# Patient Record
Sex: Female | Born: 1970 | Race: Black or African American | Hispanic: No | Marital: Single | Smoking: Never smoker
Health system: Southern US, Community
[De-identification: ages and names within clinical notes are randomized; demographics above are authoritative.]

## PROBLEM LIST (undated history)

## (undated) DIAGNOSIS — K589 Irritable bowel syndrome without diarrhea: Secondary | ICD-10-CM

## (undated) DIAGNOSIS — M069 Rheumatoid arthritis, unspecified: Secondary | ICD-10-CM

## (undated) DIAGNOSIS — I10 Essential (primary) hypertension: Secondary | ICD-10-CM

## (undated) DIAGNOSIS — E039 Hypothyroidism, unspecified: Secondary | ICD-10-CM

## (undated) DIAGNOSIS — R252 Cramp and spasm: Secondary | ICD-10-CM

## (undated) DIAGNOSIS — E669 Obesity, unspecified: Secondary | ICD-10-CM

## (undated) DIAGNOSIS — M722 Plantar fascial fibromatosis: Secondary | ICD-10-CM

## (undated) DIAGNOSIS — J45909 Unspecified asthma, uncomplicated: Secondary | ICD-10-CM

## (undated) DIAGNOSIS — T7840XA Allergy, unspecified, initial encounter: Secondary | ICD-10-CM

## (undated) DIAGNOSIS — E559 Vitamin D deficiency, unspecified: Secondary | ICD-10-CM

## (undated) DIAGNOSIS — K219 Gastro-esophageal reflux disease without esophagitis: Secondary | ICD-10-CM

## (undated) DIAGNOSIS — G43909 Migraine, unspecified, not intractable, without status migrainosus: Secondary | ICD-10-CM

## (undated) DIAGNOSIS — D649 Anemia, unspecified: Secondary | ICD-10-CM

## (undated) HISTORY — DX: Unspecified asthma, uncomplicated: J45.909

## (undated) HISTORY — DX: Plantar fascial fibromatosis: M72.2

## (undated) HISTORY — DX: Obesity, unspecified: E66.9

## (undated) HISTORY — DX: Essential (primary) hypertension: I10

## (undated) HISTORY — DX: Irritable bowel syndrome, unspecified: K58.9

## (undated) HISTORY — DX: Allergy, unspecified, initial encounter: T78.40XA

## (undated) HISTORY — DX: Rheumatoid arthritis, unspecified: M06.9

## (undated) HISTORY — DX: Cramp and spasm: R25.2

## (undated) HISTORY — DX: Migraine, unspecified, not intractable, without status migrainosus: G43.909

## (undated) HISTORY — DX: Anemia, unspecified: D64.9

## (undated) HISTORY — DX: Vitamin D deficiency, unspecified: E55.9

---

## 1998-06-14 ENCOUNTER — Other Ambulatory Visit: Admission: RE | Admit: 1998-06-14 | Discharge: 1998-06-14 | Payer: Self-pay | Admitting: Obstetrics & Gynecology

## 1999-06-12 ENCOUNTER — Other Ambulatory Visit: Admission: RE | Admit: 1999-06-12 | Discharge: 1999-06-12 | Payer: Self-pay | Admitting: Obstetrics and Gynecology

## 2000-06-11 ENCOUNTER — Other Ambulatory Visit: Admission: RE | Admit: 2000-06-11 | Discharge: 2000-06-11 | Payer: Self-pay | Admitting: *Deleted

## 2001-06-28 ENCOUNTER — Other Ambulatory Visit: Admission: RE | Admit: 2001-06-28 | Discharge: 2001-06-28 | Payer: Self-pay | Admitting: Obstetrics and Gynecology

## 2002-06-30 ENCOUNTER — Other Ambulatory Visit: Admission: RE | Admit: 2002-06-30 | Discharge: 2002-06-30 | Payer: Self-pay | Admitting: Obstetrics and Gynecology

## 2003-01-10 ENCOUNTER — Encounter: Payer: Self-pay | Admitting: Family Medicine

## 2003-01-10 ENCOUNTER — Encounter: Admission: RE | Admit: 2003-01-10 | Discharge: 2003-01-10 | Payer: Self-pay | Admitting: Family Medicine

## 2003-07-02 ENCOUNTER — Other Ambulatory Visit: Admission: RE | Admit: 2003-07-02 | Discharge: 2003-07-02 | Payer: Self-pay | Admitting: Obstetrics and Gynecology

## 2004-07-07 ENCOUNTER — Other Ambulatory Visit: Admission: RE | Admit: 2004-07-07 | Discharge: 2004-07-07 | Payer: Self-pay | Admitting: Obstetrics and Gynecology

## 2005-07-14 ENCOUNTER — Other Ambulatory Visit: Admission: RE | Admit: 2005-07-14 | Discharge: 2005-07-14 | Payer: Self-pay | Admitting: Family Medicine

## 2006-07-26 ENCOUNTER — Other Ambulatory Visit: Admission: RE | Admit: 2006-07-26 | Discharge: 2006-07-26 | Payer: Self-pay | Admitting: Family Medicine

## 2007-07-29 ENCOUNTER — Other Ambulatory Visit: Admission: RE | Admit: 2007-07-29 | Discharge: 2007-07-29 | Payer: Self-pay | Admitting: Family Medicine

## 2008-08-10 ENCOUNTER — Other Ambulatory Visit: Admission: RE | Admit: 2008-08-10 | Discharge: 2008-08-10 | Payer: Self-pay | Admitting: Family Medicine

## 2009-08-12 ENCOUNTER — Other Ambulatory Visit: Admission: RE | Admit: 2009-08-12 | Discharge: 2009-08-12 | Payer: Self-pay | Admitting: Family Medicine

## 2011-10-26 ENCOUNTER — Other Ambulatory Visit: Payer: Self-pay | Admitting: Family Medicine

## 2011-10-26 DIAGNOSIS — Z1231 Encounter for screening mammogram for malignant neoplasm of breast: Secondary | ICD-10-CM

## 2011-10-29 ENCOUNTER — Ambulatory Visit: Payer: Self-pay

## 2012-03-21 ENCOUNTER — Ambulatory Visit
Admission: RE | Admit: 2012-03-21 | Discharge: 2012-03-21 | Disposition: A | Discharge status: Home / Self Care | Payer: PRIVATE HEALTH INSURANCE | Source: Ambulatory Visit | Attending: Family Medicine | Admitting: Family Medicine

## 2012-03-21 DIAGNOSIS — Z1231 Encounter for screening mammogram for malignant neoplasm of breast: Secondary | ICD-10-CM

## 2012-08-22 ENCOUNTER — Other Ambulatory Visit (HOSPITAL_COMMUNITY)
Admission: RE | Admit: 2012-08-22 | Discharge: 2012-08-22 | Disposition: A | Discharge status: Home / Self Care | Payer: PRIVATE HEALTH INSURANCE | Source: Ambulatory Visit | Attending: Family Medicine | Admitting: Family Medicine

## 2012-08-22 ENCOUNTER — Other Ambulatory Visit: Payer: Self-pay | Admitting: Family Medicine

## 2012-08-22 DIAGNOSIS — Z0000 Encounter for general adult medical examination without abnormal findings: Secondary | ICD-10-CM | POA: Insufficient documentation

## 2013-10-09 ENCOUNTER — Other Ambulatory Visit: Payer: Self-pay

## 2013-10-09 DIAGNOSIS — Z1231 Encounter for screening mammogram for malignant neoplasm of breast: Secondary | ICD-10-CM

## 2014-02-19 ENCOUNTER — Other Ambulatory Visit: Payer: Self-pay | Admitting: Orthopedic Surgery

## 2014-02-19 DIAGNOSIS — M25511 Pain in right shoulder: Secondary | ICD-10-CM

## 2014-02-25 ENCOUNTER — Other Ambulatory Visit: Payer: PRIVATE HEALTH INSURANCE

## 2014-04-02 ENCOUNTER — Ambulatory Visit: Payer: PRIVATE HEALTH INSURANCE

## 2014-04-02 ENCOUNTER — Ambulatory Visit
Admission: RE | Admit: 2014-04-02 | Discharge: 2014-04-02 | Disposition: A | Discharge status: Home / Self Care | Payer: Self-pay | Source: Ambulatory Visit

## 2014-04-02 DIAGNOSIS — Z1231 Encounter for screening mammogram for malignant neoplasm of breast: Secondary | ICD-10-CM

## 2014-08-29 ENCOUNTER — Other Ambulatory Visit: Payer: Self-pay | Admitting: Family Medicine

## 2014-08-31 LAB — CYTOLOGY - PAP

## 2015-02-22 ENCOUNTER — Ambulatory Visit: Payer: Self-pay | Admitting: Physician Assistant

## 2015-03-12 ENCOUNTER — Ambulatory Visit: Payer: Self-pay

## 2015-03-19 ENCOUNTER — Ambulatory Visit: Payer: Self-pay

## 2015-03-22 ENCOUNTER — Ambulatory Visit (INDEPENDENT_AMBULATORY_CARE_PROVIDER_SITE_OTHER): Payer: PRIVATE HEALTH INSURANCE

## 2015-03-22 ENCOUNTER — Ambulatory Visit (INDEPENDENT_AMBULATORY_CARE_PROVIDER_SITE_OTHER): Payer: PRIVATE HEALTH INSURANCE | Admitting: Podiatry

## 2015-03-22 ENCOUNTER — Encounter: Payer: Self-pay | Admitting: Podiatry

## 2015-03-22 VITALS — BP 140/82 | HR 115 | Resp 16 | Ht 70.0 in | Wt 384.0 lb

## 2015-03-22 DIAGNOSIS — M722 Plantar fascial fibromatosis: Secondary | ICD-10-CM | POA: Diagnosis not present

## 2015-03-22 DIAGNOSIS — S9032XA Contusion of left foot, initial encounter: Secondary | ICD-10-CM

## 2015-03-22 MED ORDER — TRIAMCINOLONE ACETONIDE 10 MG/ML IJ SUSP
10.0000 mg | Freq: Once | INTRAMUSCULAR | Status: AC
Start: 2015-03-22 — End: 2015-03-22
  Administered 2015-03-22: 10 mg
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
## 2015-03-26 ENCOUNTER — Ambulatory Visit: Payer: Self-pay

## 2015-04-12 ENCOUNTER — Encounter: Payer: Self-pay | Admitting: Podiatry

## 2015-04-12 ENCOUNTER — Ambulatory Visit (INDEPENDENT_AMBULATORY_CARE_PROVIDER_SITE_OTHER): Payer: PRIVATE HEALTH INSURANCE | Admitting: Podiatry

## 2015-04-12 ENCOUNTER — Ambulatory Visit: Payer: Self-pay | Admitting: Podiatry

## 2015-04-12 VITALS — BP 136/74 | HR 86 | Resp 12

## 2015-04-12 DIAGNOSIS — S9032XA Contusion of left foot, initial encounter: Secondary | ICD-10-CM | POA: Diagnosis not present

## 2015-04-12 DIAGNOSIS — R609 Edema, unspecified: Secondary | ICD-10-CM

## 2015-04-12 DIAGNOSIS — M722 Plantar fascial fibromatosis: Secondary | ICD-10-CM
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
## 2015-05-17 ENCOUNTER — Ambulatory Visit (INDEPENDENT_AMBULATORY_CARE_PROVIDER_SITE_OTHER): Payer: PRIVATE HEALTH INSURANCE

## 2015-05-17 ENCOUNTER — Ambulatory Visit (INDEPENDENT_AMBULATORY_CARE_PROVIDER_SITE_OTHER): Payer: PRIVATE HEALTH INSURANCE | Admitting: Podiatry

## 2015-05-17 ENCOUNTER — Encounter: Payer: Self-pay | Admitting: Podiatry

## 2015-05-17 VITALS — BP 154/96 | HR 117 | Resp 15

## 2015-05-17 DIAGNOSIS — M722 Plantar fascial fibromatosis: Secondary | ICD-10-CM

## 2015-05-17 DIAGNOSIS — M779 Enthesopathy, unspecified: Secondary | ICD-10-CM | POA: Diagnosis not present

## 2015-05-17 DIAGNOSIS — S9032XA Contusion of left foot, initial encounter: Secondary | ICD-10-CM | POA: Diagnosis not present

## 2015-05-17 MED ORDER — TRIAMCINOLONE ACETONIDE 10 MG/ML IJ SUSP
10.0000 mg | Freq: Once | INTRAMUSCULAR | Status: AC
Start: 2015-05-17 — End: 2015-05-17
  Administered 2015-05-17: 10 mg

## 2015-05-17 NOTE — Patient Instructions (Signed)
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
## 2015-06-05 ENCOUNTER — Telehealth: Payer: Self-pay | Admitting: *Deleted

## 2015-06-05 NOTE — Telephone Encounter (Signed)
Pt states Dr. Paulla Dolly instructed her to gradually transfer into regular shoes with the orthotics, but she is still having a great deal of pain.  I instructed pt to wear the boot while outside of the house, and wear the orthotics in her shoes while at home, so if she needed to rest from the orthotics she would be able to sit.  I encouraged pt to ice the area 3 - 4 times a day for 10 - 15 minutes/daily and to call for a follow up appt if needed and to bring her orthotics.

## 2015-06-19 ENCOUNTER — Encounter: Payer: Self-pay | Admitting: Podiatry

## 2015-06-19 ENCOUNTER — Ambulatory Visit (INDEPENDENT_AMBULATORY_CARE_PROVIDER_SITE_OTHER): Payer: PRIVATE HEALTH INSURANCE | Admitting: Podiatry

## 2015-06-19 VITALS — BP 137/80 | HR 114 | Resp 15

## 2015-06-19 DIAGNOSIS — M722 Plantar fascial fibromatosis: Secondary | ICD-10-CM

## 2015-06-19 MED ORDER — TRIAMCINOLONE ACETONIDE 10 MG/ML IJ SUSP
10.0000 mg | Freq: Once | INTRAMUSCULAR | Status: AC
  Administered 2015-06-19: 10 mg

## 2015-06-19 MED ORDER — TRAMADOL HCL 50 MG PO TABS: 50.0000 mg | ORAL_TABLET | Freq: Three times a day (TID) | ORAL | Status: DC

## 2015-06-20 ENCOUNTER — Ambulatory Visit: Payer: Self-pay | Admitting: Podiatry

## 2015-06-21 ENCOUNTER — Ambulatory Visit: Payer: PRIVATE HEALTH INSURANCE | Admitting: Podiatry
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
## 2015-06-26 ENCOUNTER — Telehealth: Payer: Self-pay | Admitting: *Deleted

## 2015-06-26 NOTE — Telephone Encounter (Signed)
Pt request rx for shoes to accommodate her rx orthotic.  Dr. Paulla Dolly states pt is to wear orthotics in accommodating shoes for the functional support of her feet. Please dispense one pair of accommodating shoes.  Mailed to pt's home address.

## 2015-08-05 ENCOUNTER — Encounter: Payer: Self-pay | Admitting: Podiatry

## 2015-08-05 ENCOUNTER — Ambulatory Visit (INDEPENDENT_AMBULATORY_CARE_PROVIDER_SITE_OTHER): Payer: PRIVATE HEALTH INSURANCE | Admitting: Podiatry

## 2015-08-05 VITALS — BP 130/65 | HR 107 | Resp 16

## 2015-08-05 DIAGNOSIS — M722 Plantar fascial fibromatosis: Secondary | ICD-10-CM

## 2015-08-05 MED ORDER — TRIAMCINOLONE ACETONIDE 10 MG/ML IJ SUSP
10.0000 mg | Freq: Once | INTRAMUSCULAR | Status: AC
  Administered 2015-08-05: 10 mg

## 2015-08-05 NOTE — Patient Instructions (Signed)
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
## 2015-09-25 ENCOUNTER — Telehealth: Payer: Self-pay | Admitting: *Deleted

## 2015-09-25 DIAGNOSIS — M722 Plantar fascial fibromatosis: Secondary | ICD-10-CM

## 2015-09-25 NOTE — Telephone Encounter (Addendum)
Pt states she would like to try PT prior to considering surgery, her foot is killing her.  Dr. Mellody Drown orders called to pt and she prefers [facility] [phone], and fax [phone].  Pt states she can only go once a week, Dr. Paulla Dolly ordered 10 sessions.  Faxed to [facility].

## 2015-09-26 ENCOUNTER — Telehealth: Payer: Self-pay | Admitting: *Deleted

## 2015-09-26 NOTE — Telephone Encounter (Signed)
No problem. They can try ultrasound and ionopheresis where ever she wants to go

## 2015-09-26 NOTE — Telephone Encounter (Signed)
Faxed to [facility].

## 2015-10-04 ENCOUNTER — Other Ambulatory Visit: Payer: Self-pay | Admitting: Rheumatology

## 2015-10-04 DIAGNOSIS — M79672 Pain in left foot: Secondary | ICD-10-CM

## 2016-01-03 ENCOUNTER — Telehealth: Payer: Self-pay | Admitting: *Deleted

## 2016-01-03 NOTE — Telephone Encounter (Addendum)
Pt states Dr. Paulla Dolly said at the last visit that would be her last shot.  Pt states she does not want to come in if she can't get a shot.  I told pt I would ask Dr. Paulla Dolly on Monday and call again. 01/06/2016 - CALLED PT informed pt of Dr. Mellody Drown statement that he could possibly use a different injection site.  Pt states understanding and I transferred to schedulers.

## 2016-01-06 NOTE — Telephone Encounter (Signed)
I may be able to utilitze a different injection site. If still hurting I should see her

## 2016-01-13 ENCOUNTER — Ambulatory Visit (INDEPENDENT_AMBULATORY_CARE_PROVIDER_SITE_OTHER): Payer: Managed Care, Other (non HMO) | Admitting: Podiatry

## 2016-01-13 ENCOUNTER — Encounter: Payer: Self-pay | Admitting: Podiatry

## 2016-01-13 VITALS — BP 144/77 | HR 117 | Resp 16

## 2016-01-13 DIAGNOSIS — M722 Plantar fascial fibromatosis: Secondary | ICD-10-CM | POA: Diagnosis not present

## 2016-01-13 MED ORDER — TRIAMCINOLONE ACETONIDE 10 MG/ML IJ SUSP
10.0000 mg | Freq: Once | INTRAMUSCULAR | Status: AC
  Administered 2016-01-13: 10 mg

## 2016-01-14 NOTE — Progress Notes (Signed)
Subjective:     Patient ID: Brittany Riggs, female   DOB: [birth year], 45 y.o.   MRN: [id number]  HPI patient continues to experience pain in the plantar aspect left heel and states that it just doesn't seem to get better   Review of Systems     Objective:   Physical Exam Neurovascular status intact with extreme obesity and history of injury that has exacerbated a plantar fascial inflammation left heel with depression of the arch noted    Assessment:     Continued acute plantar fasciitis left    Plan:     Reviewed that we'll get a try everything but ultimately this may require shockwave or surgery and today I did careful last time medial injection of the plantar fascia and advised on night splint usage boot usage ice therapy oral anti-inflammatories and reduced activity.

## 2016-02-10 ENCOUNTER — Ambulatory Visit: Payer: Managed Care, Other (non HMO) | Admitting: Podiatry

## 2016-03-12 ENCOUNTER — Encounter: Payer: Self-pay | Admitting: Internal Medicine

## 2016-03-12 ENCOUNTER — Ambulatory Visit (INDEPENDENT_AMBULATORY_CARE_PROVIDER_SITE_OTHER): Payer: Managed Care, Other (non HMO) | Admitting: Internal Medicine

## 2016-03-12 ENCOUNTER — Ambulatory Visit (INDEPENDENT_AMBULATORY_CARE_PROVIDER_SITE_OTHER)
Admission: RE | Admit: 2016-03-12 | Discharge: 2016-03-12 | Disposition: A | Discharge status: Home / Self Care | Payer: Managed Care, Other (non HMO) | Source: Ambulatory Visit | Attending: Internal Medicine | Admitting: Internal Medicine

## 2016-03-12 ENCOUNTER — Encounter (INDEPENDENT_AMBULATORY_CARE_PROVIDER_SITE_OTHER): Payer: Self-pay

## 2016-03-12 VITALS — BP 124/84 | HR 122 | Ht 70.0 in | Wt >= 6400 oz

## 2016-03-12 DIAGNOSIS — R05 Cough: Secondary | ICD-10-CM

## 2016-03-12 DIAGNOSIS — R058 Other specified cough: Secondary | ICD-10-CM

## 2016-03-12 DIAGNOSIS — J45991 Cough variant asthma: Secondary | ICD-10-CM

## 2016-03-12 MED ORDER — TRAMADOL HCL 50 MG PO TABS: ORAL_TABLET | ORAL | Status: DC

## 2016-03-12 MED ORDER — MONTELUKAST SODIUM 10 MG PO TABS: 10.0000 mg | ORAL_TABLET | Freq: Every day | ORAL | Status: DC

## 2016-03-12 NOTE — Assessment & Plan Note (Addendum)
The most common causes of chronic cough in immunocompetent adults include the following: upper airway cough syndrome (UACS), previously referred to as postnasal drip syndrome (PNDS), which is caused by variety of rhinosinus conditions; (2) asthma; (3) GERD; (4) chronic bronchitis from cigarette smoking or other inhaled environmental irritants; (5) nonasthmatic eosinophilic bronchitis; and (6) bronchiectasis.   These conditions, singly or in combination, have accounted for up to 94% of the causes of chronic cough in prospective studies.   Other conditions have constituted no >6% of the causes in prospective studies These have included bronchogenic carcinoma, chronic interstitial pneumonia, sarcoidosis, left ventricular failure, ACEI-induced cough, and aspiration from a condition associated with pharyngeal dysfunction.    Chronic cough is often simultaneously caused by more than one condition. A single cause has been found from 38 to 82% of the time, multiple causes from 18 to 62%. Multiply caused cough has been the result of three diseases up to 42% of the time.       Based on hx and exam, this is most likely:  Classic Upper airway cough syndrome, so named because it's frequently impossible to sort out how much is  CR/sinusitis with freq throat clearing (which can be related to primary GERD)   vs  causing  secondary (" extra esophageal")  GERD from wide swings in gastric pressure that occur with throat clearing, often  promoting self use of mint and menthol lozenges that reduce the lower esophageal sphincter tone and exacerbate the problem further in a cyclical fashion.   These are the same pts (now being labeled as having "irritable larynx syndrome" by some cough centers) who not infrequently have a history of having failed to tolerate ace inhibitors,  dry powder inhalers or biphosphonates or report having atypical reflux symptoms that don't respond to standard doses of PPI , and are easily confused as  having aecopd or asthma flares by even experienced allergists/ pulmonologists.   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 Therefore if adherence is an issue or can't be accurately verified,  it's very unlikely the standard evaluation and treatment will be successful here.    Furthermore, response to therapy (other than acute cough suppression, which should only be used short term with avoidance of narcotic containing cough syrups if possible), can be a gradual process for which the patient may perceive immediate benefit.  Unlike going to an eye doctor where the best perscription is almost always the first one and is immediately effective, this is almost never the case in the management of chronic cough syndromes. Therefore the patient needs to commit up front to consistently adhere to recommendations  for up to 6 weeks of therapy directed at the likely underlying problem(s) before the response can be reasonably evaluated.  2) Each maintenance medication was reviewed in detail including most importantly the difference between maintenance and prns and under what circumstances the prns are to be triggered using an action plan format that is not reflected in the computer generated  alphabetically organized AVS.    Please see instructions for details which were reviewed in writing and the patient given a copy highlighting the part that I personally wrote and discussed at today's ov.   See instructions for specific recommendations which were reviewed directly with the patient who was given a copy with highlighter outlining the key components.
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
## 2016-03-12 NOTE — Patient Instructions (Addendum)
Stop cough syrup and all inhalers except for proair and only if can't catch your breath  Continue singulair 10 mg daily    The key to effective treatment for your cough is eliminating the non-stop cycle of cough you're stuck in long enough to let your airway heal completely and then see if there is anything still making you cough once you stop the cough suppression, but this should take no more than 5 days to figure out  First take delsym two tsp every 12 hours and supplement if needed with  tramadol 50 mg up to 2 every 4 hours to suppress the urge to cough at all or even clear your throat. Swallowing water or using ice chips/non mint and menthol containing candies (such as lifesavers or sugarless jolly ranchers) are also effective.  You should rest your voice and avoid activities that you know make you cough.  Once you have eliminated the cough for 3 straight days try reducing the tramadol first,  then the delsym as tolerated.    Prednisone  Per rheumatology   Omeprazole 40 mg  Take 30- 60 min before your first and last meals of the day  and Pepcid 20 mg one bedtime plus chlorpheniramine 4 mg x 2 at bedtime (both available over the counter)  until cough is completely gone for at least a week without the need for cough suppression  GERD (REFLUX)  is an extremely common cause of respiratory symptoms, many times with no significant heartburn at all.    It can be treated with medication, but also with lifestyle changes including avoidance of late meals, excessive alcohol, smoking cessation, and avoid fatty foods, chocolate, peppermint, colas, red wine, and acidic juices such as orange juice.  NO MINT OR MENTHOL PRODUCTS SO NO COUGH DROPS  USE HARD CANDY INSTEAD (jolley ranchers or Stover's or Lifesavers (all available in sugarless versions) NO OIL BASED VITAMINS - use powdered substitutes.  Please remember to go to the  x-ray department downstairs for your tests - we will call you with the results  when they are available.  Please schedule a follow up office visit in 2  weeks, sooner if needed

## 2016-03-13 ENCOUNTER — Telehealth: Payer: Self-pay | Admitting: Internal Medicine

## 2016-03-13 DIAGNOSIS — J45991 Cough variant asthma: Secondary | ICD-10-CM | POA: Insufficient documentation

## 2016-03-13 NOTE — Assessment & Plan Note (Signed)
Also in ddx though note neg resp to saba/laba/ics/steroids and cough on insp , not exp  Only meds for asthma that don't potentially aggravate uacs are singulair and prednisone so reasonable to resume singulair for now and take the pred taper already rx per rheum then regroup in 2 weeks  She can still use saba as rescue for breathing/ not for coughing.

## 2016-03-13 NOTE — Assessment & Plan Note (Signed)
Body mass index is 57.62    No results found for: TSH   Contributing to gerd tendency especially with bcp's in use/ doe/reviewed the need and the process to achieve and maintain neg calorie balance > defer f/u primary care including intermittently monitoring thyroid status

## 2016-03-13 NOTE — Telephone Encounter (Signed)
Per CXR result note: Result Note     Call pt: Reviewed cxr and no acute change so no change in recommendations made at ov  --  I spoke with patient about results and she verbalized understanding and had no questions

## 2016-03-19 ENCOUNTER — Ambulatory Visit (INDEPENDENT_AMBULATORY_CARE_PROVIDER_SITE_OTHER): Payer: Managed Care, Other (non HMO)

## 2016-03-19 ENCOUNTER — Ambulatory Visit (INDEPENDENT_AMBULATORY_CARE_PROVIDER_SITE_OTHER): Payer: Managed Care, Other (non HMO) | Admitting: Podiatry

## 2016-03-19 ENCOUNTER — Encounter: Payer: Self-pay | Admitting: Podiatry

## 2016-03-19 DIAGNOSIS — R600 Localized edema: Secondary | ICD-10-CM

## 2016-03-19 DIAGNOSIS — M79672 Pain in left foot: Secondary | ICD-10-CM

## 2016-03-20 NOTE — Progress Notes (Signed)
Subjective:     Patient ID: Brittany Riggs, female   DOB: 1971-01-12, 45 y.o.   MRN: [id number]  HPI extremely obese female presents with forefoot pain stating her heel seems to feel somewhat better but this is making it very hard for her to walk   Review of Systems     Objective:   Physical Exam Neurovascular status unchanged with edema in the forefoot left and into the ankle with negative Homans sign noted with patient having pain in the forefoot and moderate pain and he'll also still noted    Assessment:     Forefoot pain heel pain with edema present    Plan:     X-ray reviewed and today we can reduce the edema and I applied Unna boot surgical shoe and Ace wrap to help reduce edema. Reappoint for [place] to recheck again in 1 week or earlier if needed and she's instructed take this off at home in the next 3-4 days  X-ray report indicates no signs of stress fracture or arthritic process

## 2016-03-23 ENCOUNTER — Ambulatory Visit: Payer: Managed Care, Other (non HMO) | Admitting: Podiatry

## 2016-03-26 ENCOUNTER — Ambulatory Visit (INDEPENDENT_AMBULATORY_CARE_PROVIDER_SITE_OTHER): Payer: Managed Care, Other (non HMO) | Admitting: Podiatry

## 2016-03-26 ENCOUNTER — Encounter: Payer: Self-pay | Admitting: Podiatry

## 2016-03-26 ENCOUNTER — Ambulatory Visit (INDEPENDENT_AMBULATORY_CARE_PROVIDER_SITE_OTHER): Payer: Managed Care, Other (non HMO) | Admitting: Internal Medicine

## 2016-03-26 ENCOUNTER — Encounter: Payer: Self-pay | Admitting: Internal Medicine

## 2016-03-26 VITALS — BP 134/80 | HR 120 | Ht 70.0 in | Wt >= 6400 oz

## 2016-03-26 VITALS — BP 131/80 | HR 100 | Resp 12

## 2016-03-26 DIAGNOSIS — R058 Other specified cough: Secondary | ICD-10-CM

## 2016-03-26 DIAGNOSIS — J45991 Cough variant asthma: Secondary | ICD-10-CM | POA: Diagnosis not present

## 2016-03-26 DIAGNOSIS — R05 Cough: Secondary | ICD-10-CM

## 2016-03-26 DIAGNOSIS — M779 Enthesopathy, unspecified: Secondary | ICD-10-CM | POA: Diagnosis not present

## 2016-03-26 DIAGNOSIS — R600 Localized edema: Secondary | ICD-10-CM | POA: Diagnosis not present

## 2016-03-26 MED ORDER — METHYLPREDNISOLONE ACETATE 80 MG/ML IJ SUSP
120.0000 mg | Freq: Once | INTRAMUSCULAR | Status: AC
  Administered 2016-03-26: 120 mg via INTRAMUSCULAR

## 2016-03-26 MED ORDER — TRAMADOL HCL 50 MG PO TABS: ORAL_TABLET | ORAL | Status: DC

## 2016-03-26 MED ORDER — TRIAMCINOLONE ACETONIDE 10 MG/ML IJ SUSP
10.0000 mg | Freq: Once | INTRAMUSCULAR | Status: AC
  Administered 2016-03-26: 10 mg

## 2016-03-26 MED ORDER — MOMETASONE FURO-FORMOTEROL FUM 100-5 MCG/ACT IN AERO: INHALATION_SPRAY | RESPIRATORY_TRACT | Status: DC

## 2016-03-26 MED ORDER — FLUTTER DEVI: Status: AC

## 2016-03-26 NOTE — Progress Notes (Signed)
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 Helped but then around first Feb 2017 started wheezing and then  acutely ill with severe cough first week of cough and worse sob >  referred to pulmonary clinic 03/12/2016 by Dr Dema Severin   History of Present Illness  03/12/2016 1st [facility] office visit/ Wert   Chief Complaint  Patient presents with  . Pulmonary Consult    Referred by Dr. [name]. Pt states started wheezing 2 months ago. She began coughing 3 wks ago and was dxed with sinus infection.  Her cough is prod with minimal yellow sputum.  She is also having DOE with walking very short distances such as from lobby to exam room today.     Last fine 3 months prior to OV  While on No saba/ occ symbicort/ daily clariton and omeprazole 20 mg daily plus bcps but since 2 months prior to OV  subj wheeze/ sob/ cough only a little better with pred and not better with symbicort or dulera Coughs so hard gags/vomits/ urinates worse day than noct changed to dulera 200 one week prior to OV no better / also no better on codeine cough med and presently completing course of levaquin rec Stop cough syrup and all inhalers except for proair and only if can't catch your breath Continue singulair 10 mg daily  First take delsym two tsp every 12 hours and supplement if needed with  tramadol 50 mg up to 2 every 4 hours to suppress the urge to cough  Once you have eliminated the cough for 3 straight days try reducing the tramadol  first,  then the delsym as tolerated.   Prednisone  Per rheumatology  Omeprazole 40 mg  Take 30- 60 min before your first and last meals of the day  and Pepcid 20 mg one bedtime plus chlorpheniramine 4 mg x 2 at bedtime (both available over the counter)  until cough is completely gone for at least a week without the need for cough suppression GERD diet      03/26/2016  f/u ov/Wert re: refractory cough since early Feb 2017  Chief Complaint  Patient presents with  . Follow-up    Cough has improved minimally- prod with min yellow sputum.  Her breathing has not improved. She is still wheezing.   off pred x 10 days/ avg tramadol maybe 2 total per 24 h/ back on dulera 200 but not helping/ still cough to gag/vomit  No obvious day to day or daytime variability or assoc  cp or chest tightness, subjective wheeze or overt sinus   symptoms. No unusual exp hx or h/o childhood pna/ asthma or knowledge of premature birth.  Sleeping ok p tramadol without nocturnal  or early am exacerbation  of respiratory  c/o's or need for noct saba. Also denies any obvious fluctuation of symptoms with weather or environmental changes or other aggravating or alleviating factors except as outlined above   Current Medications, Allergies,  Complete Past Medical History, Past Surgical History, Family History, and Social History were reviewed in [employer] record.  ROS  The following are not active complaints unless bolded sore throat, dysphagia, dental problems, itching, sneezing,  nasal congestion or excess/ purulent secretions, ear ache,   fever, chills, sweats, unintended wt loss, classically pleuritic or exertional cp, hemoptysis,  orthopnea pnd or leg swelling, presyncope, palpitations, abdominal pain, anorexia, nausea, vomiting, diarrhea  or change in bowel or bladder habits, change in stools or urine, dysuria,hematuria,  rash, arthralgias, visual complaints, headache, numbness, weakness or ataxia or  problems with walking or coordination,  change in mood/affect or memory.                        Objective:   Physical Exam  Massively obese amb bf extremely harsh  upper airway coughing fits  03/26/2016        405   03/12/16 401 lb 9.6 oz (182.165 kg)  03/22/15 384 lb (174.181 kg)    Vital signs reviewed  HEENT: nl dentition, turbinates, and oropharynx. Nl external ear canals without cough reflex   NECK :  without JVD/Nodes/TM/ nl carotid upstrokes bilaterally   LUNGS: no acc muscle use,  Nl contour chest which is clear to A and P bilaterally with  cough only on  insp  maneuvers   CV:  RRR  no s3 or murmur or increase in P2, no edema   ABD:  soft and nontender with nl inspiratory excursion in the supine position. No bruits or organomegaly, bowel sounds nl  MS:  Nl gait/ ext warm without deformities, calf tenderness, cyanosis or clubbing No obvious joint restrictions   SKIN: warm and dry without lesions    NEURO:  alert, approp, nl sensorium with  no motor deficits    CXR PA and Lateral:   03/12/2016 :    I personally reviewed images and agree with radiology impression as follows:    slt increased markings/ non specific - no as dz      Assessment & Plan:   Outpatient Encounter Prescriptions as of 03/26/2016  Medication Sig  . Adalimumab 40 MG/0.8ML PNKT Inject into the skin. Reported on 03/12/2016  . albuterol (PROAIR HFA) 108 (90 Base) MCG/ACT inhaler Inhale 2 puffs into the lungs every 6 (six) hours as needed for wheezing or shortness of breath.  . celecoxib (CELEBREX) 200 MG capsule Take 200 mg by mouth 2 (two) times daily.  . chlorpheniramine (CHLOR-TRIMETON) 4 MG tablet Take 4 mg by mouth at bedtime.  Marland Kitchen dextromethorphan (DELSYM) 30 MG/5ML liquid as needed for cough.  . famotidine (PEPCID) 20 MG tablet Take 20 mg by mouth at bedtime.  Marland Kitchen loratadine (CLARITIN) 10 MG tablet Take 10 mg by mouth daily.  . montelukast (SINGULAIR) 10 MG tablet Take 1 tablet (10 mg  total) by mouth at bedtime.  Marland Kitchen omeprazole (PRILOSEC) 40 MG capsule Take 40 mg by mouth daily.  . [DISCONTINUED] Mometasone Furo-Formoterol Fum (DULERA IN) Inhale 2 puffs into the lungs daily as needed.  . [DISCONTINUED] traMADol (ULTRAM) 50 MG tablet One every 4 hours as needed for cough  . mometasone-formoterol (DULERA) 100-5 MCG/ACT AERO Take 2 puffs first thing in am and then another 2 puffs about 12 hours later.  Marland Kitchen Respiratory Therapy Supplies (FLUTTER) DEVI Use as directed  . traMADol (ULTRAM) 50 MG tablet 1-2 every 4 hours as needed for cough or pain  . [DISCONTINUED] LevoFLOXacin (LEVAQUIN PO) Take  1 tablet by mouth daily.  . [DISCONTINUED] predniSONE (DELTASONE) 20 MG tablet Take 20 mg by mouth as directed.  . [EXPIRED] methylPREDNISolone acetate (DEPO-MEDROL) injection 120 mg    No facility-administered encounter medications on file as of 03/26/2016.

## 2016-03-26 NOTE — Patient Instructions (Addendum)
dulera 100 Take 2 puffs first thing in am and then another 2 puffs about 12 hours later.   Continue singulair 10 mg daily   To control violent cough > cough into the flutter valve    The key to effective treatment for your cough is eliminating the non-stop cycle of cough you're stuck in long enough to let your airway heal completely and then see if there is anything still making you cough once you stop the cough suppression, but this should take no more than 5 days to figure out  First take delsym two tsp every 12 hours and supplement if needed with  tramadol 50 mg up to 2 every 4 hours to suppress the urge to cough at all or even clear your throat. Swallowing water or using ice chips/non mint and menthol containing candies (such as lifesavers or sugarless jolly ranchers) are also effective.  You should rest your voice and avoid activities that you know make you cough.  Once you have eliminated the cough for 3 straight days try reducing the tramadol first,  then the delsym as tolerated.    Depomedrol 120 mg IM   Omeprazole 40 mg  Take 30- 60 min before your first and last meals of the day  and Pepcid 20 mg one bedtime plus chlorpheniramine 4 mg x 2 at bedtime (both available over the counter)  until cough is completely gone for at least a week without the need for cough suppression  GERD (REFLUX)  is an extremely common cause of respiratory symptoms, many times with no significant heartburn at all.    It can be treated with medication, but also with lifestyle changes including avoidance of late meals, excessive alcohol, smoking cessation, and avoid fatty foods, chocolate, peppermint, colas, red wine, and acidic juices such as orange juice.  NO MINT OR MENTHOL PRODUCTS SO NO COUGH DROPS  USE HARD CANDY INSTEAD (jolley ranchers or Stover's or Lifesavers (all available in sugarless versions) NO OIL BASED VITAMINS - use powdered substitutes.    Please schedule a follow up office visit in 2  weeks, sooner if needed

## 2016-03-27 NOTE — Assessment & Plan Note (Addendum)
Cyclical cough rx [id number] > 03/26/2016 still coughing, using dulera 200/ cough drops and a total of 2 tramadol per day  - 03/26/2016 added flutter valve   I had an extended discussion with the patient reviewing all relevant studies completed to date and  lasting 15 to 20 minutes of a 25 minute visit on the following ongoing concerns:   1) this cough won't stop until it stops due to its cyclical nature:  Cough   initially  triggered by epithelial injury(doesn't really matter what that was at this point)  and a heightened sensitivty to the effects of any upper airway irritants,  most importantly acid - related - then perpetuated by epithelial injury related to the cough itself as the upper airway collapses on itself.  That is, the more sensitive the epithelium becomes once it is damaged by the virus, the more the ensuing irritability> the more the cough, the more the secondary reflux (especially in those prone to reflux) the more the irritation of the sensitive mucosa and so on in a  Classic cyclical pattern.  Key therefore is to stop coughing and only takes 3-5 days if will commit to it and in meantime always cough into the flutter valve   2) unlikely this is cough variant asthma but if it is needs to avoid high dose ics (see separate a/p)   3)  Each maintenance medication was reviewed in detail including most importantly the difference between maintenance and as needed and under what circumstances the prns are to be used.  Please see instructions for details which were reviewed in writing and the patient given a copy.

## 2016-03-27 NOTE — Assessment & Plan Note (Signed)
-   The proper method of use, as well as anticipated side effects, of a metered-dose inhaler are discussed and demonstrated to the patient. Improved effectiveness after extensive coaching during this visit to a level of approximately 75 % from a baseline of 50  %    If there is a component of cough variant asthma best approach is avoid meds that aggravate UACS ie high dose ICS > rec instead continuing singulair and low dose ICS eg dulera 100 2bid

## 2016-03-29 NOTE — Progress Notes (Signed)
Subjective:     Patient ID: Brittany Riggs, female   DOB: 09/16/1971, 45 y.o.   MRN: [id number]  HPI patient continues to experience discomfort in the lateral side of the left foot with moderate improvement from previous treatment with obesity as complicating factor   Review of Systems     Objective:   Physical Exam Neurovascular status unchanged with mild forefoot edema and continued discomfort in the lateral foot with profound obesity is complicating factor    Assessment:     H&P conditions reviewed and explained the tendinitis and condition she is experiencing    Plan:     Careful lateral sheath injection administered 3 mg Kenalog 5 mg Xylocaine and dispensed a ankle compression stocking to try to help control edema. Reappoint as needed

## 2016-04-01 ENCOUNTER — Ambulatory Visit: Payer: Managed Care, Other (non HMO) | Admitting: Podiatry

## 2016-04-09 ENCOUNTER — Telehealth: Payer: Self-pay | Admitting: *Deleted
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
## 2016-04-10 ENCOUNTER — Ambulatory Visit: Payer: Managed Care, Other (non HMO) | Admitting: Podiatry

## 2016-04-13 ENCOUNTER — Ambulatory Visit: Payer: Managed Care, Other (non HMO) | Admitting: Podiatry

## 2016-04-14 ENCOUNTER — Ambulatory Visit: Payer: Self-pay | Admitting: Internal Medicine

## 2016-04-15 ENCOUNTER — Ambulatory Visit (INDEPENDENT_AMBULATORY_CARE_PROVIDER_SITE_OTHER): Payer: Managed Care, Other (non HMO) | Admitting: Adult Health

## 2016-04-15 ENCOUNTER — Encounter: Payer: Self-pay | Admitting: Adult Health

## 2016-04-15 VITALS — BP 130/78 | HR 128 | Temp 98.1°F | Ht 70.0 in | Wt >= 6400 oz

## 2016-04-15 DIAGNOSIS — R058 Other specified cough: Secondary | ICD-10-CM

## 2016-04-15 DIAGNOSIS — R05 Cough: Secondary | ICD-10-CM | POA: Diagnosis not present

## 2016-04-15 DIAGNOSIS — J45991 Cough variant asthma: Secondary | ICD-10-CM

## 2016-04-15 NOTE — Assessment & Plan Note (Signed)
Refractory cough ? Etiology . She is on MTX and has RA . Will check CT chest and sinus to r/o other etiology  Check ESR and IgE /RAST  Continue agrressive cough and trigger control   Plan   Try to add Chlortrimeton 17m in am .  Continue on Chlortrimeton 417m2 At bedtime   Continue on Delsym .As needed  Cough .  Sips of water to avoid cough /throat clearing.  Use tramadol As needed  Cough control .  Change claritin to Zyrtec 1018mn am.  Restart QNASAL daily  GERD diet.  Continue on Prilosec 72m54mily  Continue on Pepcid 72mg70mbedtime   We are setting up CT chest and sinus .  Fllow up Dr. Wert Melvyn Novas4 weeks and As needed   Please contact office for sooner follow up if symptoms do not improve or worsen or seek emergency care

## 2016-04-15 NOTE — Assessment & Plan Note (Signed)
Cont on Dulera ,  Check spirometry once cough is better.  Consider FENO testing on return if cough is better.

## 2016-04-15 NOTE — Progress Notes (Signed)
Chart and office note reviewed in detail  > agree with a/p as outlined    

## 2016-04-15 NOTE — Patient Instructions (Signed)
Try to add Chlortrimeton 4mg  in am .  Continue on Chlortrimeton 4mg  2 At bedtime   Continue on Delsym .As needed  Cough .  Sips of water to avoid cough /throat clearing.  Use tramadol As needed  Cough control .  Change claritin to Zyrtec 10mg  in am.  Restart QNASAL daily  GERD diet.  Continue on Prilosec 20mg  daily  Continue on Pepcid 20mg  At bedtime   We are setting up CT chest and sinus .  Fllow up Dr. Melvyn Novas  In 4 weeks and As needed   Please contact office for sooner follow up if symptoms do not improve or worsen or seek emergency care

## 2016-04-15 NOTE — Progress Notes (Signed)
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 Helped but then around first Feb 2017 started wheezing and then  acutely ill with severe cough first week of cough and worse sob >  referred to pulmonary clinic 03/12/2016 by Dr Dema Severin   History of Present Illness  03/12/2016 1st [facility] office visit/ W[facility]   Chief Complaint  Patient presents with  . Pulmonary Consult    Referred by Dr. [name]. Pt states started wheezing 2 months ago. She began coughing 3 wks ago and was dxed with sinus infection.  Her cough is prod with minimal yellow sputum.  She is also having DOE with walking very short distances such as from lobby to exam room today.     Last fine 3 months prior to OV  While on No saba/ occ symbicort/ daily clariton and omeprazole 20 mg daily plus bcps but since 2 months prior to OV  subj wheeze/ sob/ cough only a little better with pred and not better with symbicort or dulera Coughs so hard gags/vomits/ urinates worse day than noct changed to dulera 200 one week prior to OV no better / also no better on codeine cough med and presently completing course of levaquin rec Stop cough syrup and all inhalers except for proair and only if can't catch your breath Continue singulair 10 mg daily  First take delsym two tsp every 12 hours and supplement if needed with  tramadol 50 mg up to 2 every 4 hours to suppress the urge to cough  Once you have eliminated the cough for 3 straight days try reducing the tramadol  first,  then the delsym as tolerated.   Prednisone  Per rheumatology  Omeprazole 40 mg  Take 30- 60 min before your first and last meals of the day  and Pepcid 20 mg one bedtime plus chlorpheniramine 4 mg x 2 at bedtime (both available over the counter)  until cough is completely gone for at least a week without the need for cough suppression GERD diet      03/26/2016  f/u ov/Wert re: refractory cough since early Feb 2017  Chief Complaint  Patient presents with  . Follow-up    Cough has improved minimally- prod with min yellow sputum.  Her breathing has not improved. She is still wheezing.   off pred x 10 days/ avg tramadol maybe 2 total per 24 h/ back on dulera 200 but not helping/ still cough to gag/vomit >>Depo medrol x 1 , chronic cough control with delsym/tramadol /flutter    04/15/2016 Follow up : Refractory Cough  Returns for a 2 week follow up for refractory cough .  Seen in March for pulmonary consult for refractory cough since Feb that started after URI/sinus infection initially .  Has had 4 abx , 2 steroid tapers and steroid injection . She was started  [facility] pt here for a 3 week follow up. Pt c/o prod cough with occasional yellow mucus and SOB  due to cough. Sinus drainage, ear pain, chest tightness, and wheezing at times. Denies any sinus pressure, fever, nausea or vomiting.  On Humira for RA.  On Methotrexate weekly.  Is on Benicar, does not believe she has been on ACE inhibitor in past.  Currently on delsym, tramadol -mainly taking at night , claritin daily , and Chrlor trimeton At bedtime . Due sleepiness.  Works as English as a second language teacher for years. Some chemical exposure .  Denies hemoptysis , chest pain, orthopnea, edema or fever.      Current Medications, Allergies, Complete Past Medical History, Past Surgical History, Family History, and Social History were reviewed in [facility] record.  ROS  The following are not active complaints unless bolded sore throat,  dysphagia, dental problems, itching, sneezing,  nasal congestion or excess/ purulent secretions, ear ache,   fever, chills, sweats, unintended wt loss, classically pleuritic or exertional cp, hemoptysis,  orthopnea pnd or leg swelling, presyncope, palpitations, abdominal pain, anorexia, nausea, vomiting, diarrhea  or change in bowel or bladder habits, change in stools or urine, dysuria,hematuria,  rash, arthralgias, visual complaints, headache, numbness, weakness or ataxia or problems with walking or coordination,  change in mood/affect or memory.              Objective:   Physical Exam  Massively obese amb bf extremely harsh  upper airway coughing fits Filed Vitals:   04/15/16 0946  BP: 130/78  Pulse: 128  Temp: 98.1 F (36.7 C)  TempSrc: Oral  Height: 5\' 10"  (1.778 m)  Weight: 404 lb (183.253 kg)  SpO2: 98%   Body mass index is 57.97 kg/(m^2).   Vital signs reviewed  HEENT: nl dentition, turbinates, and oropharynx. Nl external ear canals without cough reflex   NECK :  without JVD/Nodes/TM/ nl carotid upstrokes bilaterally   LUNGS: no acc muscle use,  Nl contour chest which is clear to A and P bilaterally with ++barking cough    CV:  RRR  no s3 or murmur or increase in P2, no edema   ABD:  soft and nontender with nl inspiratory excursion in the supine position. No bruits or organomegaly, bowel sounds nl  MS:  Nl gait/ ext warm without deformities, calf tenderness, cyanosis or clubbing No obvious joint restrictions   SKIN: warm and dry without lesions    NEURO:  alert, approp, nl sensorium with  no motor deficits    CXR PA and Lateral:   03/12/2016 :      slt increased markings/ non specific - no as dz     .Tammy Parrett NP-C  [facility]  04/15/2016

## 2016-04-16 ENCOUNTER — Ambulatory Visit: Payer: Managed Care, Other (non HMO) | Admitting: Podiatry

## 2016-04-17 ENCOUNTER — Ambulatory Visit (INDEPENDENT_AMBULATORY_CARE_PROVIDER_SITE_OTHER)
Admission: RE | Admit: 2016-04-17 | Discharge: 2016-04-17 | Disposition: A | Discharge status: Home / Self Care | Payer: Managed Care, Other (non HMO) | Source: Ambulatory Visit | Attending: Adult Health | Admitting: Adult Health

## 2016-04-17 ENCOUNTER — Ambulatory Visit (INDEPENDENT_AMBULATORY_CARE_PROVIDER_SITE_OTHER): Payer: Managed Care, Other (non HMO) | Admitting: Podiatry

## 2016-04-17 ENCOUNTER — Ambulatory Visit: Payer: Self-pay

## 2016-04-17 ENCOUNTER — Encounter: Payer: Self-pay | Admitting: Podiatry

## 2016-04-17 ENCOUNTER — Telehealth: Payer: Self-pay | Admitting: Adult Health

## 2016-04-17 VITALS — BP 131/90 | HR 120 | Resp 16

## 2016-04-17 DIAGNOSIS — M79672 Pain in left foot: Secondary | ICD-10-CM

## 2016-04-17 DIAGNOSIS — M25572 Pain in left ankle and joints of left foot: Secondary | ICD-10-CM

## 2016-04-17 DIAGNOSIS — R05 Cough: Secondary | ICD-10-CM

## 2016-04-17 DIAGNOSIS — R058 Other specified cough: Secondary | ICD-10-CM

## 2016-04-17 DIAGNOSIS — M722 Plantar fascial fibromatosis: Secondary | ICD-10-CM | POA: Diagnosis not present

## 2016-04-17 MED ORDER — TRIAMCINOLONE ACETONIDE 10 MG/ML IJ SUSP
10.0000 mg | Freq: Once | INTRAMUSCULAR | Status: AC
  Administered 2016-04-17: 10 mg

## 2016-04-17 NOTE — Telephone Encounter (Signed)
Notes Recorded by Melvenia Needles, NP on 04/17/2016 at 11:55 AM No sign of sinus infection  Very mild nasal septal deviation .  Cont w/ ov recs  Please contact office for sooner follow up if symptoms do not improve or worsen or seek emergency care   Called spoke with pt. Reviewed results and recs. Pt voiced understanding and had no further questions. Nothing further needed.

## 2016-04-17 NOTE — Progress Notes (Signed)
Subjective:     Patient ID: Brittany Riggs, female   DOB: 1971-04-21, 45 y.o.   MRN: [id number]  HPI patient states that the outside of my ankle is doing pretty good but I'm having quite a bit of pain in my plantar heel. She is obese which is a complicating factor   Review of Systems     Objective:   Physical Exam Neurovascular status intact muscle strength adequate with exquisite discomfort plantar aspect left heel at the insertional point tendon into the calcaneus with minimal discomfort lateral foot    Assessment:     Improved lateral band tendinitis left with plantar fascial symptomatology noted    Plan:     H&P and condition reviewed with patient. Today I went ahead and injected the plantar fascial left 3 mg Kenalog 5 mg Xylocaine and applied fascial brace

## 2016-04-17 NOTE — Progress Notes (Signed)
Quick Note:  Called spoke with pt. Reviewed results and recs. Pt voiced understanding and had no further questions. ______ 

## 2016-04-17 NOTE — Progress Notes (Signed)
Quick Note:  LVM for pt to return call ______ 

## 2016-04-22 ENCOUNTER — Telehealth: Payer: Self-pay | Admitting: Adult Health

## 2016-04-22 NOTE — Progress Notes (Signed)
Quick Note:  LVM for pt to return call ______ 

## 2016-04-22 NOTE — Progress Notes (Signed)
Quick Note:  See 04/22/16 phone note ______

## 2016-04-22 NOTE — Telephone Encounter (Signed)
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      Will need to continue to follow If symptoms persist.     Did show multinodular goiter and thyroid nodule , need to see PCP to see if additional testing is indicated    Spoke with pt and notified of results per Dr. Melvyn Novas. Pt verbalized understanding and denied any questions. She will follow with PCP- Dr Dema Severin  I have faxed her a copy of the ct report

## 2016-04-27 ENCOUNTER — Ambulatory Visit: Payer: Self-pay | Admitting: Internal Medicine

## 2016-04-30 ENCOUNTER — Encounter: Payer: Self-pay | Admitting: Podiatry

## 2016-04-30 ENCOUNTER — Ambulatory Visit (INDEPENDENT_AMBULATORY_CARE_PROVIDER_SITE_OTHER): Payer: Managed Care, Other (non HMO) | Admitting: Podiatry

## 2016-04-30 VITALS — BP 128/74 | HR 80 | Resp 16

## 2016-04-30 DIAGNOSIS — M722 Plantar fascial fibromatosis: Secondary | ICD-10-CM | POA: Diagnosis not present

## 2016-04-30 NOTE — Patient Instructions (Signed)

## 2016-04-30 NOTE — Progress Notes (Signed)
Subjective:     Patient ID: Brittany Riggs, female   DOB: [birth year], 45 y.o.   MRN: [id number]  HPI patient presents stating that she cannot take the pain in her heel and she needs something long-term to be done   Review of Systems     Objective:   Physical Exam Neurovascular status intact muscle strength adequate with continued discomfort plantar aspect left heel at the insertional point of the tendon into the calcaneus with severe obesity and flatfoot deformity as a part of this process    Assessment:     Chronic plantar fasciitis of the left heel that has failed to respond to numerous conservative treatments including complete immobilization injection treatment anti-inflammatories and support therapy    Plan:     H&P and condition reviewed with patient. I do think at this point were to need to consider more aggressive treatment she can't be active and that leads to further weight gain. I explained this to her and at this time I have recommended either shockwave therapy or open cutting surgery and I reviewed the differences between this and the risk factors associated with each and especially the risk factors associated with open surgery. She wants to make a decision at home but would like not to have another co-pay so I did allow her to do a surgical consent form today reviewing alternative treatments complications associated with this procedure. Patient signed consent form after extensive review understanding recovery can take from 6 months to one year with no long-term guarantees and is encouraged to call us with questions and to decide which way she wants to go

## 2016-05-06 ENCOUNTER — Ambulatory Visit (INDEPENDENT_AMBULATORY_CARE_PROVIDER_SITE_OTHER): Payer: Managed Care, Other (non HMO)

## 2016-05-06 DIAGNOSIS — M722 Plantar fascial fibromatosis: Secondary | ICD-10-CM

## 2016-05-06 DIAGNOSIS — B351 Tinea unguium: Secondary | ICD-10-CM

## 2016-05-07 NOTE — Progress Notes (Signed)
   Subjective:    Patient ID: Brittany Riggs, female    DOB: 21-Jul-1971, 45 y.o.   MRN: [id number]  HPI Pt presents with continue plantar fascial pain in the medial arch area and mid heel of her Lt foot   Review of Systems    All other systems negative Objective:   Physical Exam  Pain upon palpation of the left foot medial and mid facial band/heel      Assessment & Plan:  ESWR treatment administered to Lt heel at 14.14 joules 0.20 energy for 3000 pulses. EPAT therapy administered to surrounding connective tissues for 3000 pulses. Pt tolerated procedure well, with intermittent pain from 4-7. Advised against use of NSAIDs and ice, re-appointed in 1 week for 2nd ESWR treatment

## 2016-05-13 ENCOUNTER — Ambulatory Visit: Payer: Managed Care, Other (non HMO)

## 2016-05-13 DIAGNOSIS — M722 Plantar fascial fibromatosis: Secondary | ICD-10-CM

## 2016-05-14 NOTE — Progress Notes (Signed)
   Subjective:    Patient ID: Brittany Riggs, female    DOB: 12/25/1970, 45 y.o.   MRN: [id number]  HPI Pt presents with continue plantar fascial pain in the medial arch area and mid heel of her Lt foot, with some improvement noted  Review of Systems    All other systems negative Objective:   Physical Exam  Pain upon palpation of the left foot medial and mid facial band/heel, swelling decreased      Assessment & Plan:  ESWT treatment administered to Lt heel at 20  joules 0.35 energy for 3000 pulses. EPAT therapy administered to surrounding connective tissues for 3000 pulses. Pt tolerated procedure well, with intermittent pain from 4-7. Advised against use of NSAIDs and ice, re-appointed in 1 week for 3rd ESWT treatment

## 2016-05-15 ENCOUNTER — Encounter: Payer: Self-pay | Admitting: Internal Medicine

## 2016-05-15 ENCOUNTER — Other Ambulatory Visit (INDEPENDENT_AMBULATORY_CARE_PROVIDER_SITE_OTHER): Payer: 59

## 2016-05-15 ENCOUNTER — Ambulatory Visit (INDEPENDENT_AMBULATORY_CARE_PROVIDER_SITE_OTHER): Payer: 59 | Admitting: Internal Medicine

## 2016-05-15 VITALS — BP 152/84 | HR 126 | Ht 70.0 in | Wt >= 6400 oz

## 2016-05-15 DIAGNOSIS — R058 Other specified cough: Secondary | ICD-10-CM

## 2016-05-15 DIAGNOSIS — R05 Cough: Secondary | ICD-10-CM

## 2016-05-15 LAB — CBC WITH DIFFERENTIAL/PLATELET
Basophils Absolute: 0.1 10*3/uL (ref 0.0–0.1)
Basophils Relative: 0.9 % (ref 0.0–3.0)
Eosinophils Absolute: 0.1 10*3/uL (ref 0.0–0.7)
Eosinophils Relative: 0.9 % (ref 0.0–5.0)
HCT: 38.3 % (ref 36.0–46.0)
Hemoglobin: 12.7 g/dL (ref 12.0–15.0)
Lymphocytes Relative: 35.5 % (ref 12.0–46.0)
Lymphs Abs: 4.1 10*3/uL — ABNORMAL HIGH (ref 0.7–4.0)
MCHC: 33.2 g/dL (ref 30.0–36.0)
MCV: 90.9 fl (ref 78.0–100.0)
Monocytes Absolute: 0.6 10*3/uL (ref 0.1–1.0)
Monocytes Relative: 5.3 % (ref 3.0–12.0)
Neutro Abs: 6.6 10*3/uL (ref 1.4–7.7)
Neutrophils Relative %: 57.4 % (ref 43.0–77.0)
Platelets: 382 10*3/uL (ref 150.0–400.0)
RBC: 4.22 Mil/uL (ref 3.87–5.11)
RDW: 14.6 % (ref 11.5–15.5)
WBC: 11.5 10*3/uL — ABNORMAL HIGH (ref 4.0–10.5)

## 2016-05-15 LAB — SEDIMENTATION RATE: Sed Rate: 73 mm/hr — ABNORMAL HIGH (ref 0–20)

## 2016-05-15 LAB — NITRIC OXIDE: Nitric Oxide: 12

## 2016-05-15 MED ORDER — TRAMADOL HCL 50 MG PO TABS: ORAL_TABLET | ORAL | Status: AC

## 2016-05-15 MED ORDER — METHYLPREDNISOLONE ACETATE 80 MG/ML IJ SUSP
120.0000 mg | Freq: Once | INTRAMUSCULAR | Status: AC
  Administered 2016-05-15: 120 mg via INTRAMUSCULAR

## 2016-05-15 MED ORDER — GABAPENTIN 100 MG PO CAPS: 100.0000 mg | ORAL_CAPSULE | Freq: Three times a day (TID) | ORAL | Status: DC

## 2016-05-15 MED ORDER — PREDNISONE 10 MG PO TABS: ORAL_TABLET | ORAL | Status: DC

## 2016-05-15 NOTE — Progress Notes (Signed)
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 When ?  Helped but then around first Feb 2017 started wheezing and then  acutely ill with severe cough first week of cough and worse sob >  referred to pulmonary clinic 03/12/2016 by Dr Dema Severin   History of Present Illness  03/12/2016 1st [facility] office visit/ Jvon Meroney  symbicort  Chief Complaint  Patient presents with  . Pulmonary Consult    Referred by Dr. [name]. Pt states started wheezing 2 months ago. She began coughing 3 wks ago and was dxed with sinus infection.  Her cough is prod with minimal yellow sputum.  She is also having DOE with walking very short distances such as from lobby to exam room today.   Last fine 3 months prior to OV  While on No saba/ occ symbicort/ daily clariton and omeprazole 20 mg daily plus bcps but since 2 months prior to OV  subj wheeze/ sob/ cough only a little better with pred and not better with symbicort or dulera Coughs so hard gags/vomits/ urinates worse day than noct changed to dulera 200 one week prior to OV no better / also no better on codeine cough med and presently completing course of levaquin rec Stop cough syrup and all inhalers except for proair and only if can't catch your breath Continue singulair 10 mg daily  First take delsym two tsp every 12 hours and supplement if needed with  tramadol 50 mg up to 2 every 4 hours to suppress the urge to cough  Once you have eliminated the cough for 3 straight days try  reducing the tramadol first,  then the delsym as tolerated.   Prednisone  Per rheumatology  Omeprazole 40 mg  Take 30- 60 min before your first and last meals of the day  and Pepcid 20 mg one bedtime plus chlorpheniramine 4 mg x 2 at bedtime (both available over the counter)  until cough is completely gone for at least a week without the need for cough suppression GERD diet      03/26/2016  f/u ov/Zyier Dykema re: refractory cough since early Feb 2017  Chief Complaint  Patient presents with  . Follow-up    Cough has improved minimally- prod with min yellow sputum.  Her breathing has not improved. She is still wheezing.   off pred x 10 days/ avg tramadol maybe 2 total per 24 h/ back on dulera 200 but not helping/ still cough to gag/vomit dulera 100 Take 2 puffs first thing in am and then another 2 puffs about 12 hours later.  Continue singulair 10 mg daily  To control violent cough > cough into the flutter valve  First take delsym two tsp every 12 hours and supplement if needed with  tramadol 50 mg up to 2 every 4 hours to suppress the urge to cough at all or even clear your throat. Swallowing water or using ice chips/non mint and menthol containing candies (such as  lifesavers or sugarless jolly ranchers) are also effective.  You should rest your voice and avoid activities that you know make you cough. Once you have eliminated the cough for 3 straight days try reducing the tramadol first,  then the delsym as tolerated.   Depomedrol 120 mg IM  Omeprazole 40 mg  Take 30- 60 min before your first and last meals of the day  and Pepcid 20 mg one bedtime plus chlorpheniramine 4 mg x 2 at bedtime (both available over the counter)  until cough is completely gone for at least a week without the need for cough suppression GERD diet    04/15/2016 NP Follow up : Refractory Cough  Returns for a 2 week follow up for refractory cough .  Seen in March for pulmonary consult for refractory cough since Feb that started  after URI/sinus infection initially .  Has had 4 abx , 2 steroid tapers and steroid injection . She was started  [facility] pt here for a 3 week follow up. Pt c/o prod cough with occasional yellow mucus and SOB due to cough. Sinus drainage, ear pain, chest tightness, and wheezing at times. Denies any sinus pressure, fever, nausea or vomiting.  On Humira for RA.  On Methotrexate weekly.  Is on Benicar, does not believe she has been on ACE inhibitor in past.  Currently on delsym, tramadol -mainly taking at night , claritin daily , and Chrlor trimeton At bedtime . Due sleepiness.  Works as English as a second language teacher for years. Some chemical exposure .  rec Try to add Chlortrimeton 4mg  in am .  Continue on Chlortrimeton 4mg  2 At bedtime   Continue on Delsym .As needed  Cough .  Sips of water to avoid cough /throat clearing.  Use tramadol As needed  Cough control .  Change claritin to Zyrtec 10mg  in am.  Restart QNASAL daily  GERD diet.  Continue on Prilosec 20mg  daily  Continue on Pepcid 20mg  At bedtime   We are setting up CT chest and sinus> neg  05/15/2016  Extended  f/u ov/Kani Jobson re: cough x around 11/2015  Chief Complaint  Patient presents with  . Follow-up    4 week follow up - reports cough is 75% improved since last ov, still having yellow mucus and PND  only time she can control the cough is with tramadol but can't afford to be sleepy/ not really using flutter valve / no better with dulera   No obvious day to day or daytime variability or assoc  cp or chest tightness, subjective wheeze or overt sinus or hb symptoms. No unusual exp hx or h/o childhood pna/ asthma or knowledge of premature birth.  Sleeping ok only p tramadol without nocturnal  or early am exacerbation  of respiratory  c/o's or need for noct saba. Also denies any obvious fluctuation of symptoms with weather or environmental changes or other aggravating or alleviating factors except as outlined above   Current Medications, Allergies, Complete Past  Medical History, Past Surgical History, Family History, and Social History were reviewed in [facility] record.  ROS  The following are not active complaints unless bolded sore throat, dysphagia, dental problems, itching, sneezing,  nasal congestion or excess/ purulent secretions, ear ache,   fever, chills, sweats, unintended wt loss, classically pleuritic or exertional cp, hemoptysis,  orthopnea pnd or leg swelling, presyncope, palpitations, abdominal pain, anorexia, nausea, vomiting, diarrhea  or change in bowel or bladder habits, change in stools or urine, dysuria,hematuria,  rash, arthralgias, visual  complaints, headache, numbness, weakness or ataxia or problems with walking or coordination,  change in mood/affect or memory.               Objective:   Physical Exam  Massively obese amb bf extremely harsh  upper airway coughing fits/ no flutter      Wt Readings from Last 3 Encounters:  05/15/16 406 lb 3.2 oz (184.251 kg)  04/15/16 404 lb (183.253 kg)  03/26/16 405 lb (183.707 kg)    Vital signs reviewed       HEENT: nl dentition, turbinates, and oropharynx. Nl external ear canals without cough reflex   NECK :  without JVD/Nodes/TM/ nl carotid upstrokes bilaterally   LUNGS: no acc muscle use,  Nl contour chest which is clear to A and P bilaterally with ++barking cough    CV:  RRR  no s3 or murmur or increase in P2, no edema   ABD:  soft and nontender with nl inspiratory excursion in the supine position. No bruits or organomegaly, bowel sounds nl  MS:  Nl gait/ ext warm without deformities, calf tenderness, cyanosis or clubbing No obvious joint restrictions   SKIN: warm and dry without lesions    NEURO:  alert, approp, nl sensorium with  no motor deficits

## 2016-05-15 NOTE — Patient Instructions (Addendum)
Please remember to go to the lab  department downstairs for your tests - we will call you with the results when they are available.  Take delsym two tsp every 12 hours and supplement if needed with  tramadol 50 mg up to 2 every 4 hours to suppress the urge to cough. Swallowing water or using ice chips/non mint and menthol containing candies (such as lifesavers or sugarless jolly ranchers) are also effective.  You should rest your voice and avoid activities that you know make you cough.  Once you have eliminated the cough for 3 straight days try reducing the tramadol first,  then the delsym as tolerated.    depomedrol 120 mg IM / try off dulera   Cough into the flutter valve   Start gabapentin 100 mg three times a day

## 2016-05-16 NOTE — Assessment & Plan Note (Signed)
Cyclical cough rx [id number] > 03/26/2016 still coughing, using dulera 200/ cough drops and a total of 2 tramadol per day  - 03/26/2016 added flutter valve  -CT sinus 04/17/2016 >neg  CT chest 04/17/2016 >No convincing findings of interstitial lung disease. Minimal patchy subpleural reticulation and ground-glass attenuation and subpleural line in the right lower lobe, cannot entirely exclude the possibility that this represents minimal findings of nonspecific interstitial pneumonia (NSIP).  Mild multinodular goiter with dominant 1.4 cm left thyroid lobe nodule.>refer to PCP for thyroid 04/21/2016  - FENO while symptomatic 05/15/2016  = 12 on dulera 100 > try off and started gabapentin   Of the three most common causes of chronic cough, only one (GERD)  can actually cause the other two (asthma and post nasal drip syndrome)  and perpetuate the cylce of cough inducing airway trauma, inflammation, heightened sensitivity to reflux which is prompted by the cough itself via a cyclical mechanism.    This may partially respond to steroids and look like asthma and post nasal drainage but never erradicated completely unless the cough and the secondary reflux are eliminated, preferably both at the same time.  While not intuitively obvious, many patients with chronic low grade reflux do not cough until there is a secondary insult that disturbs the protective epithelial barrier and exposes sensitive nerve endings.  This can be viral or direct physical injury such as with an endotracheal tube.   The point is that once this occurs, it is difficult to eliminate using anything but a maximally effective acid suppression regimen at least in the short run, accompanied by an appropriate diet to address non acid GERD, and stopping all coughing because it generates more gerd, esp in the morbidly obese.  Discussed in detail all the  indications, usual  risks and alternatives  relative to the benefits with patient who agrees to proceed  with trial of gabapentin/ max gerd rx  I had an extended discussion with the patient reviewing all relevant studies completed to date and  lasting 25 minutes of a 40  minute extended office  visit    Each maintenance medication was reviewed in detail including most importantly the difference between maintenance and prns and under what circumstances the prns are to be triggered using an action plan format that is not reflected in the computer generated alphabetically organized AVS.    Please see instructions for details which were reviewed in writing and the patient given a copy highlighting the part that I personally wrote and discussed at today's ov.

## 2016-05-18 LAB — RESPIRATORY ALLERGY PROFILE REGION II ~~LOC~~
Allergen, Cedar tree, t12: <0.1 kU/L
Allergen, Comm Silver Birch, t9: 0.28 kU/L — ABNORMAL HIGH
Allergen, Cottonwood, t14: <0.1 kU/L
Allergen, D pternoyssinus,d7: 0.35 kU/L — ABNORMAL HIGH
Allergen, Mouse Urine Protein, e78: <0.1 kU/L
Allergen, Mulberry, t76: <0.1 kU/L
Allergen, Oak,t7: 0.71 kU/L — ABNORMAL HIGH
Alternaria Alternata: <0.1 kU/L
Aspergillus fumigatus, m3: <0.1 kU/L
Bermuda Grass: 0.24 kU/L — ABNORMAL HIGH
Box Elder IgE: 0.25 kU/L — ABNORMAL HIGH
Cat Dander: 0.32 kU/L — ABNORMAL HIGH
Cladosporium Herbarum: <0.1 kU/L
Cockroach: <0.1 kU/L
Common Ragweed: 1.35 kU/L — ABNORMAL HIGH
D. farinae: 0.36 kU/L — ABNORMAL HIGH
Dog Dander: <0.1 kU/L
Elm IgE: 0.16 kU/L — ABNORMAL HIGH
IgE (Immunoglobulin E), Serum: 95 kU/L (ref <115)
Johnson Grass: 0.24 kU/L — ABNORMAL HIGH
Pecan/Hickory Tree IgE: 2.78 kU/L — ABNORMAL HIGH
Penicillium Notatum: <0.1 kU/L
Rough Pigweed  IgE: <0.1 kU/L
Sheep Sorrel IgE: <0.1 kU/L
Timothy Grass: 0.27 kU/L — ABNORMAL HIGH

## 2016-05-20 ENCOUNTER — Ambulatory Visit: Payer: Managed Care, Other (non HMO)

## 2016-05-20 DIAGNOSIS — M722 Plantar fascial fibromatosis: Secondary | ICD-10-CM

## 2016-05-20 NOTE — Progress Notes (Signed)
   Subjective:    Patient ID: Brittany Riggs, female    DOB: 09-13-1971, 45 y.o.   MRN: [id number]  HPI Pt presents with continue plantar fascial pain in the medial arch area and mid heel of her Lt foot, with some improvement noted  Review of Systems    All other systems negative Objective:   Physical Exam  Pain upon palpation of the left foot medial and mid facial band/heel, swelling decreased      Assessment & Plan:  ESWT treatment administered to Lt heel at 20  joules 0.35 energy for 3000 pulses. EPAT therapy administered to surrounding connective tissues for 3000 pulses. Pt tolerated procedure well, with intermittent pain from 4-7. Advised against use of NSAIDs and ice, re-appointed in 4 weeks for re-evaluation

## 2016-05-21 ENCOUNTER — Telehealth: Payer: Self-pay | Admitting: Internal Medicine

## 2016-05-21 DIAGNOSIS — R058 Other specified cough: Secondary | ICD-10-CM

## 2016-05-21 DIAGNOSIS — R05 Cough: Secondary | ICD-10-CM

## 2016-05-21 NOTE — Telephone Encounter (Signed)
Dr Melvyn Novas, please advise on lab results in chart

## 2016-05-21 NOTE — Telephone Encounter (Signed)
Sorry for the delay but routing was not the usual path  She appears to have more allergies than asthma> for cat, ragweed/ grass and trees   rec she return to Dr Lorin Picket [facility] office or let us refer her to another group. If she was happy with the fomer she should return

## 2016-05-21 NOTE — Telephone Encounter (Signed)
Spoke with pt and notified of results per Dr. Melvyn Novas. Pt verbalized understanding and denied any questions. She is going to do some research and then let us know who she wants [place] to refer to

## 2016-05-22 ENCOUNTER — Telehealth: Payer: Self-pay | Admitting: Internal Medicine

## 2016-05-22 NOTE — Telephone Encounter (Signed)
Spoke with pt and advised that per [facility] pt could choose to return to [facility] or to [facility]. Pt states that she has been to both offices previously, but would likely return to [facility]. Pt will call them to make appt and let us know if referral is needed. Nothing further at this time.

## 2016-06-22 ENCOUNTER — Ambulatory Visit (INDEPENDENT_AMBULATORY_CARE_PROVIDER_SITE_OTHER): Payer: Managed Care, Other (non HMO) | Admitting: Podiatry

## 2016-06-22 DIAGNOSIS — M722 Plantar fascial fibromatosis: Secondary | ICD-10-CM

## 2016-06-22 MED ORDER — TRIAMCINOLONE ACETONIDE 10 MG/ML IJ SUSP
10.0000 mg | Freq: Once | INTRAMUSCULAR | Status: AC
  Administered 2016-06-22: 10 mg

## 2016-06-23 ENCOUNTER — Ambulatory Visit (INDEPENDENT_AMBULATORY_CARE_PROVIDER_SITE_OTHER): Payer: Managed Care, Other (non HMO) | Admitting: Allergy and Immunology

## 2016-06-23 ENCOUNTER — Encounter: Payer: Self-pay | Admitting: Allergy and Immunology

## 2016-06-23 VITALS — BP 132/78 | HR 100 | Temp 98.8°F | Resp 16 | Ht 70.0 in | Wt >= 6400 oz

## 2016-06-23 DIAGNOSIS — K219 Gastro-esophageal reflux disease without esophagitis: Secondary | ICD-10-CM | POA: Diagnosis not present

## 2016-06-23 DIAGNOSIS — R053 Chronic cough: Secondary | ICD-10-CM | POA: Insufficient documentation

## 2016-06-23 DIAGNOSIS — R05 Cough: Secondary | ICD-10-CM

## 2016-06-23 DIAGNOSIS — J3089 Other allergic rhinitis: Secondary | ICD-10-CM

## 2016-06-23 DIAGNOSIS — J45991 Cough variant asthma: Secondary | ICD-10-CM | POA: Diagnosis not present

## 2016-06-23 MED ORDER — AZELASTINE-FLUTICASONE 137-50 MCG/ACT NA SUSP: 1.0000 | Freq: Two times a day (BID) | NASAL | Status: DC

## 2016-06-23 NOTE — Progress Notes (Signed)
New Patient Note  RE: Brittany Riggs MRN: [id number] DOB: 1971-07-10 Date of Office Visit: 06/23/2016  Referring provider: Tanda Rockers, MD Primary care provider: Vidal Schwalbe, MD  Chief Complaint: Cough; Asthma; and Allergic Rhinitis    History of present illness: HPI Comments: Brittany Riggs is a 45 y.o. female presenting today for consultation of coughing, asthma, and rhinitis.  She reports that she had "asthmatic bronchitis" in favor 2017 and has had a persistent cough since that time.  Cough is described as nonproductive and feels like it originates as a tickle or irritation at the base of her throat.  She has been evaluated and treated by her primary care physician, Dr. Dema Severin, as well as her pulmonologist, Dr. Melvyn Novas.  She expresses copious thick postnasal drainage, nasal congestion, rhinorrhea, sneezing, ocular pruritus, and occasional sinus pressure.  She typically has 2 or 3 sinus infections per year on average and reports that she develops bronchitis every spring and every fall.  Her nasal/ocular/sinus symptoms occur year around but her most frequent and severe in the springtime and in the fall.  She had been on aeroallergen immunotherapy approximately 10 years, but discontinued after one year of therapy because her symptoms had improved so significantly that she thought she was cured.  She has acid reflux which had been well-controlled with omeprazole in the morning and famotidine at night, however she ran out of omeprazole did not get it refilled.   Assessment and plan: Persistent cough The most common causes of chronic cough include the following: upper airway cough syndrome (UACS) which is caused by variety of rhinosinus conditions; asthma; gastroesophageal reflux disease (GERD); chronic bronchitis from cigarette smoking or other inhaled environmental irritants; non-asthmatic eosinophilic bronchitis; and bronchiectasis. In prospective studies, these conditions have  accounted for up to 94% of the causes of chronic cough in immunocompetent adults. The history and physical examination suggest that her cough is multifactorial with contribution from postnasal drainage, bronchial hyperresponsiveness, and acid reflux. We will address these issues at this time.   Treatment plan as outlined below.  We will regroup in 8 weeks to assess treatment response and adjust therapy accordingly.  Seasonal and perennial allergic rhinitis  Aeroallergen avoidance measures have been discussed and provided in written form.  A prescription has been provided for Dymista (azelastine/fluticasone) nasal spray, 1 spray per nostril twice daily as needed. Proper nasal spray technique has been discussed and demonstrated.  Continue montelukast 10 mg daily at bedtime.  Guaifenesin 1200 mg twice daily as needed with adequate hydration as discussed.   Nasal saline lavage (NeilMed) as needed has been recommended along with instructions for proper administration. The risks and benefits of aeroallergen immunotherapy have been discussed. The patient is motivated to initiate immunotherapy if insurance coverage is favorable. She will let us know how she would like to proceed.  Cough variant asthma  Continue montelukast 10 mg daily bedtime and albuterol every 4-6 hours as needed.  Subjective and objective measures of pulmonary function will be followed and the treatment plan will be adjusted accordingly.  GERD (gastroesophageal reflux disease)  Restart omeprazole 40 mg daily 30 minutes prior to breakfast.  Continue famotidine 20 mg daily at bedtime.  Appropriate reflux last on modifications have been discussed and provided in written form.   Diagnositics: Spirometry: FVC was 2.45 L and FEV1 was 2.03 L (74% predicted) without significant postbronchodilator improvement. Allergy skin testing: Positive to grass pollen, weed pollen, ragweed pollen, tree pollen, mold, cat hair, dust mite,  cockroach antigen.    Physical examination: Blood pressure 132/78, pulse 100, temperature 98.8 F (37.1 C), temperature source Oral, resp. rate 16, height 5\' 10"  (1.778 m), weight 407 lb (184.614 kg), SpO2 96 %.  General: Alert, interactive, in no acute distress. HEENT: TMs pearly gray, turbinates moderately edematous without discharge, post-pharynx erythematous. Neck: Supple without lymphadenopathy. Lungs: Clear to auscultation without wheezing, rhonchi or rales. CV: Normal S1, S2 without murmurs. Abdomen: Nondistended, nontender. Skin: Warm and dry, without lesions or rashes. Extremities:  No clubbing, cyanosis or edema. Neuro:   Grossly intact.  Review of systems:  Review of Systems  Constitutional: Negative for fever, chills and weight loss.  HENT: Positive for congestion. Negative for nosebleeds.   Eyes: Negative for blurred vision.  Respiratory: Positive for cough. Negative for hemoptysis.   Cardiovascular: Negative for chest pain.  Gastrointestinal: Negative for diarrhea and constipation.  Genitourinary: Negative for dysuria.  Musculoskeletal: Negative for myalgias and joint pain.  Neurological: Positive for headaches. Negative for dizziness.  Endo/Heme/Allergies: Positive for environmental allergies. Does not bruise/bleed easily.    Past medical history:  Past Medical History  Diagnosis Date  . Allergy   . Hypertension   . Asthma     Past surgical history:  History reviewed. No pertinent past surgical history.  Family history: Family History  Problem Relation Age of Onset  . Asthma Sister   . Allergies Sister   . Rheum arthritis Mother   . Rheum arthritis Sister     Social history: Social History   Social History  . Marital Status: Single    Spouse Name: N/A  . Number of Children: N/A  . Years of Education: N/A   Occupational History  . Not on file.   Social History Main Topics  . Smoking status: Never Smoker   . Smokeless tobacco: Never Used    . Alcohol Use: No  . Drug Use: No  . Sexual Activity: Not on file   Other Topics Concern  . Not on file   Social History Narrative   Environmental History: The patient lives in 69-year-old house with carpeting in the bedroom and central air/heat.  She is a nonsmoker without pets.    Medication List       This list is accurate as of: 06/23/16  6:43 PM.  Always use your most recent med list.               Adalimumab 40 MG/0.8ML Pnkt  Inject into the skin. Reported on 03/12/2016     Azelastine-Fluticasone 137-50 MCG/ACT Susp  Place 1 spray into the nose 2 (two) times daily.     celecoxib 200 MG capsule  Commonly known as:  CELEBREX  Take 200 mg by mouth 2 (two) times daily.     chlorpheniramine 4 MG tablet  Commonly known as:  CHLOR-TRIMETON  Take 4 mg by mouth at bedtime.     cyclobenzaprine 10 MG tablet  Commonly known as:  FLEXERIL     DELSYM 30 MG/5ML liquid  Generic drug:  dextromethorphan  as needed for cough. Reported on 06/23/2016     famotidine 20 MG tablet  Commonly known as:  PEPCID  Take 20 mg by mouth at bedtime. Reported on 06/23/2016     FLUTTER Devi  Use as directed     gabapentin 100 MG capsule  Commonly known as:  NEURONTIN  Take 1 capsule (100 mg total) by mouth 3 (three) times daily. One three times daily  loratadine 10 MG tablet  Commonly known as:  CLARITIN  Take 10 mg by mouth daily. Reported on 06/23/2016     montelukast 10 MG tablet  Commonly known as:  SINGULAIR  Take 1 tablet (10 mg total) by mouth at bedtime.     omeprazole 40 MG capsule  Commonly known as:  PRILOSEC  Take 40 mg by mouth daily.     predniSONE 10 MG tablet  Commonly known as:  DELTASONE  Take  4 each am x 2 days,   2 each am x 2 days,  1 each am x 2 days and stop     PROAIR HFA 108 (90 Base) MCG/ACT inhaler  Generic drug:  albuterol  Inhale 2 puffs into the lungs every 6 (six) hours as needed for wheezing or shortness of breath.     traMADol 50 MG tablet   Commonly known as:  ULTRAM  1-2 every 4 hours as needed for cough or pain        Known medication allergies: Allergies  Allergen Reactions  . Amoxicillin Hives  . Codeine Itching  . Penicillins Hives  . Prednisone Other (See Comments)    Severe cramps and "spasms"    I appreciate the opportunity to take part in Raisa's care. Please do not hesitate to contact me with questions.  Sincerely,   R. Edgar Frisk, MD

## 2016-06-23 NOTE — Assessment & Plan Note (Signed)
The most common causes of chronic cough include the following: upper airway cough syndrome (UACS) which is caused by variety of rhinosinus conditions; asthma; gastroesophageal reflux disease (GERD); chronic bronchitis from cigarette smoking or other inhaled environmental irritants; non-asthmatic eosinophilic bronchitis; and bronchiectasis. In prospective studies, these conditions have accounted for up to 94% of the causes of chronic cough in immunocompetent adults. The history and physical examination suggest that her cough is multifactorial with contribution from postnasal drainage, bronchial hyperresponsiveness, and acid reflux. We will address these issues at this time.   Treatment plan as outlined below.  We will regroup in 8 weeks to assess treatment response and adjust therapy accordingly.

## 2016-06-23 NOTE — Assessment & Plan Note (Signed)
   Continue montelukast 10 mg daily bedtime and albuterol every 4-6 hours as needed.  Subjective and objective measures of pulmonary function will be followed and the treatment plan will be adjusted accordingly.

## 2016-06-23 NOTE — Patient Instructions (Addendum)
Persistent cough The most common causes of chronic cough include the following: upper airway cough syndrome (UACS) which is caused by variety of rhinosinus conditions; asthma; gastroesophageal reflux disease (GERD); chronic bronchitis from cigarette smoking or other inhaled environmental irritants; non-asthmatic eosinophilic bronchitis; and bronchiectasis. In prospective studies, these conditions have accounted for up to 94% of the causes of chronic cough in immunocompetent adults. The history and physical examination suggest that her cough is multifactorial with contribution from postnasal drainage, bronchial hyperresponsiveness, and acid reflux. We will address these issues at this time.   Treatment plan as outlined below.  We will regroup in 8 weeks to assess treatment response and adjust therapy accordingly.  Seasonal and perennial allergic rhinitis  Aeroallergen avoidance measures have been discussed and provided in written form.  A prescription has been provided for Dymista (azelastine/fluticasone) nasal spray, 1 spray per nostril twice daily as needed. Proper nasal spray technique has been discussed and demonstrated.  Continue montelukast 10 mg daily at bedtime.  Guaifenesin 1200 mg twice daily as needed with adequate hydration as discussed.   Nasal saline lavage (NeilMed) as needed has been recommended along with instructions for proper administration. The risks and benefits of aeroallergen immunotherapy have been discussed. The patient is motivated to initiate immunotherapy if insurance coverage is favorable. She will let us know how she would like to proceed.  Cough variant asthma  Continue montelukast 10 mg daily bedtime and albuterol every 4-6 hours as needed.  Subjective and objective measures of pulmonary function will be followed and the treatment plan will be adjusted accordingly.  GERD (gastroesophageal reflux disease)  Restart omeprazole 40 mg daily 30 minutes prior to  breakfast.  Continue famotidine 20 mg daily at bedtime.  Appropriate reflux last on modifications have been discussed and provided in written form.    Return in about 8 weeks (around 08/18/2016), or if symptoms worsen or fail to improve.  Reducing Pollen Exposure  The American Academy of Allergy, Asthma and Immunology suggests the following steps to reduce your exposure to pollen during allergy seasons.    1. Do not hang sheets or clothing out to dry; pollen may collect on these items. 2. Do not mow lawns or spend time around freshly cut grass; mowing stirs up pollen. 3. Keep windows closed at night.  Keep car windows closed while driving. 4. Minimize morning activities outdoors, a time when pollen counts are usually at their highest. 5. Stay indoors as much as possible when pollen counts or humidity is high and on windy days when pollen tends to remain in the air longer. 6. Use air conditioning when possible.  Many air conditioners have filters that trap the pollen spores. 7. Use a HEPA room air filter to remove pollen form the indoor air you breathe.   Control of House Dust Mite Allergen  House dust mites play a major role in allergic asthma and rhinitis.  They occur in environments with high humidity wherever human skin, the food for dust mites is found. High levels have been detected in dust obtained from mattresses, pillows, carpets, upholstered furniture, bed covers, clothes and soft toys.  The principal allergen of the house dust mite is found in its feces.  A gram of dust may contain 1,000 mites and 250,000 fecal particles.  Mite antigen is easily measured in the air during house cleaning activities.    1. Encase mattresses, including the box spring, and pillow, in an air tight cover.  Seal the zipper end of the encased  mattresses with wide adhesive tape. 2. Wash the bedding in water of 130 degrees Farenheit weekly.  Avoid cotton comforters/quilts and flannel bedding: the most  ideal bed covering is the dacron comforter. 3. Remove all upholstered furniture from the bedroom. 4. Remove carpets, carpet padding, rugs, and non-washable window drapes from the bedroom.  Wash drapes weekly or use plastic window coverings. 5. Remove all non-washable stuffed toys from the bedroom.  Wash stuffed toys weekly. 6. Have the room cleaned frequently with a vacuum cleaner and a damp dust-mop.  The patient should not be in a room which is being cleaned and should wait 1 hour after cleaning before going into the room. 7. Close and seal all heating outlets in the bedroom.  Otherwise, the room will become filled with dust-laden air.  An electric heater can be used to heat the room. Reduce indoor humidity to less than 50%.  Do not use a humidifier.  Control of Dog or Cat Allergen  Avoidance is the best way to manage a dog or cat allergy. If you have a dog or cat and are allergic to dog or cats, consider removing the dog or cat from the home. If you have a dog or cat but don't want to find it a new home, or if your family wants a pet even though someone in the household is allergic, here are some strategies that may help keep symptoms at bay:  1. Keep the pet out of your bedroom and restrict it to only a few rooms. Be advised that keeping the dog or cat in only one room will not limit the allergens to that room. 2. Don't pet, hug or kiss the dog or cat; if you do, wash your hands with soap and water. 3. High-efficiency particulate air (HEPA) cleaners run continuously in a bedroom or living room can reduce allergen levels over time. 4. Regular use of a high-efficiency vacuum cleaner or a central vacuum can reduce allergen levels. 5. Giving your dog or cat a bath at least once a week can reduce airborne allergen.  Control of Mold Allergen  Mold and fungi can grow on a variety of surfaces provided certain temperature and moisture conditions exist.  Outdoor molds grow on plants, decaying  vegetation and soil.  The major outdoor mold, Alternaria and Cladosporium, are found in very high numbers during hot and dry conditions.  Generally, a late Summer - Fall peak is seen for common outdoor fungal spores.  Rain will temporarily lower outdoor mold spore count, but counts rise rapidly when the rainy period ends.  The most important indoor molds are Aspergillus and Penicillium.  Dark, humid and poorly ventilated basements are ideal sites for mold growth.  The next most common sites of mold growth are the bathroom and the kitchen.  Outdoor [employer] 1. Use air conditioning and keep windows closed 2. Avoid exposure to decaying vegetation. 3. Avoid leaf raking. 4. Avoid grain handling. 5. Consider wearing a face mask if working in moldy areas.  Indoor Mold Control 1. Maintain humidity below 50%. 2. Clean washable surfaces with 5% bleach solution. 3. Remove sources e.g. Contaminated carpets.  Control of Cockroach Allergen  Cockroach allergen has been identified as an important cause of acute attacks of asthma, especially in urban settings.  There are fifty-five species of cockroach that exist in the [place], however only three, the [place], [place] species produce allergen that can affect patients with Asthma.  Allergens can be obtained from fecal  particles, egg casings and secretions from cockroaches.    1. Remove food sources. 2. Reduce access to water. 3. Seal access and entry points. 4. Spray runways with 0.5-1% Diazinon or Chlorpyrifos 5. Blow boric acid power under stoves and refrigerator. 6. Place bait stations (hydramethylnon) at feeding sites.    Lifestyle Changes for Controlling GERD  When you have GERD, stomach acid feels as if it's backing up toward your mouth. Whether or not you take medication to control your GERD, your symptoms can often be improved with lifestyle changes.   Raise Your Head  Reflux is more likely to strike when you're lying  down flat, because stomach fluid can  flow backward more easily. Raising the head of your bed 4-6 inches can help. To do this:  Slide blocks or books under the legs at the head of your bed. Or, place a wedge under  the mattress. Many foam stores can make a suitable wedge for you. The wedge  should run from your waist to the top of your head.  Don't just prop your head on several pillows. This increases pressure on your  stomach. It can make GERD worse.  Watch Your Eating Habits Certain foods may increase the acid in your stomach or relax the lower esophageal sphincter, making GERD more likely. It's best to avoid the following:  Coffee, tea, and carbonated drinks (with and without caffeine)  Fatty, fried, or spicy food  Mint, chocolate, onions, and tomatoes  Any other foods that seem to irritate your stomach or cause you pain  Relieve the Pressure  Eat smaller meals, even if you have to eat more often.  Don't lie down right after you eat. Wait a few hours for your stomach to empty.  Avoid tight belts and tight-fitting clothes.  Lose excess weight.  Tobacco and Alcohol  Avoid smoking tobacco and drinking alcohol. They can make GERD symptoms worse.

## 2016-06-23 NOTE — Assessment & Plan Note (Addendum)
   Aeroallergen avoidance measures have been discussed and provided in written form.  A prescription has been provided for Dymista (azelastine/fluticasone) nasal spray, 1 spray per nostril twice daily as needed. Proper nasal spray technique has been discussed and demonstrated.  Continue montelukast 10 mg daily at bedtime.  Guaifenesin 1200 mg twice daily as needed with adequate hydration as discussed.   Nasal saline lavage (NeilMed) as needed has been recommended along with instructions for proper administration. The risks and benefits of aeroallergen immunotherapy have been discussed. The patient is motivated to initiate immunotherapy if insurance coverage is favorable. She will let us know how she would like to proceed.

## 2016-06-23 NOTE — Assessment & Plan Note (Signed)
   Restart omeprazole 40 mg daily 30 minutes prior to breakfast.  Continue famotidine 20 mg daily at bedtime.  Appropriate reflux last on modifications have been discussed and provided in written form.

## 2016-06-23 NOTE — Progress Notes (Signed)
Subjective:     Patient ID: Brittany Riggs, female   DOB: 1971/10/21, 45 y.o.   MRN: [id number]  HPI patient states that the heel is doing really well but there's some discomfort in the arch pain that she would like to get better   Review of Systems     Objective:   Physical Exam Neurovascular status intact muscle strength adequate with heel pain it's doing very well with pain in the proximal portion distal to the insertion into the calcaneus    Assessment:     Obese female with fasciitis-like symptoms left    Plan:     Distal injection administered 3 mg Kenalog 5 mill grams Xylocaine and advised we may have to do shockwave again in future

## 2016-06-24 ENCOUNTER — Telehealth: Payer: Self-pay

## 2016-06-24 NOTE — Addendum Note (Signed)
Addended by: Golda Acre C on: 06/24/2016 01:05 PM   Modules accepted: Orders

## 2016-06-24 NOTE — Telephone Encounter (Signed)
Patient called and wants to start ITX.

## 2016-06-24 NOTE — Telephone Encounter (Signed)
Immunotherapy orders are completed. Thanks.

## 2016-06-25 DIAGNOSIS — J3089 Other allergic rhinitis: Secondary | ICD-10-CM | POA: Diagnosis not present

## 2016-06-26 ENCOUNTER — Encounter: Payer: Self-pay | Admitting: *Deleted

## 2016-06-26 DIAGNOSIS — J301 Allergic rhinitis due to pollen: Secondary | ICD-10-CM | POA: Diagnosis not present

## 2016-06-26 NOTE — Progress Notes (Signed)
4 VIAL SET MADE ON 06-26-16 FOR GRASS-WEED-TREE & MOLD-MITE-CAT-CR.

## 2016-06-30 ENCOUNTER — Telehealth: Payer: Self-pay | Admitting: *Deleted

## 2016-06-30 MED ORDER — AZELASTINE HCL 0.15 % NA SOLN: 2.0000 | Freq: Two times a day (BID) | NASAL | Status: DC

## 2016-06-30 MED ORDER — FLUTICASONE PROPIONATE 50 MCG/ACT NA SUSP: 1.0000 | Freq: Two times a day (BID) | NASAL | Status: DC

## 2016-06-30 NOTE — Telephone Encounter (Signed)
Insurance will not cover [place]. Can we split? Please advise.

## 2016-06-30 NOTE — Telephone Encounter (Signed)
Sent medications into pharmacy.

## 2016-06-30 NOTE — Telephone Encounter (Signed)
Split. Thanks.

## 2016-07-01 ENCOUNTER — Telehealth: Payer: Self-pay | Admitting: *Deleted

## 2016-07-01 NOTE — Telephone Encounter (Signed)
Entered in error

## 2016-07-02 ENCOUNTER — Other Ambulatory Visit: Payer: Self-pay

## 2016-07-02 ENCOUNTER — Ambulatory Visit (INDEPENDENT_AMBULATORY_CARE_PROVIDER_SITE_OTHER): Payer: Managed Care, Other (non HMO)

## 2016-07-02 DIAGNOSIS — J309 Allergic rhinitis, unspecified: Secondary | ICD-10-CM

## 2016-07-02 MED ORDER — EPINEPHRINE 0.3 MG/0.3ML IJ SOAJ: INTRAMUSCULAR | Status: DC

## 2016-07-02 NOTE — Progress Notes (Signed)
Immunotherapy   Patient Details  Name: Brittany Riggs MRN: [id number] Date of Birth: Mar 20, 1971  07/02/2016  Brittany Riggs Patient came in today to start ITX. Following schedule: A Frequency:  1-2 times weekly with 72 hours in between visits.   Epi-Pen:  RX sent to [facility].  Patient understands how to use EpiPen.   Consent signed and patient instructions given.   Janan Ridge 07/02/2016, 5:44 PM

## 2016-07-07 ENCOUNTER — Ambulatory Visit (INDEPENDENT_AMBULATORY_CARE_PROVIDER_SITE_OTHER): Payer: Managed Care, Other (non HMO) | Admitting: *Deleted

## 2016-07-07 DIAGNOSIS — J309 Allergic rhinitis, unspecified: Secondary | ICD-10-CM | POA: Diagnosis not present

## 2016-07-08 ENCOUNTER — Ambulatory Visit (INDEPENDENT_AMBULATORY_CARE_PROVIDER_SITE_OTHER): Payer: Managed Care, Other (non HMO) | Admitting: Podiatry

## 2016-07-08 DIAGNOSIS — M722 Plantar fascial fibromatosis: Secondary | ICD-10-CM

## 2016-07-08 MED ORDER — TRIAMCINOLONE ACETONIDE 10 MG/ML IJ SUSP
10.0000 mg | Freq: Once | INTRAMUSCULAR | Status: AC
  Administered 2016-07-08: 10 mg

## 2016-07-09 NOTE — Progress Notes (Signed)
Subjective:     Patient ID: Brittany Riggs, female   DOB: 08-30-1971, 45 y.o.   MRN: [id number]  HPI patient states she's doing pretty well on the inside of the heel but on the outside there is one spot that's been irritated and if she could get rid of that she feels like she would be close to being better   Review of Systems     Objective:   Physical Exam Morbid obesity which is a certain complicating factor with discomfort in the lateral plantar aspect of the left heel which is relatively new in its.. Patient's medial heel seems to be doing much better with diminished discomfort upon palpation    Assessment:     Lateral inflammatory process which may be due to change in gait with hopeful more pressure on the medial side which will gradually alleviate symptoms    Plan:     Careful injection lateral administered 3 mg Kenalog 5 mg Xylocaine to reduce stress and reappoint to recheck

## 2016-07-14 ENCOUNTER — Ambulatory Visit (INDEPENDENT_AMBULATORY_CARE_PROVIDER_SITE_OTHER): Payer: Managed Care, Other (non HMO)

## 2016-07-14 DIAGNOSIS — J309 Allergic rhinitis, unspecified: Secondary | ICD-10-CM

## 2016-07-21 ENCOUNTER — Ambulatory Visit (INDEPENDENT_AMBULATORY_CARE_PROVIDER_SITE_OTHER): Payer: Managed Care, Other (non HMO) | Admitting: *Deleted

## 2016-07-21 DIAGNOSIS — J309 Allergic rhinitis, unspecified: Secondary | ICD-10-CM

## 2016-07-28 ENCOUNTER — Ambulatory Visit (INDEPENDENT_AMBULATORY_CARE_PROVIDER_SITE_OTHER): Payer: Managed Care, Other (non HMO) | Admitting: *Deleted

## 2016-07-28 DIAGNOSIS — J309 Allergic rhinitis, unspecified: Secondary | ICD-10-CM | POA: Diagnosis not present

## 2016-08-03 ENCOUNTER — Telehealth: Payer: Self-pay | Admitting: *Deleted

## 2016-08-03 NOTE — Telephone Encounter (Signed)
Pt states it has been a month since her last appt and her heel still hurt, she is continuing to ice. I reviewed her clinical and Dr. Paulla Dolly requested pt to come in for follow up.  Left message for pt to make an appt this week.

## 2016-08-05 ENCOUNTER — Encounter: Payer: Self-pay | Admitting: Podiatry

## 2016-08-05 ENCOUNTER — Ambulatory Visit (INDEPENDENT_AMBULATORY_CARE_PROVIDER_SITE_OTHER): Payer: Managed Care, Other (non HMO) | Admitting: Podiatry

## 2016-08-05 ENCOUNTER — Ambulatory Visit (INDEPENDENT_AMBULATORY_CARE_PROVIDER_SITE_OTHER): Payer: Managed Care, Other (non HMO) | Admitting: *Deleted

## 2016-08-05 VITALS — BP 132/84 | Resp 16

## 2016-08-05 DIAGNOSIS — J309 Allergic rhinitis, unspecified: Secondary | ICD-10-CM | POA: Diagnosis not present

## 2016-08-05 DIAGNOSIS — M722 Plantar fascial fibromatosis: Secondary | ICD-10-CM

## 2016-08-05 MED ORDER — TRIAMCINOLONE ACETONIDE 10 MG/ML IJ SUSP
10.0000 mg | Freq: Once | INTRAMUSCULAR | Status: AC
  Administered 2016-08-05: 10 mg

## 2016-08-05 NOTE — Progress Notes (Signed)
Subjective:     Patient ID: Brittany Riggs, female   DOB: 04/07/1971, 45 y.o.   MRN: [id number]  HPI patient presents stating that the left heel is really bothering her and making it hard for her to walk. We have utilize numerous medications and so far have been able to give her partial release with pain that reoccurs over time with marked and obesity been a part of the problem. 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 I discussed open surgery for this as I'm worried that she's walking differently and is can develop increased compensatory symptoms and she wants to go this route and I allowed her to sign consent form after extensive review. Patient will have surgery performed at her convenience and is encouraged to call with questions to recovery can take 6 months to one year and that alternative treatments complications have been discussed with her

## 2016-08-05 NOTE — Patient Instructions (Signed)

## 2016-08-11 ENCOUNTER — Ambulatory Visit: Payer: Managed Care, Other (non HMO) | Admitting: Allergy and Immunology

## 2016-08-11 ENCOUNTER — Ambulatory Visit (INDEPENDENT_AMBULATORY_CARE_PROVIDER_SITE_OTHER): Payer: Managed Care, Other (non HMO) | Admitting: *Deleted

## 2016-08-11 ENCOUNTER — Other Ambulatory Visit: Payer: Self-pay | Admitting: Internal Medicine

## 2016-08-11 DIAGNOSIS — J309 Allergic rhinitis, unspecified: Secondary | ICD-10-CM | POA: Diagnosis not present

## 2016-08-11 DIAGNOSIS — R058 Other specified cough: Secondary | ICD-10-CM

## 2016-08-11 DIAGNOSIS — R05 Cough: Secondary | ICD-10-CM

## 2016-08-14 ENCOUNTER — Encounter: Payer: Self-pay | Admitting: *Deleted

## 2016-08-18 ENCOUNTER — Telehealth: Payer: Self-pay | Admitting: *Deleted

## 2016-08-18 ENCOUNTER — Other Ambulatory Visit: Payer: Self-pay

## 2016-08-18 ENCOUNTER — Ambulatory Visit (INDEPENDENT_AMBULATORY_CARE_PROVIDER_SITE_OTHER): Payer: Managed Care, Other (non HMO)

## 2016-08-18 DIAGNOSIS — J309 Allergic rhinitis, unspecified: Secondary | ICD-10-CM | POA: Diagnosis not present

## 2016-08-18 MED ORDER — TRAMADOL HCL 50 MG PO TABS: 50.0000 mg | ORAL_TABLET | Freq: Three times a day (TID) | ORAL | 0 refills | Status: DC | PRN

## 2016-08-18 NOTE — Telephone Encounter (Signed)
"  I'd like to schedule my surgery."  When would you like to schedule it?  "I'd like to do it on September 26."  That date is available I will get it schedule.  Remember not to eat or drink anything after midnight the night before.  You can register with the surgical center online. Surgical center will call you with the arrival time Friday or Monday before surgery date.  "How do I register online?"  Instructions are in your brochure that was given to you.  "I'm at work I don't have it here with me.  Can you tell me how?  I need to know the arrival time so I can make arrangements for a friend to bring me.  I can't just wait until the last minute to tell them."  I suggest you call the surgical and ask if they can give you a specific time.  I can't give you a time because they may have cancellations, have a Diabetic patient that needs to go first or have a child that needs to go first.  Instructions were given to register with surgical center.

## 2016-08-20 ENCOUNTER — Telehealth: Payer: Self-pay | Admitting: *Deleted

## 2016-08-20 NOTE — Telephone Encounter (Signed)
"  The name is Brittany Riggs.  Phone number is [phone].  Thank you."  I am returning your call.  You had left a message.  "I already spoke to you.  My surgery is supposed to be scheduled for 09/08/2016.  Someone at the surgery center told me they are waiting on your part to be completed.  I do have a question.  Do you all have a health proxy form that I can get?  I was told that I was to get it from the doctor."  I am not familiar with that form.  "It is basically like a will.  It states that if anything happens to me during surgery, I can designate someone to take care of my affairs."  Let me transfer you to someone in administrative department that handles forms.

## 2016-08-27 ENCOUNTER — Ambulatory Visit (INDEPENDENT_AMBULATORY_CARE_PROVIDER_SITE_OTHER): Payer: Managed Care, Other (non HMO) | Admitting: *Deleted

## 2016-08-27 DIAGNOSIS — J309 Allergic rhinitis, unspecified: Secondary | ICD-10-CM | POA: Diagnosis not present

## 2016-09-04 LAB — LIPID PANEL
Cholesterol: 160 mg/dL (ref 0–200)
HDL: 100 mg/dL — AB (ref 35–70)
LDL Cholesterol: 126 mg/dL
LDl/HDL Ratio: 4.7
Triglycerides: 127 mg/dL (ref 40–160)

## 2016-09-04 LAB — BASIC METABOLIC PANEL
BUN: 11 mg/dL (ref 4–21)
Creatinine: 0.8 mg/dL (ref 0.5–1.1)
Glucose: 125 mg/dL
Potassium: 4.5 mmol/L (ref 3.4–5.3)
Sodium: 138 mmol/L (ref 137–147)

## 2016-09-04 LAB — HEPATIC FUNCTION PANEL
ALT: 31 U/L (ref 7–35)
AST: 18 U/L (ref 13–35)
Bilirubin, Total: 0.5 mg/dL

## 2016-09-04 LAB — CBC AND DIFFERENTIAL
HCT: 38 % (ref 36–46)
Hemoglobin: 12.3 g/dL (ref 12.0–16.0)
Platelets: 360 10*3/uL (ref 150–399)
WBC: 11.3 10^3/mL

## 2016-09-04 LAB — TSH: TSH: 0.42 u[IU]/mL (ref 0.41–5.90)

## 2016-09-08 ENCOUNTER — Telehealth: Payer: Self-pay | Admitting: Podiatry

## 2016-09-08 ENCOUNTER — Encounter: Payer: Self-pay | Admitting: Podiatry

## 2016-09-08 DIAGNOSIS — M722 Plantar fascial fibromatosis: Secondary | ICD-10-CM | POA: Diagnosis not present

## 2016-09-10 NOTE — Progress Notes (Signed)
DOS 09.26.2017 Endoscopic Release Med. Band Left Heel

## 2016-09-15 ENCOUNTER — Ambulatory Visit (INDEPENDENT_AMBULATORY_CARE_PROVIDER_SITE_OTHER): Payer: Managed Care, Other (non HMO)

## 2016-09-15 DIAGNOSIS — J309 Allergic rhinitis, unspecified: Secondary | ICD-10-CM | POA: Diagnosis not present

## 2016-09-17 ENCOUNTER — Encounter: Payer: Self-pay | Admitting: Podiatry

## 2016-09-17 ENCOUNTER — Ambulatory Visit (INDEPENDENT_AMBULATORY_CARE_PROVIDER_SITE_OTHER): Payer: Managed Care, Other (non HMO) | Admitting: Podiatry

## 2016-09-17 VITALS — Temp 97.0°F

## 2016-09-17 DIAGNOSIS — M722 Plantar fascial fibromatosis: Secondary | ICD-10-CM | POA: Diagnosis not present

## 2016-09-17 NOTE — Progress Notes (Signed)
Subjective:     Patient ID: Brittany Riggs, female   DOB: [birth year], 45 y.o.   MRN: [id number]  HPI patient states I'm doing well with minimal discomfort   Review of Systems     Objective:   Physical Exam Neurovascular status intact negative Homans sign noted with well-healed surgical site left medial and lateral heel side    Assessment:     Doing well post endoscopic surgery    Plan:     Advised on continued elevation immobilization compression and reapplied sterile dressing and reappoint 2 weeks for stitch removal

## 2016-09-24 ENCOUNTER — Ambulatory Visit (INDEPENDENT_AMBULATORY_CARE_PROVIDER_SITE_OTHER): Payer: Managed Care, Other (non HMO) | Admitting: *Deleted

## 2016-09-24 DIAGNOSIS — J309 Allergic rhinitis, unspecified: Secondary | ICD-10-CM | POA: Diagnosis not present

## 2016-09-29 ENCOUNTER — Ambulatory Visit (INDEPENDENT_AMBULATORY_CARE_PROVIDER_SITE_OTHER): Payer: Managed Care, Other (non HMO) | Admitting: *Deleted

## 2016-09-29 DIAGNOSIS — J309 Allergic rhinitis, unspecified: Secondary | ICD-10-CM | POA: Diagnosis not present

## 2016-10-01 ENCOUNTER — Ambulatory Visit (INDEPENDENT_AMBULATORY_CARE_PROVIDER_SITE_OTHER): Payer: Managed Care, Other (non HMO) | Admitting: Podiatry

## 2016-10-01 DIAGNOSIS — Z9889 Other specified postprocedural states: Secondary | ICD-10-CM

## 2016-10-01 DIAGNOSIS — M722 Plantar fascial fibromatosis: Secondary | ICD-10-CM

## 2016-10-01 NOTE — Progress Notes (Signed)
Subjective:     Patient ID: Brittany Riggs, female   DOB: [birth year], 45 y.o.   MRN: [id number]  HPI patient states I seem to be doing well but I still have discomfort if I been walking too long   Review of Systems     Objective:   Physical Exam Neurovascular status intact negative Homans sign noted with patient found to have well-healed surgical site medial lateral side of the left Lanter heel    Assessment:     Doing well after endoscopic release medial fascial band left    Plan:     Stitches were removed surgical shoe dispensed along with ankle compression and advised on continued elevation and gradual return to activity. Reappoint as symptoms indicate and I did explain the be some degree of discomfort which may last for upwards of 8 weeks to

## 2016-10-02 ENCOUNTER — Ambulatory Visit (HOSPITAL_COMMUNITY)
Admission: RE | Admit: 2016-10-02 | Discharge: 2016-10-02 | Disposition: A | Discharge status: Home / Self Care | Payer: 59 | Source: Ambulatory Visit | Attending: Family Medicine | Admitting: Family Medicine

## 2016-10-02 ENCOUNTER — Other Ambulatory Visit (HOSPITAL_COMMUNITY): Payer: Self-pay | Admitting: Family Medicine

## 2016-10-02 DIAGNOSIS — M7989 Other specified soft tissue disorders: Principal | ICD-10-CM

## 2016-10-02 DIAGNOSIS — Z9889 Other specified postprocedural states: Secondary | ICD-10-CM | POA: Insufficient documentation

## 2016-10-02 DIAGNOSIS — M79604 Pain in right leg: Secondary | ICD-10-CM | POA: Diagnosis not present

## 2016-10-02 NOTE — Progress Notes (Signed)
*  PRELIMINARY RESULTS* Vascular Ultrasound Right lower extremity venous duplex has been completed.  Preliminary findings: no evidence of DVT in visualized veins.  Called results to [place].     Landry Mellow, RDMS, RVT  10/02/2016, 4:12 PM

## 2016-10-13 ENCOUNTER — Ambulatory Visit (INDEPENDENT_AMBULATORY_CARE_PROVIDER_SITE_OTHER): Payer: Managed Care, Other (non HMO)

## 2016-10-13 DIAGNOSIS — J309 Allergic rhinitis, unspecified: Secondary | ICD-10-CM

## 2016-10-19 ENCOUNTER — Ambulatory Visit (INDEPENDENT_AMBULATORY_CARE_PROVIDER_SITE_OTHER): Payer: 59 | Admitting: Internal Medicine

## 2016-10-19 ENCOUNTER — Ambulatory Visit
Admission: RE | Admit: 2016-10-19 | Discharge: 2016-10-19 | Disposition: A | Discharge status: Home / Self Care | Payer: 59 | Source: Ambulatory Visit | Attending: Internal Medicine | Admitting: Internal Medicine

## 2016-10-19 ENCOUNTER — Encounter: Payer: Self-pay | Admitting: Internal Medicine

## 2016-10-19 VITALS — BP 120/78 | HR 112 | Wt >= 6400 oz

## 2016-10-19 DIAGNOSIS — E041 Nontoxic single thyroid nodule: Secondary | ICD-10-CM | POA: Diagnosis not present

## 2016-10-19 DIAGNOSIS — E063 Autoimmune thyroiditis: Secondary | ICD-10-CM

## 2016-10-19 NOTE — Patient Instructions (Signed)
We will call you with the ultrasound schedule.  Start Selenium 200 mcg daily.  Please come back for a follow-up appointment in 6 months.   Thyroid Biopsy The thyroid gland is a butterfly-shaped gland located in the front of the neck. It produces hormones that affect metabolism, growth and development, and body temperature. Thyroid biopsy is a procedure in which small samples of tissue or fluid are removed from the thyroid gland. The samples are then looked at under a microscope to check for abnormalities. This procedure is done to determine the cause of thyroid problems. It may be done to check for infection, cancer, or other thyroid problems. Two methods may be used for a thyroid biopsy. In one method, a thin needle is inserted through the skin and into the thyroid gland. In the other method, an open incision is made through the skin. LET [facility] CARE PROVIDER KNOW ABOUT:   Any allergies you have.  All medicines you are taking, including vitamins, herbs, eye drops, creams, and over-the-counter medicines.  Previous problems you or members of your family have had with the use of anesthetics.  Any blood disorders you have.  Previous surgeries you have had.  Medical conditions you have. RISKS AND COMPLICATIONS Generally, this is a safe procedure. However, problems can occur and include:  Bleeding from the procedure site.  Infection.  Injury to structures near the thyroid gland. BEFORE THE PROCEDURE   Ask your health care provider about:  Changing or stopping your regular medicines. This is especially important if you are taking diabetes medicines or blood thinners.  Taking medicines such as aspirin and ibuprofen. These medicines can thin your blood. Do not take these medicines before your procedure if your health care provider asks you not to.  Do not eat or drink anything after midnight on the night before the procedure or as directed by your health care provider.  You may  have a blood sample taken. PROCEDURE Either of these methods may be used to perform a thyroid biopsy:  Fine needle biopsy. You may be given medicine to help you relax (sedative). You will be asked to lie on your back with your head tipped backward to extend your neck. An area on your neck will be cleaned. A needle will then be inserted through the skin of your neck. You may be asked to avoid coughing, talking, swallowing, or making sounds during some portions of the procedure. The needle will be withdrawn once the tissue or fluid samples have been removed. Pressure may be applied to your neck to reduce swelling and ensure that bleeding has stopped. The samples will be sent to a lab for examination.  Open biopsy. You will be given medicine to make you sleep (general anesthetic). An incision will be made in your neck. A sample of thyroid tissue will be removed using surgical tools. The tissue sample will be sent for examination. In some cases, the sample may be examined during the biopsy. If that is done and cancer cells are found, some or all of the thyroid gland may be removed. The incision will be closed with stitches. AFTER THE PROCEDURE   Your recovery will be assessed and monitored.  You may have soreness and tenderness at the site of the biopsy. This should go away after a few days.  If you had an open biopsy, you may have a hoarse voice or sore throat for a couple days.  It is your responsibility to get your test results.  This information is not intended to replace advice given to you by your health care provider. Make sure you discuss any questions you have with your health care provider.   Document Released: 09/27/2007 Document Revised: 12/21/2014 Document Reviewed: 02/22/2014 Elsevier Interactive Patient Education [employer].
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
## 2016-10-20 ENCOUNTER — Telehealth: Payer: Self-pay

## 2016-10-20 ENCOUNTER — Other Ambulatory Visit: Payer: Self-pay | Admitting: Internal Medicine

## 2016-10-20 DIAGNOSIS — E041 Nontoxic single thyroid nodule: Secondary | ICD-10-CM

## 2016-10-20 NOTE — Telephone Encounter (Signed)
I ordered it

## 2016-10-20 NOTE — Telephone Encounter (Signed)
Called and gave patient results from ultrasound. Patient does want to go ahead with biopsy. Please advise. Thank you!

## 2016-10-29 ENCOUNTER — Ambulatory Visit (INDEPENDENT_AMBULATORY_CARE_PROVIDER_SITE_OTHER): Payer: Managed Care, Other (non HMO) | Admitting: *Deleted

## 2016-10-29 DIAGNOSIS — J309 Allergic rhinitis, unspecified: Secondary | ICD-10-CM

## 2016-11-03 ENCOUNTER — Ambulatory Visit (INDEPENDENT_AMBULATORY_CARE_PROVIDER_SITE_OTHER): Payer: Managed Care, Other (non HMO) | Admitting: *Deleted

## 2016-11-03 DIAGNOSIS — J309 Allergic rhinitis, unspecified: Secondary | ICD-10-CM

## 2016-11-04 ENCOUNTER — Other Ambulatory Visit (HOSPITAL_COMMUNITY)
Admission: RE | Admit: 2016-11-04 | Discharge: 2016-11-04 | Disposition: A | Discharge status: Home / Self Care | Payer: 59 | Source: Ambulatory Visit | Attending: General Surgery | Admitting: General Surgery

## 2016-11-04 ENCOUNTER — Ambulatory Visit
Admission: RE | Admit: 2016-11-04 | Discharge: 2016-11-04 | Disposition: A | Discharge status: Home / Self Care | Payer: 59 | Source: Ambulatory Visit | Attending: Internal Medicine | Admitting: Internal Medicine

## 2016-11-04 DIAGNOSIS — E041 Nontoxic single thyroid nodule: Secondary | ICD-10-CM | POA: Diagnosis not present

## 2016-11-17 ENCOUNTER — Ambulatory Visit (INDEPENDENT_AMBULATORY_CARE_PROVIDER_SITE_OTHER): Payer: Managed Care, Other (non HMO) | Admitting: *Deleted

## 2016-11-17 DIAGNOSIS — J309 Allergic rhinitis, unspecified: Secondary | ICD-10-CM | POA: Diagnosis not present

## 2016-11-24 ENCOUNTER — Ambulatory Visit (INDEPENDENT_AMBULATORY_CARE_PROVIDER_SITE_OTHER): Payer: Managed Care, Other (non HMO) | Admitting: *Deleted

## 2016-11-24 DIAGNOSIS — J309 Allergic rhinitis, unspecified: Secondary | ICD-10-CM

## 2016-12-10 ENCOUNTER — Ambulatory Visit (INDEPENDENT_AMBULATORY_CARE_PROVIDER_SITE_OTHER): Payer: Managed Care, Other (non HMO) | Admitting: *Deleted

## 2016-12-10 DIAGNOSIS — J309 Allergic rhinitis, unspecified: Secondary | ICD-10-CM | POA: Diagnosis not present

## 2016-12-15 ENCOUNTER — Telehealth: Payer: Self-pay | Admitting: *Deleted

## 2016-12-15 NOTE — Telephone Encounter (Signed)
Pt states foot still hurts and is wearing a regular shoe, asked how long will the pain last? I told pt she could have varying levels of discomfort in the heel and swelling for 6-9 months, depending on activity, age and weight, and to help with both use a tie-type shoe and ice for comfort, and if needed make an appt. Pt states she will give it another month.

## 2016-12-17 ENCOUNTER — Ambulatory Visit (INDEPENDENT_AMBULATORY_CARE_PROVIDER_SITE_OTHER): Payer: Managed Care, Other (non HMO)

## 2016-12-17 DIAGNOSIS — J309 Allergic rhinitis, unspecified: Secondary | ICD-10-CM | POA: Diagnosis not present

## 2016-12-24 ENCOUNTER — Ambulatory Visit (INDEPENDENT_AMBULATORY_CARE_PROVIDER_SITE_OTHER): Payer: Managed Care, Other (non HMO)

## 2016-12-24 DIAGNOSIS — J309 Allergic rhinitis, unspecified: Secondary | ICD-10-CM

## 2016-12-24 NOTE — Progress Notes (Signed)
This encounter was created in error - please disregard.

## 2017-01-01 ENCOUNTER — Ambulatory Visit: Payer: Managed Care, Other (non HMO) | Admitting: Podiatry

## 2017-01-04 ENCOUNTER — Ambulatory Visit: Payer: Managed Care, Other (non HMO) | Admitting: Podiatry

## 2017-01-05 ENCOUNTER — Ambulatory Visit (INDEPENDENT_AMBULATORY_CARE_PROVIDER_SITE_OTHER): Payer: Managed Care, Other (non HMO) | Admitting: *Deleted

## 2017-01-05 DIAGNOSIS — J309 Allergic rhinitis, unspecified: Secondary | ICD-10-CM

## 2017-01-14 ENCOUNTER — Ambulatory Visit (INDEPENDENT_AMBULATORY_CARE_PROVIDER_SITE_OTHER): Payer: Managed Care, Other (non HMO) | Admitting: *Deleted

## 2017-01-14 DIAGNOSIS — J309 Allergic rhinitis, unspecified: Secondary | ICD-10-CM | POA: Diagnosis not present

## 2017-01-21 ENCOUNTER — Ambulatory Visit (INDEPENDENT_AMBULATORY_CARE_PROVIDER_SITE_OTHER): Payer: Managed Care, Other (non HMO)

## 2017-01-21 DIAGNOSIS — J309 Allergic rhinitis, unspecified: Secondary | ICD-10-CM

## 2017-01-28 ENCOUNTER — Ambulatory Visit (INDEPENDENT_AMBULATORY_CARE_PROVIDER_SITE_OTHER): Payer: Managed Care, Other (non HMO)

## 2017-01-28 DIAGNOSIS — J309 Allergic rhinitis, unspecified: Secondary | ICD-10-CM

## 2017-01-29 ENCOUNTER — Telehealth: Payer: Self-pay | Admitting: *Deleted

## 2017-01-29 NOTE — Telephone Encounter (Signed)
Brittany Riggs talked to pt & put notes on her acct

## 2017-01-29 NOTE — Telephone Encounter (Signed)
Patient called and had questions about her bill. She received a bill for $23.00 and was wondering what it was for since she had met her deductible. Please call patient.

## 2017-02-04 ENCOUNTER — Ambulatory Visit (INDEPENDENT_AMBULATORY_CARE_PROVIDER_SITE_OTHER): Payer: Managed Care, Other (non HMO)

## 2017-02-04 DIAGNOSIS — J309 Allergic rhinitis, unspecified: Secondary | ICD-10-CM | POA: Diagnosis not present

## 2017-02-09 ENCOUNTER — Ambulatory Visit (INDEPENDENT_AMBULATORY_CARE_PROVIDER_SITE_OTHER): Payer: Managed Care, Other (non HMO)

## 2017-02-09 DIAGNOSIS — J309 Allergic rhinitis, unspecified: Secondary | ICD-10-CM

## 2017-02-18 ENCOUNTER — Ambulatory Visit (INDEPENDENT_AMBULATORY_CARE_PROVIDER_SITE_OTHER): Payer: Managed Care, Other (non HMO)

## 2017-02-18 DIAGNOSIS — J309 Allergic rhinitis, unspecified: Secondary | ICD-10-CM | POA: Diagnosis not present

## 2017-02-25 ENCOUNTER — Ambulatory Visit (INDEPENDENT_AMBULATORY_CARE_PROVIDER_SITE_OTHER): Payer: Managed Care, Other (non HMO) | Admitting: *Deleted

## 2017-02-25 DIAGNOSIS — J309 Allergic rhinitis, unspecified: Secondary | ICD-10-CM | POA: Diagnosis not present

## 2017-03-04 ENCOUNTER — Ambulatory Visit (INDEPENDENT_AMBULATORY_CARE_PROVIDER_SITE_OTHER): Payer: Managed Care, Other (non HMO) | Admitting: *Deleted

## 2017-03-04 DIAGNOSIS — J309 Allergic rhinitis, unspecified: Secondary | ICD-10-CM

## 2017-03-18 ENCOUNTER — Ambulatory Visit (INDEPENDENT_AMBULATORY_CARE_PROVIDER_SITE_OTHER): Payer: Managed Care, Other (non HMO) | Admitting: *Deleted

## 2017-03-18 DIAGNOSIS — J309 Allergic rhinitis, unspecified: Secondary | ICD-10-CM | POA: Diagnosis not present

## 2017-03-25 ENCOUNTER — Ambulatory Visit (INDEPENDENT_AMBULATORY_CARE_PROVIDER_SITE_OTHER): Payer: Managed Care, Other (non HMO) | Admitting: *Deleted

## 2017-03-25 DIAGNOSIS — J309 Allergic rhinitis, unspecified: Secondary | ICD-10-CM | POA: Diagnosis not present

## 2017-04-08 ENCOUNTER — Ambulatory Visit (INDEPENDENT_AMBULATORY_CARE_PROVIDER_SITE_OTHER): Payer: Managed Care, Other (non HMO) | Admitting: *Deleted

## 2017-04-08 DIAGNOSIS — J309 Allergic rhinitis, unspecified: Secondary | ICD-10-CM

## 2017-04-13 ENCOUNTER — Ambulatory Visit (INDEPENDENT_AMBULATORY_CARE_PROVIDER_SITE_OTHER): Payer: Managed Care, Other (non HMO) | Admitting: *Deleted

## 2017-04-13 DIAGNOSIS — J309 Allergic rhinitis, unspecified: Secondary | ICD-10-CM | POA: Diagnosis not present

## 2017-04-19 ENCOUNTER — Encounter: Payer: Self-pay | Admitting: Internal Medicine

## 2017-04-19 ENCOUNTER — Ambulatory Visit (INDEPENDENT_AMBULATORY_CARE_PROVIDER_SITE_OTHER): Payer: 59 | Admitting: Internal Medicine

## 2017-04-19 VITALS — BP 124/80 | HR 101 | Wt 399.0 lb

## 2017-04-19 DIAGNOSIS — E041 Nontoxic single thyroid nodule: Secondary | ICD-10-CM

## 2017-04-19 DIAGNOSIS — E063 Autoimmune thyroiditis: Secondary | ICD-10-CM | POA: Diagnosis not present

## 2017-04-19 NOTE — Patient Instructions (Signed)
Please stop at the lab.  Please return in 1 year.  
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
## 2017-04-20 ENCOUNTER — Ambulatory Visit (INDEPENDENT_AMBULATORY_CARE_PROVIDER_SITE_OTHER): Payer: Managed Care, Other (non HMO) | Admitting: *Deleted

## 2017-04-20 DIAGNOSIS — J309 Allergic rhinitis, unspecified: Secondary | ICD-10-CM | POA: Diagnosis not present

## 2017-04-20 LAB — TSH: TSH: 0.49 u[IU]/mL (ref 0.35–4.50)

## 2017-04-20 LAB — T3, FREE: T3, Free: 4.6 pg/mL — ABNORMAL HIGH (ref 2.3–4.2)

## 2017-04-20 LAB — T4, FREE: Free T4: 0.85 ng/dL (ref 0.60–1.60)

## 2017-04-21 ENCOUNTER — Telehealth: Payer: Self-pay

## 2017-04-21 NOTE — Telephone Encounter (Signed)
Called patient and gave lab results. Patient had no questions or concerns.  

## 2017-04-21 NOTE — Telephone Encounter (Signed)
-----   Message from Philemon Kingdom, MD sent at 04/20/2017  5:45 PM EDT ----- Almyra Free, can you please call pt: TSH and free T4 are normal, while her free T3 is slightly elevated. No intervention is needed for now  >> we only need to repeat the test when she comes back in a year.

## 2017-04-27 ENCOUNTER — Ambulatory Visit (INDEPENDENT_AMBULATORY_CARE_PROVIDER_SITE_OTHER): Payer: Managed Care, Other (non HMO) | Admitting: *Deleted

## 2017-04-27 DIAGNOSIS — J309 Allergic rhinitis, unspecified: Secondary | ICD-10-CM | POA: Diagnosis not present

## 2017-05-11 ENCOUNTER — Ambulatory Visit (INDEPENDENT_AMBULATORY_CARE_PROVIDER_SITE_OTHER): Payer: Managed Care, Other (non HMO) | Admitting: *Deleted

## 2017-05-11 DIAGNOSIS — J309 Allergic rhinitis, unspecified: Secondary | ICD-10-CM

## 2017-05-18 ENCOUNTER — Ambulatory Visit (INDEPENDENT_AMBULATORY_CARE_PROVIDER_SITE_OTHER): Payer: Managed Care, Other (non HMO) | Admitting: *Deleted

## 2017-05-18 DIAGNOSIS — J309 Allergic rhinitis, unspecified: Secondary | ICD-10-CM | POA: Diagnosis not present

## 2017-05-25 ENCOUNTER — Ambulatory Visit (INDEPENDENT_AMBULATORY_CARE_PROVIDER_SITE_OTHER): Payer: Managed Care, Other (non HMO) | Admitting: *Deleted

## 2017-05-25 DIAGNOSIS — J309 Allergic rhinitis, unspecified: Secondary | ICD-10-CM | POA: Diagnosis not present

## 2017-06-01 ENCOUNTER — Ambulatory Visit (INDEPENDENT_AMBULATORY_CARE_PROVIDER_SITE_OTHER): Payer: Managed Care, Other (non HMO) | Admitting: *Deleted

## 2017-06-01 DIAGNOSIS — J309 Allergic rhinitis, unspecified: Secondary | ICD-10-CM | POA: Diagnosis not present

## 2017-06-01 NOTE — Progress Notes (Signed)
VIALS EXP 06-04-18  HC/JM

## 2017-06-04 DIAGNOSIS — J301 Allergic rhinitis due to pollen: Secondary | ICD-10-CM | POA: Diagnosis not present

## 2017-06-08 ENCOUNTER — Ambulatory Visit (INDEPENDENT_AMBULATORY_CARE_PROVIDER_SITE_OTHER): Payer: Managed Care, Other (non HMO) | Admitting: *Deleted

## 2017-06-08 DIAGNOSIS — J309 Allergic rhinitis, unspecified: Secondary | ICD-10-CM | POA: Diagnosis not present

## 2017-06-15 ENCOUNTER — Ambulatory Visit (INDEPENDENT_AMBULATORY_CARE_PROVIDER_SITE_OTHER): Payer: Managed Care, Other (non HMO) | Admitting: *Deleted

## 2017-06-15 DIAGNOSIS — J309 Allergic rhinitis, unspecified: Secondary | ICD-10-CM

## 2017-06-22 ENCOUNTER — Ambulatory Visit (INDEPENDENT_AMBULATORY_CARE_PROVIDER_SITE_OTHER): Payer: Managed Care, Other (non HMO) | Admitting: *Deleted

## 2017-06-22 DIAGNOSIS — J309 Allergic rhinitis, unspecified: Secondary | ICD-10-CM | POA: Diagnosis not present

## 2017-06-23 ENCOUNTER — Ambulatory Visit (INDEPENDENT_AMBULATORY_CARE_PROVIDER_SITE_OTHER): Payer: Managed Care, Other (non HMO) | Admitting: Podiatry

## 2017-06-23 ENCOUNTER — Ambulatory Visit: Payer: Managed Care, Other (non HMO)

## 2017-06-23 ENCOUNTER — Encounter: Payer: Self-pay | Admitting: Podiatry

## 2017-06-23 VITALS — BP 130/72 | HR 122 | Resp 16

## 2017-06-23 DIAGNOSIS — M722 Plantar fascial fibromatosis: Secondary | ICD-10-CM

## 2017-06-23 DIAGNOSIS — M79672 Pain in left foot: Secondary | ICD-10-CM

## 2017-06-23 MED ORDER — TRIAMCINOLONE ACETONIDE 10 MG/ML IJ SUSP
10.0000 mg | Freq: Once | INTRAMUSCULAR | Status: AC
  Administered 2017-06-23: 10 mg

## 2017-06-24 ENCOUNTER — Telehealth: Payer: Self-pay | Admitting: Allergy and Immunology

## 2017-06-24 NOTE — Telephone Encounter (Signed)
Please call patient back regarding billing issue as she stated that she is returning a call to our billing office from Friday. Thanks

## 2017-06-24 NOTE — Progress Notes (Signed)
Subjective:    Patient ID: Brittany Riggs, female   DOB: 46 y.o.   MRN: [id number]   HPI patient states my medial heel is doing well but I still gets some pain on the outside of the tendon    ROS      Objective:  Physical Exam neurovascular status intact with inflammation lateral side of the fascia with obesity is complicating factor     Assessment:   Fasciitis left lateral with excellent healing from previous surgery left      Plan:    Careful lateral injection administered 3 mg Kenalog 5 g Xylocaine advised on supportive shoe gear and not going barefoot

## 2017-06-24 NOTE — Telephone Encounter (Signed)
Danielle called pt last week - she will call her back today

## 2017-06-25 DIAGNOSIS — J309 Allergic rhinitis, unspecified: Secondary | ICD-10-CM

## 2017-06-25 NOTE — Progress Notes (Signed)
This encounter was created in error - please disregard.

## 2017-07-01 ENCOUNTER — Ambulatory Visit (INDEPENDENT_AMBULATORY_CARE_PROVIDER_SITE_OTHER): Payer: Managed Care, Other (non HMO) | Admitting: *Deleted

## 2017-07-01 DIAGNOSIS — J309 Allergic rhinitis, unspecified: Secondary | ICD-10-CM | POA: Diagnosis not present

## 2017-07-05 ENCOUNTER — Telehealth: Payer: Self-pay | Admitting: Pediatrics

## 2017-07-05 NOTE — Telephone Encounter (Signed)
Pt called to follow up on the previous conversation with our billing department in regards to a call she received from [place] about claims not being paid since February. Please call patient back. Thanks

## 2017-07-05 NOTE — Telephone Encounter (Signed)
Danielle spoke with the patient - she will call her back - kt

## 2017-07-07 NOTE — Telephone Encounter (Signed)
Talked with patient today regarding ins claims.....going to look into it a little deeper.  Her Ins says they have paid them. But no checks have been returned to them as cashed. Told her I would call her back again when I found out more concrete info

## 2017-07-09 ENCOUNTER — Ambulatory Visit (INDEPENDENT_AMBULATORY_CARE_PROVIDER_SITE_OTHER): Payer: Managed Care, Other (non HMO)

## 2017-07-09 DIAGNOSIS — J309 Allergic rhinitis, unspecified: Secondary | ICD-10-CM | POA: Diagnosis not present

## 2017-07-15 ENCOUNTER — Ambulatory Visit (INDEPENDENT_AMBULATORY_CARE_PROVIDER_SITE_OTHER): Payer: Managed Care, Other (non HMO)

## 2017-07-15 DIAGNOSIS — J309 Allergic rhinitis, unspecified: Secondary | ICD-10-CM | POA: Diagnosis not present

## 2017-07-20 ENCOUNTER — Ambulatory Visit (INDEPENDENT_AMBULATORY_CARE_PROVIDER_SITE_OTHER): Payer: Managed Care, Other (non HMO)

## 2017-07-20 DIAGNOSIS — J309 Allergic rhinitis, unspecified: Secondary | ICD-10-CM | POA: Diagnosis not present

## 2017-07-29 ENCOUNTER — Ambulatory Visit (INDEPENDENT_AMBULATORY_CARE_PROVIDER_SITE_OTHER): Payer: Managed Care, Other (non HMO) | Admitting: *Deleted

## 2017-07-29 DIAGNOSIS — J309 Allergic rhinitis, unspecified: Secondary | ICD-10-CM | POA: Diagnosis not present

## 2017-08-03 ENCOUNTER — Ambulatory Visit (INDEPENDENT_AMBULATORY_CARE_PROVIDER_SITE_OTHER): Payer: Managed Care, Other (non HMO) | Admitting: *Deleted

## 2017-08-03 DIAGNOSIS — J309 Allergic rhinitis, unspecified: Secondary | ICD-10-CM | POA: Diagnosis not present

## 2017-08-10 ENCOUNTER — Ambulatory Visit (INDEPENDENT_AMBULATORY_CARE_PROVIDER_SITE_OTHER): Payer: Managed Care, Other (non HMO) | Admitting: *Deleted

## 2017-08-10 DIAGNOSIS — J309 Allergic rhinitis, unspecified: Secondary | ICD-10-CM

## 2017-08-19 ENCOUNTER — Ambulatory Visit (INDEPENDENT_AMBULATORY_CARE_PROVIDER_SITE_OTHER): Payer: Managed Care, Other (non HMO) | Admitting: *Deleted

## 2017-08-19 DIAGNOSIS — J309 Allergic rhinitis, unspecified: Secondary | ICD-10-CM | POA: Diagnosis not present

## 2017-08-26 ENCOUNTER — Ambulatory Visit (INDEPENDENT_AMBULATORY_CARE_PROVIDER_SITE_OTHER): Payer: Managed Care, Other (non HMO) | Admitting: *Deleted

## 2017-08-26 DIAGNOSIS — J309 Allergic rhinitis, unspecified: Secondary | ICD-10-CM | POA: Diagnosis not present

## 2017-08-31 ENCOUNTER — Ambulatory Visit (INDEPENDENT_AMBULATORY_CARE_PROVIDER_SITE_OTHER): Payer: Managed Care, Other (non HMO) | Admitting: *Deleted

## 2017-08-31 DIAGNOSIS — J309 Allergic rhinitis, unspecified: Secondary | ICD-10-CM

## 2017-09-02 NOTE — Progress Notes (Signed)
VIALS EXP 09-03-18

## 2017-09-06 DIAGNOSIS — J301 Allergic rhinitis due to pollen: Secondary | ICD-10-CM | POA: Diagnosis not present

## 2017-09-14 ENCOUNTER — Ambulatory Visit (INDEPENDENT_AMBULATORY_CARE_PROVIDER_SITE_OTHER): Payer: Managed Care, Other (non HMO) | Admitting: *Deleted

## 2017-09-14 DIAGNOSIS — J309 Allergic rhinitis, unspecified: Secondary | ICD-10-CM

## 2017-09-28 ENCOUNTER — Ambulatory Visit (INDEPENDENT_AMBULATORY_CARE_PROVIDER_SITE_OTHER): Payer: Managed Care, Other (non HMO) | Admitting: *Deleted

## 2017-09-28 DIAGNOSIS — J309 Allergic rhinitis, unspecified: Secondary | ICD-10-CM

## 2017-10-05 ENCOUNTER — Ambulatory Visit (INDEPENDENT_AMBULATORY_CARE_PROVIDER_SITE_OTHER): Payer: Managed Care, Other (non HMO) | Admitting: *Deleted

## 2017-10-05 DIAGNOSIS — J309 Allergic rhinitis, unspecified: Secondary | ICD-10-CM | POA: Diagnosis not present

## 2017-10-12 ENCOUNTER — Ambulatory Visit (INDEPENDENT_AMBULATORY_CARE_PROVIDER_SITE_OTHER): Payer: Managed Care, Other (non HMO) | Admitting: *Deleted

## 2017-10-12 DIAGNOSIS — J309 Allergic rhinitis, unspecified: Secondary | ICD-10-CM | POA: Diagnosis not present

## 2017-10-13 ENCOUNTER — Ambulatory Visit: Payer: Managed Care, Other (non HMO)

## 2017-10-13 ENCOUNTER — Encounter: Payer: Self-pay | Admitting: Podiatry

## 2017-10-13 ENCOUNTER — Ambulatory Visit (INDEPENDENT_AMBULATORY_CARE_PROVIDER_SITE_OTHER): Payer: Managed Care, Other (non HMO) | Admitting: Podiatry

## 2017-10-13 DIAGNOSIS — M779 Enthesopathy, unspecified: Secondary | ICD-10-CM | POA: Diagnosis not present

## 2017-10-13 DIAGNOSIS — M722 Plantar fascial fibromatosis: Secondary | ICD-10-CM | POA: Diagnosis not present

## 2017-10-13 DIAGNOSIS — M25572 Pain in left ankle and joints of left foot: Secondary | ICD-10-CM

## 2017-10-13 MED ORDER — TRIAMCINOLONE ACETONIDE 10 MG/ML IJ SUSP
10.0000 mg | Freq: Once | INTRAMUSCULAR | Status: AC
  Administered 2017-10-13: 10 mg
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
## 2017-10-28 ENCOUNTER — Ambulatory Visit (INDEPENDENT_AMBULATORY_CARE_PROVIDER_SITE_OTHER): Payer: Managed Care, Other (non HMO) | Admitting: *Deleted

## 2017-10-28 DIAGNOSIS — J309 Allergic rhinitis, unspecified: Secondary | ICD-10-CM | POA: Diagnosis not present

## 2017-11-02 ENCOUNTER — Ambulatory Visit: Payer: Self-pay | Admitting: *Deleted

## 2017-11-03 ENCOUNTER — Encounter: Payer: Self-pay | Admitting: Allergy and Immunology

## 2017-11-03 ENCOUNTER — Ambulatory Visit: Payer: Managed Care, Other (non HMO) | Admitting: Allergy and Immunology

## 2017-11-03 VITALS — BP 132/80 | HR 106 | Temp 98.3°F | Resp 19

## 2017-11-03 DIAGNOSIS — K219 Gastro-esophageal reflux disease without esophagitis: Secondary | ICD-10-CM | POA: Diagnosis not present

## 2017-11-03 DIAGNOSIS — J3089 Other allergic rhinitis: Secondary | ICD-10-CM | POA: Diagnosis not present

## 2017-11-03 DIAGNOSIS — J4541 Moderate persistent asthma with (acute) exacerbation: Secondary | ICD-10-CM

## 2017-11-03 MED ORDER — BECLOMETHASONE DIPROP HFA 80 MCG/ACT IN AERB: 2.0000 | INHALATION_SPRAY | Freq: Two times a day (BID) | RESPIRATORY_TRACT | 0 refills | Status: DC

## 2017-11-03 MED ORDER — METHYLPREDNISOLONE ACETATE 80 MG/ML IJ SUSP
80.0000 mg | Freq: Once | INTRAMUSCULAR | Status: AC
  Administered 2017-11-03: 80 mg via INTRAMUSCULAR

## 2017-11-03 NOTE — Progress Notes (Signed)
Follow-up Note  Referring Provider: [name], MD Primary Provider: [name], MD Date of Office Visit: 11/03/2017  Subjective:   Brittany Riggs (DOB: July 18, 1971) is a 46 y.o. female who returns to the [facility] [place] on 11/03/2017 in re-evaluation of the following:  HPI: Brittany Riggs presents to this clinic in evaluation of respiratory tract problems that have been in existence for approximately 3 years.  She is followed in this clinic for a history of asthma and allergic rhinitis and LPR.  I have never seen her in this clinic but her last visit was with Dr. Verlin Fester was on 23 June 2016.  Approximately 3 weeks ago she developed stuffy nose and sneezing and coughing and this has been a persistent issue since that point in time.  Now when she coughs her ears hurt.  She is having dry heaving and is almost vomiting.  She does not have any anosmia or ugly nasal discharge or ugly sputum production or chest pain.  She is having disturbed sleep because of her cough.  She does not respond to a short acting bronchodilator.  Apparently she was treated with a Z-Pak.  She continues on anti-inflammatory medications for respiratory tract and therapy directed against reflux.  Her immunotherapy has been going well currently at every 2 weeks without any incident.  Allergies as of 11/03/2017      Reactions   Amoxicillin Hives   Codeine Itching   Penicillins Hives   Prednisone Other (See Comments)   Severe cramps and "spasms"   Sulfa Antibiotics Other (See Comments)   Does not remember side effects.       Medication List      AMLODIPINE BESYLATE PO Take 10 mg by mouth.   Azelastine HCl 0.15 % Soln Place 2 sprays into the nose 2 (two) times daily.   Azelastine-Fluticasone 137-50 MCG/ACT Susp Place 1 spray into the nose 2 (two) times daily.   celecoxib 200 MG capsule Commonly known as:  CELEBREX Take 200 mg by mouth 2 (two) times daily.   chlorpheniramine 4 MG  tablet Commonly known as:  CHLOR-TRIMETON Take 4 mg by mouth at bedtime.   cyclobenzaprine 10 MG tablet Commonly known as:  FLEXERIL   DELSYM 30 MG/5ML liquid Generic drug:  dextromethorphan as needed for cough. Reported on 06/23/2016   EPINEPHrine 0.3 mg/0.3 mL Soaj injection Commonly known as:  EPI-PEN Use as directed for a severe allergic reaction.   Eszopiclone 3 MG Tabs Take 3 mg by mouth as needed. Take immediately before bedtime   famotidine 20 MG tablet Commonly known as:  PEPCID Take 20 mg by mouth at bedtime. Reported on 06/23/2016   fluticasone 50 MCG/ACT nasal spray Commonly known as:  FLONASE Place 1 spray into both nostrils 2 (two) times daily.   FLUTTER Devi Use as directed   gabapentin 100 MG capsule Commonly known as:  NEURONTIN Take 1 capsule (100 mg total) by mouth 3 (three) times daily. One three times daily   LEVAQUIN 500 MG tablet Generic drug:  levofloxacin Take 500 mg by mouth daily.   levonorgestrel-ethinyl estradiol 0.15-0.03 MG tablet Commonly known as:  SEASONALE,INTROVALE,JOLESSA Take 1 tablet by mouth daily.   loratadine 10 MG tablet Commonly known as:  CLARITIN Take 10 mg by mouth daily. Reported on 06/23/2016   meperidine 50 MG tablet Commonly known as:  DEMEROL Take 50 mg by mouth every 4 (four) hours as needed for severe pain.   mometasone-formoterol 200-5 MCG/ACT Aero Commonly  known as:  DULERA Inhale 2 puffs into the lungs 2 (two) times daily.   montelukast 10 MG tablet Commonly known as:  SINGULAIR Take 1 tablet (10 mg total) by mouth at bedtime.   omeprazole 40 MG capsule Commonly known as:  PRILOSEC Take 40 mg by mouth daily.   PROAIR HFA 108 (90 Base) MCG/ACT inhaler Generic drug:  albuterol Inhale 2 puffs into the lungs every 6 (six) hours as needed for wheezing or shortness of breath.   promethazine 25 MG tablet Commonly known as:  PHENERGAN Take 25 mg by mouth every 4 (four) hours as needed for nausea or  vomiting.   traMADol 50 MG tablet Commonly known as:  ULTRAM 1-2 every 4 hours as needed for cough or pain   venlafaxine XR 150 MG 24 hr capsule Commonly known as:  EFFEXOR-XR Take 150 mg by mouth daily with breakfast.   VITAMIN D3 PO Take 1,000 Units by mouth.   XELJANZ XR 11 MG Tb24 Generic drug:  Tofacitinib Citrate Take by mouth.       Past Medical History:  Diagnosis Date  . Allergy   . Anemia   . Asthma   . Hypertension   . IBS (irritable bowel syndrome)   . Migraine   . Muscle cramp   . Obesity   . Plantar fasciitis   . Rheumatoid arthritis ([place])   . Vitamin D deficiency     No past surgical history on file.  Review of systems negative except as noted in HPI / PMHx or noted below:  Review of Systems  Constitutional: Negative.   HENT: Negative.   Eyes: Negative.   Respiratory: Negative.   Cardiovascular: Negative.   Gastrointestinal: Negative.   Genitourinary: Negative.   Musculoskeletal: Negative.   Skin: Negative.   Neurological: Negative.   Endo/Heme/Allergies: Negative.   Psychiatric/Behavioral: Negative.      Objective:   Vitals:   11/03/17 0837  BP: 132/80  Pulse: (!) 106  Resp: 19  Temp: 98.3 F (36.8 C)  SpO2: 97%          Physical Exam  Constitutional: She is well-developed, well-nourished, and in no distress.  Extremely raspy voice, cough,  HENT:  Head: Normocephalic.  Right Ear: Tympanic membrane, external ear and ear canal normal.  Left Ear: Tympanic membrane, external ear and ear canal normal.  Nose: Mucosal edema present. No rhinorrhea.  Mouth/Throat: Uvula is midline, oropharynx is clear and moist and mucous membranes are normal. No oropharyngeal exudate.  Eyes: Conjunctivae are normal.  Neck: Trachea normal. No tracheal tenderness present. No tracheal deviation present. No thyromegaly present.  Cardiovascular: Normal rate, regular rhythm, S1 normal, S2 normal and normal heart sounds.  No murmur  heard. Pulmonary/Chest: Breath sounds normal. No stridor. No respiratory distress. She has no wheezes. She has no rales.  Musculoskeletal: She exhibits no edema.  Lymphadenopathy:       Head (right side): No tonsillar adenopathy present.       Head (left side): No tonsillar adenopathy present.    She has no cervical adenopathy.  Neurological: She is alert. Gait normal.  Skin: No rash noted. She is not diaphoretic. No erythema. Nails show no clubbing.  Psychiatric: Mood and affect normal.    Diagnostics:    Spirometry was performed and demonstrated an FEV1 of 1.70 at 57 % of predicted.  Following the administration of nebulized albuterol and ipratropium her FEV1 rose to 1.96 which was an increase in the FEV1 of 15%.  The  patient had an Asthma Control Test with the following results: ACT Total Score: 15.    Assessment and Plan:   1. Asthma, not well controlled, moderate persistent, with acute exacerbation   2. LPRD (laryngopharyngeal reflux disease)   3. Other allergic rhinitis     1.  Depo-Medrol 80 IM delivered in clinic  2.  Add Qvar 80 Redihaler 2 inhalations twice a day to Dulera 200 - 2 inhalations twice a day while "sick".  When better discontinue Qvar  3.  Increase therapy for reflux with the following:   A.  Increase omeprazole 40 mg to twice a day  B.  Restart famotidine 40 mg in evening  4.  If needed:   A.  Pro Air HFA 2 puffs every 4-6 hours  B.  Nasal saline spray multiple times a day  C.  OTC Mucinex DM 2 tablets twice a day  D.  Antihistamine  5.  Antibiotic?  Yes if not responding to plan noted above  6.  Continue immunotherapy and EpiPen  7.  Return to clinic in 3 months or earlier if problem  It sounds as though Brittany Riggs has developed some form of viral respiratory tract infection with a fair amount of inflammation of her airway and I am going to treat her with anti-inflammatory agents as noted above in addition to addressing the issue with her reflux as  she does have a fair amount of posttussive emesis.  If she does not respond to this plan she may require an additional antibiotic.  She will keep in contact with [place] noting her response over the next several days to weeks and if she does well she can return to this clinic in 3 months or earlier if there is a problem.  Allena Katz, MD Allergy / Immunology [place]

## 2017-11-03 NOTE — Patient Instructions (Addendum)
  1.  Depo-Medrol 80 IM delivered in clinic  2.  Add Qvar 80 Redihaler 2 inhalations twice a day to Dulera 200 - 2 inhalations twice a day while "sick".  When better discontinue Qvar  3.  Increase therapy for reflux with the following:   A.  Increase omeprazole 40 mg to twice a day  B.  Restart famotidine 40 mg in evening  4.  If needed:   A.  Pro Air HFA 2 puffs every 4-6 hours  B.  Nasal saline spray multiple times a day  C.  OTC Mucinex DM 2 tablets twice a day  D.  Antihistamine  5.  Antibiotic?  Yes if not responding to plan noted above  6.  Continue immunotherapy and EpiPen  7.  Return to clinic in 3 months or earlier if problem

## 2017-11-08 ENCOUNTER — Encounter: Payer: Self-pay | Admitting: Allergy and Immunology

## 2017-11-08 ENCOUNTER — Encounter: Payer: Self-pay | Admitting: Podiatry

## 2017-11-08 ENCOUNTER — Ambulatory Visit: Payer: Managed Care, Other (non HMO) | Admitting: Podiatry

## 2017-11-08 DIAGNOSIS — M779 Enthesopathy, unspecified: Secondary | ICD-10-CM

## 2017-11-08 NOTE — Progress Notes (Signed)
F/O ordered:  Graphite w/ heel punch/cushion, 4* lateral RF skive to relieve pressure sinsu  Tarsi  Dawn to verify coverage before charges are put in.

## 2017-11-10 ENCOUNTER — Telehealth: Payer: Self-pay | Admitting: Podiatry

## 2017-11-10 ENCOUNTER — Ambulatory Visit: Payer: Managed Care, Other (non HMO) | Admitting: Podiatry

## 2017-11-10 NOTE — Telephone Encounter (Signed)
Left voicemail for pt to call me to discuss insurance coverage for the orthotics.

## 2017-11-10 NOTE — Telephone Encounter (Signed)
Pt called back and is wanting to check her flex spending account and will let us know if she wants [place] to order.

## 2017-11-11 ENCOUNTER — Ambulatory Visit (INDEPENDENT_AMBULATORY_CARE_PROVIDER_SITE_OTHER): Payer: Managed Care, Other (non HMO) | Admitting: *Deleted

## 2017-11-11 DIAGNOSIS — J309 Allergic rhinitis, unspecified: Secondary | ICD-10-CM | POA: Diagnosis not present

## 2017-11-16 ENCOUNTER — Ambulatory Visit (INDEPENDENT_AMBULATORY_CARE_PROVIDER_SITE_OTHER): Payer: Managed Care, Other (non HMO) | Admitting: *Deleted

## 2017-11-16 DIAGNOSIS — J309 Allergic rhinitis, unspecified: Secondary | ICD-10-CM

## 2017-11-22 ENCOUNTER — Encounter (HOSPITAL_COMMUNITY): Payer: Self-pay

## 2017-11-22 ENCOUNTER — Emergency Department (HOSPITAL_COMMUNITY)
Admission: EM | Admit: 2017-11-22 | Discharge: 2017-11-22 | Disposition: A | Discharge status: Home / Self Care | Payer: 59 | Attending: Emergency Medicine | Admitting: Emergency Medicine

## 2017-11-22 ENCOUNTER — Emergency Department (HOSPITAL_BASED_OUTPATIENT_CLINIC_OR_DEPARTMENT_OTHER)
Admit: 2017-11-22 | Discharge: 2017-11-22 | Disposition: A | Discharge status: Home / Self Care | Payer: 59 | Attending: Emergency Medicine | Admitting: Emergency Medicine

## 2017-11-22 DIAGNOSIS — M79609 Pain in unspecified limb: Secondary | ICD-10-CM

## 2017-11-22 DIAGNOSIS — I10 Essential (primary) hypertension: Secondary | ICD-10-CM | POA: Insufficient documentation

## 2017-11-22 DIAGNOSIS — M79604 Pain in right leg: Secondary | ICD-10-CM | POA: Diagnosis present

## 2017-11-22 DIAGNOSIS — J45909 Unspecified asthma, uncomplicated: Secondary | ICD-10-CM | POA: Insufficient documentation

## 2017-11-22 DIAGNOSIS — Z79899 Other long term (current) drug therapy: Secondary | ICD-10-CM | POA: Diagnosis not present

## 2017-11-22 DIAGNOSIS — M5431 Sciatica, right side: Secondary | ICD-10-CM | POA: Diagnosis not present

## 2017-11-22 MED ORDER — KETOROLAC TROMETHAMINE 30 MG/ML IJ SOLN
30.0000 mg | Freq: Once | INTRAMUSCULAR | Status: AC
  Administered 2017-11-22: 30 mg via INTRAMUSCULAR
  Filled 2017-11-22: qty 1

## 2017-11-22 MED ORDER — OXYCODONE-ACETAMINOPHEN 5-325 MG PO TABS: 1.0000 | ORAL_TABLET | Freq: Once | ORAL | Status: DC

## 2017-11-22 MED ORDER — CYCLOBENZAPRINE HCL 10 MG PO TABS: 10.0000 mg | ORAL_TABLET | Freq: Three times a day (TID) | ORAL | 0 refills | Status: DC | PRN

## 2017-11-22 MED ORDER — METAXALONE 800 MG PO TABS
400.0000 mg | ORAL_TABLET | Freq: Once | ORAL | Status: AC
  Administered 2017-11-22: 400 mg via ORAL
  Filled 2017-11-22: qty 1

## 2017-11-22 NOTE — ED Provider Notes (Signed)
[facility] EMERGENCY DEPARTMENT Provider Note   CSN: [id number] Arrival date & time: 11/22/17  0932     History   Chief Complaint Chief Complaint  Patient presents with  . Hip Pain    HPI Brittany Riggs is a 46 y.o. female.  HPI Patient presents with pain in her right lower back and right lower extremity.  Has had for the last several days.  Got seen by her rheumatologist thinking it was a rheumatoid arthritis flare.  States she got a steroid injection did not help the pain.  She states normally this would have helped the pain.  Is dull.  Worse with movements.  Worse with standing.  No chest pain or trouble breathing.  No fevers.  No trauma.  Feels like a rheumatoid arthritis flare except it is lasted longer.  Pain is mostly in the ankle and the right hip area but it will go away down the leg.  No relief with hydrocodone Past Medical History:  Diagnosis Date  . Allergy   . Anemia   . Asthma   . Hypertension   . IBS (irritable bowel syndrome)   . Migraine   . Muscle cramp   . Obesity   . Plantar fasciitis   . Rheumatoid arthritis ([place])   . Vitamin D deficiency     Patient Active Problem List   Diagnosis Date Noted  . Thyroid nodule 10/19/2016  . Hashimoto's disease 10/19/2016  . Persistent cough 06/23/2016  . Seasonal and perennial allergic rhinitis 06/23/2016  . GERD (gastroesophageal reflux disease) 06/23/2016  . Cough variant asthma 03/13/2016  . Severe obesity (BMI >= 40) ([place]) 03/13/2016  . Upper airway cough syndrome 03/12/2016    History reviewed. No pertinent surgical history.  OB History    No data available       Home Medications    Prior to Admission medications   Medication Sig Start Date End Date Taking? Authorizing Provider  albuterol (PROAIR HFA) 108 (90 Base) MCG/ACT inhaler Inhale 2 puffs into the lungs every 6 (six) hours as needed for wheezing or shortness of breath.    [provider]  AMLODIPINE BESYLATE  PO Take 10 mg by mouth.    [provider]  Azelastine HCl 0.15 % SOLN Place 2 sprays into the nose 2 (two) times daily. 06/30/16   Bobbitt, Sedalia Muta, MD  Azelastine-Fluticasone 137-50 MCG/ACT SUSP Place 1 spray into the nose 2 (two) times daily. 06/23/16   Bobbitt, Sedalia Muta, MD  beclomethasone (QVAR REDIHALER) 80 MCG/ACT inhaler Inhale 2 puffs into the lungs 2 (two) times daily. 11/03/17   Kozlow, Donnamarie Poag, MD  celecoxib (CELEBREX) 200 MG capsule Take 200 mg by mouth 2 (two) times daily.    [provider]  chlorpheniramine (CHLOR-TRIMETON) 4 MG tablet Take 4 mg by mouth at bedtime.    [provider]  Cholecalciferol (VITAMIN D3 PO) Take 1,000 Units by mouth.    [provider]  cyclobenzaprine (FLEXERIL) 10 MG tablet Take 1 tablet (10 mg total) by mouth 3 (three) times daily as needed for muscle spasms. 11/22/17   Davonna Belling, MD  dextromethorphan (DELSYM) 30 MG/5ML liquid as needed for cough. Reported on 06/23/2016    [provider]  EPINEPHrine 0.3 mg/0.3 mL IJ SOAJ injection Use as directed for a severe allergic reaction. 07/02/16   Bobbitt, Sedalia Muta, MD  Eszopiclone 3 MG TABS Take 3 mg by mouth as needed. Take immediately before bedtime  [provider]  famotidine (PEPCID) 20 MG tablet Take 20 mg by mouth at bedtime. Reported on 06/23/2016    [provider]  fluticasone (FLONASE) 50 MCG/ACT nasal spray Place 1 spray into both nostrils 2 (two) times daily. 06/30/16   Bobbitt, Sedalia Muta, MD  gabapentin (NEURONTIN) 100 MG capsule Take 1 capsule (100 mg total) by mouth 3 (three) times daily. One three times daily 05/15/16   Tanda Rockers, MD  levofloxacin (LEVAQUIN) 500 MG tablet Take 500 mg by mouth daily.    [provider]  levonorgestrel-ethinyl estradiol (SEASONALE,INTROVALE,JOLESSA) 0.15-0.03 MG tablet Take 1 tablet by mouth daily.    [provider]  loratadine (CLARITIN) 10 MG tablet Take 10  mg by mouth daily. Reported on 06/23/2016    [provider]  meperidine (DEMEROL) 50 MG tablet Take 50 mg by mouth every 4 (four) hours as needed for severe pain.    Wallene Huh, DPM  mometasone-formoterol (DULERA) 200-5 MCG/ACT AERO Inhale 2 puffs into the lungs 2 (two) times daily.    [provider]  montelukast (SINGULAIR) 10 MG tablet Take 1 tablet (10 mg total) by mouth at bedtime. 03/12/16   Tanda Rockers, MD  omeprazole (PRILOSEC) 40 MG capsule Take 40 mg by mouth daily.    [provider]  promethazine (PHENERGAN) 25 MG tablet Take 25 mg by mouth every 4 (four) hours as needed for nausea or vomiting.    Wallene Huh, DPM  Respiratory Therapy Supplies (FLUTTER) DEVI Use as directed 03/26/16   Tanda Rockers, MD  Tofacitinib Citrate (XELJANZ XR) 11 MG TB24 Take by mouth.    [provider]  traMADol Veatrice Bourbon) 50 MG tablet 1-2 every 4 hours as needed for cough or pain 05/15/16   Tanda Rockers, MD  venlafaxine XR (EFFEXOR-XR) 150 MG 24 hr capsule Take 150 mg by mouth daily with breakfast.    [provider]    Family History Family History  Problem Relation Age of Onset  . Asthma Sister   . Hypertension Sister   . Hypercholesterolemia Sister   . Sleep apnea Sister   . Arthritis Sister   . Diabetes Sister   . Allergies Sister   . Rheum arthritis Mother   . Hypertension Mother   . Diabetes Mother   . Rheum arthritis Sister   . Kidney disease Father   . Colon cancer Father   . Breast cancer Paternal Grandmother   . Colon cancer Paternal Grandfather     Social History Social History   Tobacco Use  . Smoking status: Never Smoker  . Smokeless tobacco: Never Used  Substance Use Topics  . Alcohol use: No    Alcohol/week: 0.0 oz  . Drug use: No     Allergies   Amoxicillin; Codeine; Penicillins; Prednisone; and Sulfa antibiotics   Review of Systems Review of Systems  Constitutional: Negative for appetite change and  fever.  HENT: Negative for congestion.   Respiratory: Negative for chest tightness and shortness of breath.   Cardiovascular: Negative for chest pain.  Gastrointestinal: Negative for abdominal pain.  Genitourinary: Negative for dyspareunia and frequency.  Musculoskeletal: Positive for back pain and gait problem. Negative for joint swelling and neck pain.  Skin: Negative for rash.  Neurological: Negative for headaches.  Hematological: Negative for adenopathy.  Psychiatric/Behavioral: Negative for confusion.     Physical Exam Updated Vital Signs BP (!) 181/98 (BP Location: Right Arm)   Pulse (!) 116  Temp 98.3 F (36.8 C) (Oral)   Resp (!) 21   Ht 5\' 10"  (1.778 m)   Wt (!) 141.1 kg (311 lb)   SpO2 100%   BMI 44.62 kg/m   Physical Exam  Constitutional: She appears well-developed.  HENT:  Head: Atraumatic.  Neck: Neck supple.  Cardiovascular:  Tachycardia  Pulmonary/Chest: Effort normal.  Abdominal: There is no tenderness.  Musculoskeletal:  Tenderness over right SI area.  Right hip tender laterally but no pain with rotation at the hip.  Overall good range of motion in the hip.  Some tenderness over calf.  Some pain with movement at the ankle.  Mild edema.  Good capillary refill.  Neurological: She is alert.  Skin: Skin is warm. Capillary refill takes less than 2 seconds.  Psychiatric: She has a normal mood and affect.     ED Treatments / Results  Labs (all labs ordered are listed, but only abnormal results are displayed) Labs Reviewed - No data to display  EKG  EKG Interpretation None       Radiology No results found.  Procedures Procedures (including critical care time)  Medications Ordered in ED Medications  oxyCODONE-acetaminophen (PERCOCET/ROXICET) 5-325 MG per tablet 1 tablet (1 tablet Oral Not Given 11/22/17 1228)  metaxalone (SKELAXIN) tablet 400 mg (400 mg Oral Given 11/22/17 1140)  ketorolac (TORADOL) 30 MG/ML injection 30 mg (30 mg  Intramuscular Given 11/22/17 1210)     Initial Impression / Assessment and Plan / ED Course  I have reviewed the triage vital signs and the nursing notes.  Pertinent labs & imaging results that were available during my care of the patient were reviewed by me and considered in my medical decision making (see chart for details).     Patient with back pain radiating down the leg.  Also in hip and ankle.  History of rheumatoid arthritis.  Negative Doppler.  Has some tachycardia but this appears to be chronic for the patient.  Has nonfocal exam.  Will give some more muscle relaxers for home.  Given shot of Toradol and muscle relaxer here along with the Percocet.  Follow-up with her primary care doctor.  Final Clinical Impressions(s) / ED Diagnoses   Final diagnoses:  Sciatica of right side    ED Discharge Orders        Ordered    cyclobenzaprine (FLEXERIL) 10 MG tablet  3 times daily PRN     11/22/17 1224       Davonna Belling, MD 11/22/17 1236

## 2017-11-22 NOTE — ED Notes (Signed)
Pt normally takes BP meds in the morning. Pt did not take her BP meds today. Pt advised to take BP meds when she gets home.

## 2017-11-22 NOTE — Progress Notes (Signed)
RLE venous duplex prelim: no obvious evidence of DVT. Landry Mellow, RDMS, RVT

## 2017-11-22 NOTE — ED Notes (Signed)
Pt c/o "spasms" in her sides. Pt states she gets these commonly when her pain is uncontrolled. MD made aware. No new orders at this time.

## 2017-11-22 NOTE — ED Triage Notes (Signed)
Per Pt, pT is coming from home with complaints of right hip pain that started last week. Hx of RA and saw her MD. Pt was given prednisone shot that normally helps, but pain has not decreased and the right left feels "tight."

## 2017-11-29 ENCOUNTER — Encounter: Payer: Managed Care, Other (non HMO) | Admitting: Orthotics

## 2017-12-02 ENCOUNTER — Ambulatory Visit (INDEPENDENT_AMBULATORY_CARE_PROVIDER_SITE_OTHER): Payer: Managed Care, Other (non HMO) | Admitting: *Deleted

## 2017-12-02 DIAGNOSIS — J309 Allergic rhinitis, unspecified: Secondary | ICD-10-CM | POA: Diagnosis not present

## 2017-12-03 ENCOUNTER — Telehealth: Payer: Self-pay | Admitting: Podiatry

## 2017-12-03 NOTE — Telephone Encounter (Signed)
Called and spoke to [place] and was told that Brittany Riggs the case handler's phone number is [phone]. Hung up after getting the correct number from [employer]. I called Brittany Riggs and told him I had not received any requests for the pt's medical records or they would already have them. He said it was sent a few times in October and November. He asked if I had a direct fax he could fax the request to and I told him to fax it to me at [phone]. Brittany Riggs stated he would get if faxed right over.
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
## 2017-12-09 ENCOUNTER — Ambulatory Visit (INDEPENDENT_AMBULATORY_CARE_PROVIDER_SITE_OTHER): Payer: Managed Care, Other (non HMO) | Admitting: *Deleted

## 2017-12-09 ENCOUNTER — Encounter: Payer: Managed Care, Other (non HMO) | Admitting: Orthotics

## 2017-12-09 DIAGNOSIS — J309 Allergic rhinitis, unspecified: Secondary | ICD-10-CM | POA: Diagnosis not present

## 2017-12-09 MED ORDER — EPINEPHRINE 0.3 MG/0.3ML IJ SOAJ: 0.3000 mg | Freq: Once | INTRAMUSCULAR | 0 refills | Status: AC

## 2017-12-15 ENCOUNTER — Ambulatory Visit: Payer: Self-pay

## 2017-12-16 ENCOUNTER — Ambulatory Visit (INDEPENDENT_AMBULATORY_CARE_PROVIDER_SITE_OTHER): Payer: Managed Care, Other (non HMO) | Admitting: *Deleted

## 2017-12-16 DIAGNOSIS — J309 Allergic rhinitis, unspecified: Secondary | ICD-10-CM | POA: Diagnosis not present

## 2017-12-30 ENCOUNTER — Ambulatory Visit (INDEPENDENT_AMBULATORY_CARE_PROVIDER_SITE_OTHER): Payer: Managed Care, Other (non HMO) | Admitting: *Deleted

## 2017-12-30 DIAGNOSIS — J309 Allergic rhinitis, unspecified: Secondary | ICD-10-CM | POA: Diagnosis not present

## 2018-01-04 ENCOUNTER — Ambulatory Visit (INDEPENDENT_AMBULATORY_CARE_PROVIDER_SITE_OTHER): Payer: Managed Care, Other (non HMO) | Admitting: *Deleted

## 2018-01-04 DIAGNOSIS — J309 Allergic rhinitis, unspecified: Secondary | ICD-10-CM | POA: Diagnosis not present

## 2018-01-13 ENCOUNTER — Ambulatory Visit (INDEPENDENT_AMBULATORY_CARE_PROVIDER_SITE_OTHER): Payer: Managed Care, Other (non HMO) | Admitting: *Deleted

## 2018-01-13 DIAGNOSIS — J309 Allergic rhinitis, unspecified: Secondary | ICD-10-CM

## 2018-01-18 DIAGNOSIS — J301 Allergic rhinitis due to pollen: Secondary | ICD-10-CM | POA: Diagnosis not present

## 2018-01-20 ENCOUNTER — Ambulatory Visit (INDEPENDENT_AMBULATORY_CARE_PROVIDER_SITE_OTHER): Payer: Managed Care, Other (non HMO) | Admitting: *Deleted

## 2018-01-20 DIAGNOSIS — J309 Allergic rhinitis, unspecified: Secondary | ICD-10-CM | POA: Diagnosis not present

## 2018-01-20 NOTE — Progress Notes (Signed)
VIALS EXP 01-21-19

## 2018-01-27 ENCOUNTER — Ambulatory Visit (INDEPENDENT_AMBULATORY_CARE_PROVIDER_SITE_OTHER): Payer: Managed Care, Other (non HMO) | Admitting: *Deleted

## 2018-01-27 DIAGNOSIS — J309 Allergic rhinitis, unspecified: Secondary | ICD-10-CM | POA: Diagnosis not present

## 2018-01-28 ENCOUNTER — Ambulatory Visit: Payer: Managed Care, Other (non HMO) | Admitting: Podiatry

## 2018-01-31 ENCOUNTER — Telehealth: Payer: Self-pay | Admitting: Podiatry

## 2018-01-31 NOTE — Telephone Encounter (Signed)
Called Elta Guadeloupe back and told him I had received the fax on 11 February. I informed him I'm the only person that does medical records for two of our offices and that I was out of the office on Friday when the pt came in. I told him I would get the request taken care of and faxed back to him tomorrow.

## 2018-01-31 NOTE — Telephone Encounter (Signed)
This is Elta Guadeloupe, I'm a case Psychologist, prison and probation services with [employer]. I'm calling to follow up on the fax I sent on 11 February for pt's medical records. You can reach me at [phone]. Thank you and have a nice day.

## 2018-02-01 ENCOUNTER — Ambulatory Visit (INDEPENDENT_AMBULATORY_CARE_PROVIDER_SITE_OTHER): Payer: Managed Care, Other (non HMO) | Admitting: *Deleted

## 2018-02-01 DIAGNOSIS — J309 Allergic rhinitis, unspecified: Secondary | ICD-10-CM | POA: Diagnosis not present

## 2018-02-03 ENCOUNTER — Ambulatory Visit: Payer: Managed Care, Other (non HMO) | Admitting: Podiatry

## 2018-02-10 ENCOUNTER — Ambulatory Visit (INDEPENDENT_AMBULATORY_CARE_PROVIDER_SITE_OTHER): Payer: Managed Care, Other (non HMO) | Admitting: *Deleted

## 2018-02-10 DIAGNOSIS — J309 Allergic rhinitis, unspecified: Secondary | ICD-10-CM | POA: Diagnosis not present

## 2018-02-14 ENCOUNTER — Ambulatory Visit (INDEPENDENT_AMBULATORY_CARE_PROVIDER_SITE_OTHER): Payer: Managed Care, Other (non HMO) | Admitting: *Deleted

## 2018-02-14 DIAGNOSIS — J309 Allergic rhinitis, unspecified: Secondary | ICD-10-CM | POA: Diagnosis not present

## 2018-02-14 MED ORDER — BECLOMETHASONE DIPROP HFA 80 MCG/ACT IN AERB: 2.0000 | INHALATION_SPRAY | Freq: Two times a day (BID) | RESPIRATORY_TRACT | 1 refills | Status: DC

## 2018-02-21 ENCOUNTER — Ambulatory Visit (INDEPENDENT_AMBULATORY_CARE_PROVIDER_SITE_OTHER): Payer: Managed Care, Other (non HMO) | Admitting: *Deleted

## 2018-02-21 DIAGNOSIS — J309 Allergic rhinitis, unspecified: Secondary | ICD-10-CM | POA: Diagnosis not present

## 2018-03-01 ENCOUNTER — Ambulatory Visit (INDEPENDENT_AMBULATORY_CARE_PROVIDER_SITE_OTHER): Payer: Managed Care, Other (non HMO) | Admitting: *Deleted

## 2018-03-01 DIAGNOSIS — J309 Allergic rhinitis, unspecified: Secondary | ICD-10-CM

## 2018-03-10 ENCOUNTER — Ambulatory Visit (INDEPENDENT_AMBULATORY_CARE_PROVIDER_SITE_OTHER): Payer: Managed Care, Other (non HMO) | Admitting: *Deleted

## 2018-03-10 DIAGNOSIS — J309 Allergic rhinitis, unspecified: Secondary | ICD-10-CM

## 2018-03-15 ENCOUNTER — Ambulatory Visit (INDEPENDENT_AMBULATORY_CARE_PROVIDER_SITE_OTHER): Payer: Managed Care, Other (non HMO) | Admitting: *Deleted

## 2018-03-15 DIAGNOSIS — J309 Allergic rhinitis, unspecified: Secondary | ICD-10-CM | POA: Diagnosis not present

## 2018-03-24 ENCOUNTER — Ambulatory Visit (INDEPENDENT_AMBULATORY_CARE_PROVIDER_SITE_OTHER): Payer: 59 | Admitting: *Deleted

## 2018-03-24 ENCOUNTER — Ambulatory Visit: Payer: 59 | Admitting: Podiatry

## 2018-03-24 ENCOUNTER — Encounter: Payer: Self-pay | Admitting: Podiatry

## 2018-03-24 DIAGNOSIS — M722 Plantar fascial fibromatosis: Secondary | ICD-10-CM

## 2018-03-24 DIAGNOSIS — J309 Allergic rhinitis, unspecified: Secondary | ICD-10-CM | POA: Diagnosis not present

## 2018-03-24 DIAGNOSIS — M779 Enthesopathy, unspecified: Secondary | ICD-10-CM | POA: Diagnosis not present

## 2018-03-24 MED ORDER — TRIAMCINOLONE ACETONIDE 10 MG/ML IJ SUSP
10.0000 mg | Freq: Once | INTRAMUSCULAR | Status: AC
Start: 2018-03-24 — End: 2018-03-24
  Administered 2018-03-24: 10 mg

## 2018-03-24 MED ORDER — TRIAMCINOLONE ACETONIDE 10 MG/ML IJ SUSP
10.0000 mg | Freq: Once | INTRAMUSCULAR | Status: AC
  Administered 2018-03-24: 10 mg

## 2018-03-25 NOTE — Progress Notes (Signed)
Subjective:   Patient ID: Brittany Riggs, female   DOB: 47 y.o.   MRN: [id number]   HPI Patient states overall doing pretty well but having some pain still in the outside of the left heel and then in the sinus tarsi has become inflamed again.  Patient is trying to be more active but does have extreme obesity   ROS      Objective:  Physical Exam  Neurovascular status intact with inflammation pain of the sinus tarsi left and pain in the lateral plantar heel with the medial and central bands doing well after endoscopic release     Assessment:  Plantar fasciitis left along with sinus tarsitis left     Plan:  Injected the plantar lateral heel 3 mg Kenalog 5 mg Xylocaine in the sinus tarsi left 3 mg Kenalog 5 million Xylocaine and advised on reduced activity levels.  Reappoint as needed

## 2018-03-31 ENCOUNTER — Telehealth: Payer: Self-pay

## 2018-03-31 ENCOUNTER — Ambulatory Visit: Payer: 59 | Admitting: Internal Medicine

## 2018-03-31 ENCOUNTER — Encounter: Payer: Self-pay | Admitting: Internal Medicine

## 2018-03-31 VITALS — BP 138/82 | HR 107 | Ht 70.0 in | Wt >= 6400 oz

## 2018-03-31 DIAGNOSIS — R739 Hyperglycemia, unspecified: Secondary | ICD-10-CM | POA: Diagnosis not present

## 2018-03-31 DIAGNOSIS — E041 Nontoxic single thyroid nodule: Secondary | ICD-10-CM

## 2018-03-31 DIAGNOSIS — E063 Autoimmune thyroiditis: Secondary | ICD-10-CM | POA: Diagnosis not present

## 2018-03-31 LAB — T4, FREE: Free T4: 0.82 ng/dL (ref 0.60–1.60)

## 2018-03-31 LAB — TSH: TSH: 0.79 u[IU]/mL (ref 0.35–4.50)

## 2018-03-31 LAB — HEMOGLOBIN A1C: Hgb A1c MFr Bld: 6.1 % (ref 4.6–6.5)

## 2018-03-31 LAB — T3, FREE: T3, Free: 4.6 pg/mL — ABNORMAL HIGH (ref 2.3–4.2)

## 2018-03-31 NOTE — Telephone Encounter (Signed)
Spoke to patient. Gave results and lab instructions. Patient verbalized understanding.
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
## 2018-03-31 NOTE — Telephone Encounter (Signed)
-----   Message from Philemon Kingdom, MD sent at 03/31/2018 11:17 AM EDT ----- Larey Seat, can you please call pt:  TFTs are normal, with again, a slightly high free T3. No intervention needed for now. HbA1c is in the prediabetic range. We did discuss dietary changes at the time of the visit, which she has already started to do. I would advise to have this checked by PCP in ~ 6 mo from now if possible (we can also do this here if she prefers), OTW, will check this when she returns in 1 year.

## 2018-03-31 NOTE — Patient Instructions (Signed)
Please stop at the lab.  We will order a new thyroid U/S today.  Please come back for a follow-up appointment in 1 year.

## 2018-04-07 ENCOUNTER — Ambulatory Visit (INDEPENDENT_AMBULATORY_CARE_PROVIDER_SITE_OTHER): Payer: 59 | Admitting: *Deleted

## 2018-04-07 DIAGNOSIS — J309 Allergic rhinitis, unspecified: Secondary | ICD-10-CM

## 2018-04-13 ENCOUNTER — Encounter: Payer: Self-pay | Admitting: *Deleted

## 2018-04-13 NOTE — Progress Notes (Signed)
Maintenance vial made. Exp: 04-16-19. hv 

## 2018-04-15 DIAGNOSIS — J301 Allergic rhinitis due to pollen: Secondary | ICD-10-CM | POA: Diagnosis not present

## 2018-04-19 ENCOUNTER — Ambulatory Visit: Payer: Self-pay | Admitting: Internal Medicine

## 2018-04-19 ENCOUNTER — Ambulatory Visit (INDEPENDENT_AMBULATORY_CARE_PROVIDER_SITE_OTHER): Payer: 59 | Admitting: *Deleted

## 2018-04-19 DIAGNOSIS — J309 Allergic rhinitis, unspecified: Secondary | ICD-10-CM

## 2018-05-05 ENCOUNTER — Ambulatory Visit (INDEPENDENT_AMBULATORY_CARE_PROVIDER_SITE_OTHER): Payer: 59

## 2018-05-05 DIAGNOSIS — J309 Allergic rhinitis, unspecified: Secondary | ICD-10-CM | POA: Diagnosis not present

## 2018-05-17 ENCOUNTER — Ambulatory Visit (INDEPENDENT_AMBULATORY_CARE_PROVIDER_SITE_OTHER): Payer: 59 | Admitting: *Deleted

## 2018-05-17 DIAGNOSIS — J309 Allergic rhinitis, unspecified: Secondary | ICD-10-CM | POA: Diagnosis not present

## 2018-05-25 ENCOUNTER — Ambulatory Visit: Payer: 59 | Admitting: Podiatry

## 2018-05-26 ENCOUNTER — Ambulatory Visit (INDEPENDENT_AMBULATORY_CARE_PROVIDER_SITE_OTHER): Payer: 59 | Admitting: *Deleted

## 2018-05-26 DIAGNOSIS — J309 Allergic rhinitis, unspecified: Secondary | ICD-10-CM | POA: Diagnosis not present

## 2018-05-30 ENCOUNTER — Other Ambulatory Visit: Payer: Self-pay | Admitting: Podiatry

## 2018-05-30 ENCOUNTER — Ambulatory Visit (INDEPENDENT_AMBULATORY_CARE_PROVIDER_SITE_OTHER): Payer: 59

## 2018-05-30 ENCOUNTER — Ambulatory Visit: Payer: 59 | Admitting: Podiatry

## 2018-05-30 ENCOUNTER — Encounter: Payer: Self-pay | Admitting: Podiatry

## 2018-05-30 DIAGNOSIS — M79671 Pain in right foot: Secondary | ICD-10-CM

## 2018-05-30 DIAGNOSIS — M7752 Other enthesopathy of left foot: Secondary | ICD-10-CM

## 2018-05-30 DIAGNOSIS — M722 Plantar fascial fibromatosis: Secondary | ICD-10-CM

## 2018-05-30 DIAGNOSIS — M779 Enthesopathy, unspecified: Secondary | ICD-10-CM

## 2018-05-30 MED ORDER — TRIAMCINOLONE ACETONIDE 10 MG/ML IJ SUSP
10.0000 mg | Freq: Once | INTRAMUSCULAR | Status: AC
Start: 2018-05-30 — End: 2018-05-30
  Administered 2018-05-30: 10 mg

## 2018-05-30 NOTE — Patient Instructions (Signed)

## 2018-06-01 NOTE — Progress Notes (Signed)
Subjective:   Patient ID: Brittany Riggs, female   DOB: 47 y.o.   MRN: [id number]   HPI Patient presents stating her right heel has started to hurt and she is still having some trouble with the left ankle at this time   ROS      Objective:  Physical Exam  Neurovascular status intact with patient's right heel being quite inflamed in the medial band in the left sinus tarsi still sore     Assessment:  Plantar fasciitis right with inflammation and sinus tarsitis capsulitis left with obesity is complicating factor     Plan:  I injected the plantar fascia right 3 mg Kenalog 5 mg Xylocaine in the sinus tarsi left 3 mg Kenalog 5 mg Xylocaine and advised on continued conservative care and hopeful weight loss

## 2018-06-02 ENCOUNTER — Ambulatory Visit (INDEPENDENT_AMBULATORY_CARE_PROVIDER_SITE_OTHER): Payer: 59 | Admitting: *Deleted

## 2018-06-02 DIAGNOSIS — J309 Allergic rhinitis, unspecified: Secondary | ICD-10-CM

## 2018-06-09 ENCOUNTER — Ambulatory Visit (INDEPENDENT_AMBULATORY_CARE_PROVIDER_SITE_OTHER): Payer: 59

## 2018-06-09 DIAGNOSIS — J309 Allergic rhinitis, unspecified: Secondary | ICD-10-CM | POA: Diagnosis not present

## 2018-06-10 ENCOUNTER — Telehealth: Payer: Self-pay | Admitting: Podiatry

## 2018-06-10 NOTE — Telephone Encounter (Signed)
Left message informing pt she may have a steroid flare of symptoms to rest, ice 3-4 times daily for 15-20 minutes/session and call with concerns.

## 2018-06-10 NOTE — Telephone Encounter (Signed)
Pt said shes having a lot of pain after injection.

## 2018-06-14 ENCOUNTER — Ambulatory Visit (INDEPENDENT_AMBULATORY_CARE_PROVIDER_SITE_OTHER): Payer: 59 | Admitting: *Deleted

## 2018-06-14 DIAGNOSIS — J309 Allergic rhinitis, unspecified: Secondary | ICD-10-CM | POA: Diagnosis not present

## 2018-06-21 ENCOUNTER — Ambulatory Visit (INDEPENDENT_AMBULATORY_CARE_PROVIDER_SITE_OTHER): Payer: 59 | Admitting: *Deleted

## 2018-06-21 DIAGNOSIS — J309 Allergic rhinitis, unspecified: Secondary | ICD-10-CM

## 2018-06-23 ENCOUNTER — Ambulatory Visit: Payer: 59 | Admitting: Podiatry

## 2018-07-07 ENCOUNTER — Telehealth: Payer: Self-pay | Admitting: *Deleted

## 2018-07-07 ENCOUNTER — Ambulatory Visit (INDEPENDENT_AMBULATORY_CARE_PROVIDER_SITE_OTHER): Payer: 59 | Admitting: *Deleted

## 2018-07-07 DIAGNOSIS — J309 Allergic rhinitis, unspecified: Secondary | ICD-10-CM

## 2018-07-07 NOTE — Telephone Encounter (Signed)
Patient would like an eye drop for itchy eyes. Please advise.

## 2018-07-11 ENCOUNTER — Ambulatory Visit: Payer: 59 | Admitting: Podiatry

## 2018-07-11 ENCOUNTER — Other Ambulatory Visit: Payer: Self-pay

## 2018-07-11 ENCOUNTER — Other Ambulatory Visit: Payer: 59 | Admitting: Orthotics

## 2018-07-11 MED ORDER — OLOPATADINE HCL 0.7 % OP SOLN: 1.0000 [drp] | Freq: Every day | OPHTHALMIC | 5 refills | Status: DC | PRN

## 2018-07-11 NOTE — Telephone Encounter (Signed)
Give her Pazeo or Pataday or Patanol, depending on what her insurance covers best.

## 2018-07-11 NOTE — Telephone Encounter (Signed)
Spoke with pt to find out what pharmacy to send rx too. I sent in pazeo with [facility] at [address]

## 2018-07-26 ENCOUNTER — Ambulatory Visit (INDEPENDENT_AMBULATORY_CARE_PROVIDER_SITE_OTHER): Payer: 59

## 2018-07-26 DIAGNOSIS — J309 Allergic rhinitis, unspecified: Secondary | ICD-10-CM

## 2018-08-03 ENCOUNTER — Ambulatory Visit: Payer: 59 | Admitting: Podiatry

## 2018-08-03 ENCOUNTER — Other Ambulatory Visit: Payer: 59 | Admitting: Orthotics

## 2018-08-03 ENCOUNTER — Encounter: Payer: 59 | Admitting: Podiatry

## 2018-08-09 ENCOUNTER — Ambulatory Visit (INDEPENDENT_AMBULATORY_CARE_PROVIDER_SITE_OTHER): Payer: 59 | Admitting: *Deleted

## 2018-08-09 DIAGNOSIS — J309 Allergic rhinitis, unspecified: Secondary | ICD-10-CM

## 2018-08-10 ENCOUNTER — Other Ambulatory Visit: Payer: 59 | Admitting: Orthotics

## 2018-08-10 ENCOUNTER — Ambulatory Visit: Payer: 59 | Admitting: Podiatry

## 2018-08-10 NOTE — Progress Notes (Signed)
VIALS EXP 08-11-19 

## 2018-08-12 ENCOUNTER — Encounter: Payer: Self-pay | Admitting: Podiatry

## 2018-08-12 ENCOUNTER — Ambulatory Visit: Payer: 59 | Admitting: Podiatry

## 2018-08-12 DIAGNOSIS — M722 Plantar fascial fibromatosis: Secondary | ICD-10-CM | POA: Diagnosis not present

## 2018-08-12 DIAGNOSIS — M779 Enthesopathy, unspecified: Secondary | ICD-10-CM

## 2018-08-12 MED ORDER — TRIAMCINOLONE ACETONIDE 10 MG/ML IJ SUSP
10.0000 mg | Freq: Once | INTRAMUSCULAR | Status: AC
  Administered 2018-08-12: 10 mg

## 2018-08-12 NOTE — Progress Notes (Signed)
Subjective:   Patient ID: Brittany Riggs, female   DOB: 47 y.o.   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

## 2018-08-16 ENCOUNTER — Ambulatory Visit (INDEPENDENT_AMBULATORY_CARE_PROVIDER_SITE_OTHER): Payer: 59

## 2018-08-16 DIAGNOSIS — J309 Allergic rhinitis, unspecified: Secondary | ICD-10-CM | POA: Diagnosis not present

## 2018-08-17 DIAGNOSIS — J301 Allergic rhinitis due to pollen: Secondary | ICD-10-CM | POA: Diagnosis not present

## 2018-09-05 ENCOUNTER — Other Ambulatory Visit: Payer: Self-pay | Admitting: Gastroenterology

## 2018-09-09 ENCOUNTER — Ambulatory Visit (INDEPENDENT_AMBULATORY_CARE_PROVIDER_SITE_OTHER): Payer: 59

## 2018-09-09 DIAGNOSIS — J309 Allergic rhinitis, unspecified: Secondary | ICD-10-CM | POA: Diagnosis not present

## 2018-09-12 ENCOUNTER — Telehealth: Payer: Self-pay | Admitting: Podiatry

## 2018-09-12 NOTE — Telephone Encounter (Signed)
I saw Dr. Paulla Dolly on 30 August and he gave me an injection in my right heel. I'm still having some issues with my foot. Its very tender and sore. I've been putting ice on it and it still seems to be very tender and sore. I was calling to see if there is anything else I can do. I did go ahead and make an appointment to see him, and that was scheduled for  14 October at 4:30 pm. I was just wondering what I could do in the mean time. My phone number is [phone]. Thank you. Bye bye.

## 2018-09-12 NOTE — Telephone Encounter (Signed)
Left message for pt to rest, ice and elevate, wear orthotics or cam boot until seen, and if able to take OTC antiinflammatories may take as package directs, and I would put her on the cancellation schedule.

## 2018-09-23 ENCOUNTER — Other Ambulatory Visit: Payer: Self-pay

## 2018-09-23 ENCOUNTER — Encounter (HOSPITAL_COMMUNITY): Payer: Self-pay | Admitting: *Deleted

## 2018-09-26 ENCOUNTER — Encounter: Payer: Self-pay | Admitting: Podiatry

## 2018-09-26 ENCOUNTER — Ambulatory Visit: Payer: 59 | Admitting: Podiatry

## 2018-09-26 DIAGNOSIS — M722 Plantar fascial fibromatosis: Secondary | ICD-10-CM

## 2018-09-26 DIAGNOSIS — M779 Enthesopathy, unspecified: Secondary | ICD-10-CM

## 2018-09-26 MED ORDER — TRIAMCINOLONE ACETONIDE 10 MG/ML IJ SUSP
10.0000 mg | Freq: Once | INTRAMUSCULAR | Status: AC
  Administered 2018-09-26: 10 mg

## 2018-09-27 ENCOUNTER — Other Ambulatory Visit: Payer: Self-pay | Admitting: Family Medicine

## 2018-09-27 DIAGNOSIS — Z1231 Encounter for screening mammogram for malignant neoplasm of breast: Secondary | ICD-10-CM

## 2018-09-28 ENCOUNTER — Encounter (HOSPITAL_COMMUNITY): Payer: Self-pay | Admitting: Emergency Medicine

## 2018-09-28 ENCOUNTER — Ambulatory Visit (HOSPITAL_COMMUNITY): Payer: 59 | Admitting: Anesthesiology

## 2018-09-28 ENCOUNTER — Ambulatory Visit (INDEPENDENT_AMBULATORY_CARE_PROVIDER_SITE_OTHER): Payer: 59 | Admitting: *Deleted

## 2018-09-28 ENCOUNTER — Other Ambulatory Visit: Payer: Self-pay

## 2018-09-28 ENCOUNTER — Encounter (HOSPITAL_COMMUNITY)
Admission: RE | Disposition: A | Discharge status: Home / Self Care | Payer: Self-pay | Source: Ambulatory Visit | Attending: Gastroenterology

## 2018-09-28 ENCOUNTER — Ambulatory Visit (HOSPITAL_COMMUNITY)
Admission: RE | Admit: 2018-09-28 | Discharge: 2018-09-28 | Disposition: A | Discharge status: Home / Self Care | Payer: 59 | Source: Ambulatory Visit | Attending: Gastroenterology | Admitting: Gastroenterology

## 2018-09-28 DIAGNOSIS — Z6841 Body Mass Index (BMI) 40.0 and over, adult: Secondary | ICD-10-CM | POA: Diagnosis not present

## 2018-09-28 DIAGNOSIS — I10 Essential (primary) hypertension: Secondary | ICD-10-CM | POA: Insufficient documentation

## 2018-09-28 DIAGNOSIS — Z86010 Personal history of colonic polyps: Secondary | ICD-10-CM | POA: Diagnosis not present

## 2018-09-28 DIAGNOSIS — Z1211 Encounter for screening for malignant neoplasm of colon: Secondary | ICD-10-CM | POA: Diagnosis not present

## 2018-09-28 DIAGNOSIS — Z882 Allergy status to sulfonamides status: Secondary | ICD-10-CM | POA: Insufficient documentation

## 2018-09-28 DIAGNOSIS — Z880 Allergy status to penicillin: Secondary | ICD-10-CM | POA: Insufficient documentation

## 2018-09-28 DIAGNOSIS — M069 Rheumatoid arthritis, unspecified: Secondary | ICD-10-CM | POA: Insufficient documentation

## 2018-09-28 DIAGNOSIS — Z885 Allergy status to narcotic agent status: Secondary | ICD-10-CM | POA: Diagnosis not present

## 2018-09-28 DIAGNOSIS — D123 Benign neoplasm of transverse colon: Secondary | ICD-10-CM | POA: Diagnosis not present

## 2018-09-28 DIAGNOSIS — J309 Allergic rhinitis, unspecified: Secondary | ICD-10-CM

## 2018-09-28 DIAGNOSIS — Z79899 Other long term (current) drug therapy: Secondary | ICD-10-CM | POA: Insufficient documentation

## 2018-09-28 DIAGNOSIS — Z888 Allergy status to other drugs, medicaments and biological substances status: Secondary | ICD-10-CM | POA: Insufficient documentation

## 2018-09-28 DIAGNOSIS — Z800 Family history of malignant neoplasm of digestive organs: Secondary | ICD-10-CM | POA: Diagnosis not present

## 2018-09-28 DIAGNOSIS — J45909 Unspecified asthma, uncomplicated: Secondary | ICD-10-CM | POA: Insufficient documentation

## 2018-09-28 HISTORY — PX: POLYPECTOMY: SHX5525

## 2018-09-28 HISTORY — PX: COLONOSCOPY WITH PROPOFOL: SHX5780

## 2018-09-28 SURGERY — COLONOSCOPY WITH PROPOFOL
Anesthesia: Monitor Anesthesia Care

## 2018-09-28 MED ORDER — PROPOFOL 10 MG/ML IV BOLUS
INTRAVENOUS | Status: AC
  Filled 2018-09-28: qty 40

## 2018-09-28 MED ORDER — LACTATED RINGERS IV SOLN
INTRAVENOUS | Status: DC
  Administered 2018-09-28: 09:00:00 via INTRAVENOUS

## 2018-09-28 MED ORDER — PROPOFOL 500 MG/50ML IV EMUL
INTRAVENOUS | Status: DC | PRN
  Administered 2018-09-28: 125 ug/kg/min via INTRAVENOUS

## 2018-09-28 MED ORDER — PROPOFOL 10 MG/ML IV BOLUS
INTRAVENOUS | Status: AC
  Filled 2018-09-28: qty 20

## 2018-09-28 MED ORDER — LIDOCAINE 2% (20 MG/ML) 5 ML SYRINGE
INTRAMUSCULAR | Status: DC | PRN
  Administered 2018-09-28: 100 mg via INTRAVENOUS

## 2018-09-28 MED ORDER — PROPOFOL 10 MG/ML IV BOLUS
INTRAVENOUS | Status: DC | PRN
  Administered 2018-09-28: 20 mg via INTRAVENOUS

## 2018-09-28 MED ORDER — SODIUM CHLORIDE 0.9 % IV SOLN: INTRAVENOUS | Status: DC

## 2018-09-28 SURGICAL SUPPLY — 22 items

## 2018-09-28 NOTE — Op Note (Signed)
[facility] Patient Name: Brittany Riggs Procedure Date: 09/28/2018 MRN: [id number] Attending MD: Arta Silence , MD Date of Birth: June 05, 1971 CSN: [id number] Age: 47 Admit Type: Outpatient Procedure:                Colonoscopy Indications:              High risk colon cancer surveillance: Personal                            history of colonic polyps, Last colonoscopy: 2014,                            Family history of colon cancer in a first-degree                            relative Providers:                Arta Silence, MD, Carmie End, RN, Cherylynn Ridges, Technician Referring MD:              Medicines:                Monitored Anesthesia Care Complications:            No immediate complications. Estimated Blood Loss:     Estimated blood loss: none. Procedure:                Pre-Anesthesia Assessment:                           - Prior to the procedure, a History and Physical                            was performed, and patient medications and                            allergies were reviewed. The patient's tolerance of                            previous anesthesia was also reviewed. The risks                            and benefits of the procedure and the sedation                            options and risks were discussed with the patient.                            All questions were answered, and informed consent                            was obtained. Prior Anticoagulants: The patient has                            taken no previous anticoagulant or  antiplatelet                            agents. ASA Grade Assessment: IV - A patient with                            severe systemic disease that is a constant threat                            to life. After reviewing the risks and benefits,                            the patient was deemed in satisfactory condition to                            undergo the procedure.                       After obtaining informed consent, the colonoscope                            was passed under direct vision. Throughout the                            procedure, the patient's blood pressure, pulse, and                            oxygen saturations were monitored continuously. The                            CF-HQ190L ([id number]) Olympus adult colonoscope was                            introduced through the anus and advanced to the the                            cecum, identified by appendiceal orifice and                            ileocecal valve. The ileocecal valve, appendiceal                            orifice, and rectum were photographed. The entire                            colon was examined. The colonoscopy was performed                            without difficulty. The patient tolerated the                            procedure well. The quality of the bowel                            preparation was  good. Scope In: 10:09:21 AM Scope Out: 10:21:10 AM Scope Withdrawal Time: 0 hours 8 minutes 4 seconds  Total Procedure Duration: 0 hours 11 minutes 49 seconds  Findings:      The perianal and digital rectal examinations were normal.      A 4 mm polyp was found in the transverse colon. The polyp was sessile.       The polyp was removed with a cold biopsy forceps. Resection and       retrieval were complete.      Colon otherwise normal; no other polyps, masses, vascular ectasias, or       inflammatory changes were seen.      The retroflexed view of the distal rectum and anal verge was normal and       showed no anal or rectal abnormalities. Impression:               - One 4 mm polyp in the transverse colon, removed                            with a cold biopsy forceps. Resected and retrieved.                           - The distal rectum and anal verge are normal on                            retroflexion view.                           - The examination was  otherwise normal. Moderate Sedation:      N/A- Per Anesthesia Care Recommendation:           - Patient has a contact number available for                            emergencies. The signs and symptoms of potential                            delayed complications were discussed with the                            patient. Return to normal activities tomorrow.                            Written discharge instructions were provided to the                            patient.                           - Discharge patient to home (ambulatory).                           - Resume previous diet today.                           - Continue present medications.                           -  Await pathology results.                           - Repeat colonoscopy in 5 years for surveillance                            based on pathology results.                           - Return to GI clinic PRN.                           - Return to referring physician as previously                            scheduled. Procedure Code(s):        --- Professional ---                           [phone], Colonoscopy, flexible; with biopsy, single                            or multiple Diagnosis Code(s):        --- Professional ---                           Z86.010, Personal history of colonic polyps                           D12.3, Benign neoplasm of transverse colon (hepatic                            flexure or splenic flexure)                           Z80.0, Family history of malignant neoplasm of                            digestive organs CPT copyright 2018 [employer] Association. All rights reserved. The codes documented in this report are preliminary and upon coder review may  be revised to meet current compliance requirements. Arta Silence, MD 09/28/2018 10:30:10 AM This report has been signed electronically. Number of Addenda: 0

## 2018-09-28 NOTE — Anesthesia Preprocedure Evaluation (Signed)
Anesthesia Evaluation  Patient identified by MRN, date of birth, ID band Patient awake    Reviewed: Allergy & Precautions, NPO status , Patient's Chart, lab work & pertinent test results  Airway Mallampati: II  TM Distance: >3 FB Neck ROM: Full    Dental no notable dental hx.    Pulmonary asthma ,    breath sounds clear to auscultation + decreased breath sounds      Cardiovascular hypertension, Normal cardiovascular exam Rhythm:Regular Rate:Normal     Neuro/Psych negative neurological ROS  negative psych ROS   GI/Hepatic negative GI ROS, Neg liver ROS,   Endo/Other  Morbid obesity  Renal/GU negative Renal ROS  negative genitourinary   Musculoskeletal  (+) Arthritis , Rheumatoid disorders,    Abdominal (+) + obese,   Peds negative pediatric ROS (+)  Hematology negative hematology ROS (+)   Anesthesia Other Findings   Reproductive/Obstetrics negative OB ROS                             Anesthesia Physical Anesthesia Plan  ASA: IV  Anesthesia Plan: MAC   Post-op Pain Management:    Induction: Intravenous  PONV Risk Score and Plan:   Airway Management Planned: Simple Face Mask  Additional Equipment:   Intra-op Plan:   Post-operative Plan:   Informed Consent: I have reviewed the patients History and Physical, chart, labs and discussed the procedure including the risks, benefits and alternatives for the proposed anesthesia with the patient or authorized representative who has indicated his/her understanding and acceptance.   Dental advisory given  Plan Discussed with: CRNA and Surgeon  Anesthesia Plan Comments:         Anesthesia Quick Evaluation

## 2018-09-28 NOTE — Transfer of Care (Signed)
Immediate Anesthesia Transfer of Care Note  Patient: Brittany Riggs  Procedure(s) Performed: COLONOSCOPY WITH PROPOFOL (N/A ) POLYPECTOMY  Patient Location: PACU  Anesthesia Type:MAC  Level of Consciousness: awake, alert  and oriented  Airway & Oxygen Therapy: Patient Spontanous Breathing and Patient connected to face mask oxygen  Post-op Assessment: Report given to RN and Post -op Vital signs reviewed and stable  Post vital signs: Reviewed and stable  Last Vitals:  Vitals Value Taken Time  BP    Temp    Pulse    Resp 14 09/28/2018 10:27 AM  SpO2    Vitals shown include unvalidated device data.  Last Pain:  Vitals:   09/28/18 0848  TempSrc: Oral  PainSc: 0-No pain         Complications: No apparent anesthesia complications

## 2018-09-28 NOTE — Discharge Instructions (Signed)

## 2018-09-28 NOTE — H&P (Signed)
Patient interval history reviewed.  Patient examined again.  There has been no change from documented H/P dated (scanned into chart from our office) except as documented above.  Assessment:  1.  Personal history colonic polyps. 2.  Family history colon cancer.  Plan:  1.  Colonoscopy. 2.  Risks (bleeding, infection, bowel perforation that could require surgery, sedation-related changes in cardiopulmonary systems), benefits (identification and possible treatment of source of symptoms, exclusion of certain causes of symptoms), and alternatives (watchful waiting, radiographic imaging studies, empiric medical treatment) of colonoscopy were explained to patient/family in detail and patient wishes to proceed.

## 2018-09-28 NOTE — Progress Notes (Signed)
Subjective:   Patient ID: Brittany Riggs, female   DOB: 47 y.o.   MRN: [id number]   HPI Patient presents stating the heel is hurting more now on the outside on the bottom of the right and right ankle has started to bother me again on the left   ROS      Objective:  Physical Exam  Neurovascular status intact with discomfort on the plantar lateral aspect of the right heel with inflammation fluid buildup and discomfort in the sinus tarsi left with inflammation     Assessment:  Acute plantar fasciitis that is more compensation on the right and capsulitis left     Plan:  H&P condition reviewed and careful injection of the plantar lateral fascia was a dental finding and done after sterile prep of the area right and sterile prep and then capsule injection left sinus tarsi both which were tolerated well.  Reappoint for patient to recheck again as symptoms indicate

## 2018-09-28 NOTE — Anesthesia Postprocedure Evaluation (Signed)
Anesthesia Post Note  Patient: Brittany Riggs  Procedure(s) Performed: COLONOSCOPY WITH PROPOFOL (N/A ) POLYPECTOMY     Patient location during evaluation: PACU Anesthesia Type: MAC Level of consciousness: awake and alert Pain management: pain level controlled Vital Signs Assessment: post-procedure vital signs reviewed and stable Respiratory status: spontaneous breathing, nonlabored ventilation, respiratory function stable and patient connected to nasal cannula oxygen Cardiovascular status: stable and blood pressure returned to baseline Postop Assessment: no apparent nausea or vomiting Anesthetic complications: no    Last Vitals:  Vitals:   09/28/18 1030 09/28/18 1035  BP: 115/70   Pulse: 94   Resp: (!) 21   Temp:    SpO2: 100% 99%    Last Pain:  Vitals:   09/28/18 1027  TempSrc: Oral  PainSc:                  Chelsie Burel S

## 2018-09-29 ENCOUNTER — Encounter (HOSPITAL_COMMUNITY): Payer: Self-pay | Admitting: Gastroenterology

## 2018-10-06 ENCOUNTER — Ambulatory Visit (INDEPENDENT_AMBULATORY_CARE_PROVIDER_SITE_OTHER): Payer: 59

## 2018-10-06 DIAGNOSIS — J309 Allergic rhinitis, unspecified: Secondary | ICD-10-CM

## 2018-10-11 ENCOUNTER — Ambulatory Visit (INDEPENDENT_AMBULATORY_CARE_PROVIDER_SITE_OTHER): Payer: 59

## 2018-10-11 DIAGNOSIS — J309 Allergic rhinitis, unspecified: Secondary | ICD-10-CM

## 2018-10-15 ENCOUNTER — Emergency Department (HOSPITAL_COMMUNITY)
Admission: EM | Admit: 2018-10-15 | Discharge: 2018-10-15 | Disposition: A | Discharge status: Home / Self Care | Payer: 59 | Attending: Emergency Medicine | Admitting: Emergency Medicine

## 2018-10-15 ENCOUNTER — Encounter (HOSPITAL_COMMUNITY): Payer: Self-pay | Admitting: Emergency Medicine

## 2018-10-15 ENCOUNTER — Other Ambulatory Visit: Payer: Self-pay

## 2018-10-15 DIAGNOSIS — I10 Essential (primary) hypertension: Secondary | ICD-10-CM | POA: Diagnosis not present

## 2018-10-15 DIAGNOSIS — Z79899 Other long term (current) drug therapy: Secondary | ICD-10-CM | POA: Insufficient documentation

## 2018-10-15 DIAGNOSIS — J45909 Unspecified asthma, uncomplicated: Secondary | ICD-10-CM | POA: Insufficient documentation

## 2018-10-15 DIAGNOSIS — H5711 Ocular pain, right eye: Secondary | ICD-10-CM | POA: Insufficient documentation

## 2018-10-15 MED ORDER — POLYMYXIN B-TRIMETHOPRIM 10000-0.1 UNIT/ML-% OP SOLN: 1.0000 [drp] | Freq: Four times a day (QID) | OPHTHALMIC | Status: DC

## 2018-10-15 MED ORDER — POLYMYXIN B-TRIMETHOPRIM 10000-0.1 UNIT/ML-% OP SOLN
1.0000 [drp] | Freq: Four times a day (QID) | OPHTHALMIC | Status: DC
  Administered 2018-10-15: 1 [drp] via OPHTHALMIC
  Filled 2018-10-15: qty 10

## 2018-10-15 MED ORDER — FLUORESCEIN SODIUM 1 MG OP STRP
1.0000 | ORAL_STRIP | Freq: Once | OPHTHALMIC | Status: AC
  Administered 2018-10-15: 1 via OPHTHALMIC
  Filled 2018-10-15: qty 1

## 2018-10-15 MED ORDER — TETRACAINE HCL 0.5 % OP SOLN
2.0000 [drp] | Freq: Once | OPHTHALMIC | Status: AC
  Administered 2018-10-15: 2 [drp] via OPHTHALMIC
  Filled 2018-10-15: qty 4

## 2018-10-15 NOTE — ED Triage Notes (Signed)
Reports having allergies and is taking meds twice a day.  C/o pain in right eye with redness and tearing.

## 2018-10-15 NOTE — Discharge Instructions (Signed)
Use Polytrim drops 4 times a day. Continue using a dry eyedrops. You are having severe pain, you may use the numbing medicine up to 3 times a day. Try not to touch, itch, or irritate your eye. If you do touch your eye, wash your hands immediately with soap and water. Use a cool compress to help with pain and irritation. Follow-up with your eye doctor or the ophthalmologist listed below on Monday if your symptoms are not improving.   Return to the emergency room if you develop vision loss (blackness), ability to move your eye in one direction or the other, or any new, worsening, or concerning symptoms.

## 2018-10-15 NOTE — ED Notes (Addendum)
Pt unable to see out of right eye only for visual acuity

## 2018-10-15 NOTE — ED Provider Notes (Signed)
[facility] EMERGENCY DEPARTMENT Provider Note   CSN: [id number] Arrival date & time: 10/15/18  2014     History   Chief Complaint Chief Complaint  Patient presents with  . Eye Pain    HPI Brittany Riggs is a 47 y.o. female presenting for evaluation of right pain itching, pain, blurriness.  Patient states that last night, her right eye became very itchy.  She was scratching at it without relief.  This morning when she woke up, she had acute onset right eye pain.  Patient states that since then, her eye has been very watery, causing it to blur.  She has pain, worse with light and opening her eye.  She tried her eyedrops, this did not improve her symptoms.  Patient does have contacts, but has not worn them for several weeks.  She never sleeps with her main.  She denies symptoms of the left eye.  She denies fevers, chills, headache, nasal congestion, or ear pain.  She denies trauma or injury to the eye.  She denies known foreign body getting into her eye.  Pt with a h/o of severe allergies.  HPI  Past Medical History:  Diagnosis Date  . Allergy   . Anemia   . Asthma   . Hypertension   . IBS (irritable bowel syndrome)   . Migraine   . Muscle cramp   . Obesity   . Plantar fasciitis   . Rheumatoid arthritis ([place])   . Vitamin D deficiency     Patient Active Problem List   Diagnosis Date Noted  . Thyroid nodule 10/19/2016  . Hashimoto's disease 10/19/2016  . Persistent cough 06/23/2016  . Seasonal and perennial allergic rhinitis 06/23/2016  . GERD (gastroesophageal reflux disease) 06/23/2016  . Cough variant asthma 03/13/2016  . Severe obesity (BMI >= 40) ([place]) 03/13/2016  . Upper airway cough syndrome 03/12/2016    Past Surgical History:  Procedure Laterality Date  . COLONOSCOPY WITH PROPOFOL N/A 09/28/2018   Procedure: COLONOSCOPY WITH PROPOFOL;  Surgeon: Arta Silence, MD;  Location: [facility] ENDOSCOPY;  Service: Endoscopy;  Laterality: N/A;  .  POLYPECTOMY  09/28/2018   Procedure: POLYPECTOMY;  Surgeon: Arta Silence, MD;  Location: [facility] ENDOSCOPY;  Service: Endoscopy;;     OB History   None      Home Medications    Prior to Admission medications   Medication Sig Start Date End Date Taking? Authorizing Provider  albuterol (PROAIR HFA) 108 (90 Base) MCG/ACT inhaler Inhale 2 puffs into the lungs every 6 (six) hours as needed for wheezing or shortness of breath.    [provider]  amLODipine (NORVASC) 2.5 MG tablet Take 2.5 mg by mouth daily.     [provider]  beclomethasone (QVAR REDIHALER) 80 MCG/ACT inhaler Inhale 2 puffs into the lungs 2 (two) times daily. Patient taking differently: Inhale 2 puffs into the lungs 2 (two) times daily as needed (asthma).  02/14/18   Kozlow, Donnamarie Poag, MD  Beclomethasone Dipropionate (QNASL) 80 MCG/ACT AERS Place 2 sprays into the nose daily as needed (allergies).    [provider]  chlorpheniramine (CHLOR-TRIMETON) 4 MG tablet Take 4 mg by mouth daily as needed for allergies.     [provider]  Cholecalciferol (VITAMIN D3) 5000 units TABS Take 5,000 Units by mouth daily.     [provider]  cyclobenzaprine (FLEXERIL) 10 MG tablet Take 1 tablet (10 mg total) by mouth 3 (three) times daily as needed for muscle spasms. 11/22/17  Davonna Belling, MD  Eszopiclone 3 MG TABS Take 3 mg by mouth at bedtime as needed (sleep). Take immediately before bedtime     [provider]  famotidine (PEPCID) 20 MG tablet Take 20 mg by mouth daily as needed for heartburn. Reported on 06/23/2016    [provider]  hydrochlorothiazide (HYDRODIURIL) 25 MG tablet Take 25 mg by mouth daily.    [provider]  hydroxychloroquine (PLAQUENIL) 200 MG tablet Take 400 mg by mouth daily.    [provider]  irbesartan (AVAPRO) 300 MG tablet Take 300 mg by mouth daily.    [provider]  levonorgestrel-ethinyl estradiol  (SEASONALE,INTROVALE,JOLESSA) 0.15-0.03 MG tablet Take 1 tablet by mouth daily.    [provider]  Lifitegrast Shirley Friar) 5 % SOLN Place 1 drop into both eyes 2 (two) times daily.    [provider]  loratadine (CLARITIN) 10 MG tablet Take 10 mg by mouth daily. Reported on 06/23/2016    [provider]  meperidine (DEMEROL) 50 MG tablet Take 50 mg by mouth every 4 (four) hours as needed for severe pain.    Wallene Huh, DPM  mometasone-formoterol (DULERA) 200-5 MCG/ACT AERO Inhale 2 puffs into the lungs 2 (two) times daily.    [provider]  montelukast (SINGULAIR) 10 MG tablet Take 1 tablet (10 mg total) by mouth at bedtime. 03/12/16   Tanda Rockers, MD  naratriptan (AMERGE) 2.5 MG tablet TK 1 T PO QD PRF HA 09/22/18   [provider]  omeprazole (PRILOSEC) 40 MG capsule Take 40 mg by mouth daily.    [provider]  polyethylene glycol-electrolytes (NULYTELY/GOLYTELY) 420 g solution MIX AND DRINK UTD 09/23/18   [provider]  promethazine (PHENERGAN) 25 MG tablet Take 25 mg by mouth every 4 (four) hours as needed for nausea or vomiting.    Wallene Huh, DPM  Respiratory Therapy Supplies (FLUTTER) DEVI Use as directed 03/26/16   Tanda Rockers, MD  Tofacitinib Citrate (XELJANZ XR) 11 MG TB24 Take 11 mg by mouth daily.     [provider]  traMADol (ULTRAM) 50 MG tablet 1-2 every 4 hours as needed for cough or pain Patient taking differently: Take 50-100 mg by mouth every 4 (four) hours as needed (pain). 1-2 every 4 hours as needed for cough or pain 05/15/16   Tanda Rockers, MD  venlafaxine XR (EFFEXOR-XR) 150 MG 24 hr capsule Take 150 mg by mouth 2 (two) times daily.     [provider]    Family History Family History  Problem Relation Age of Onset  . Asthma Sister   . Hypertension Sister   . Hypercholesterolemia Sister   . Sleep apnea Sister   . Arthritis Sister   . Diabetes Sister   . Allergies  Sister   . Rheum arthritis Mother   . Hypertension Mother   . Diabetes Mother   . Rheum arthritis Sister   . Kidney disease Father   . Colon cancer Father   . Breast cancer Paternal Grandmother   . Colon cancer Paternal Grandfather     Social History Social History   Tobacco Use  . Smoking status: Never Smoker  . Smokeless tobacco: Never Used  Substance Use Topics  . Alcohol use: No    Alcohol/week: 0.0 standard drinks  . Drug use: No     Allergies   Amoxicillin; Codeine; Penicillins; Prednisone; and Sulfa antibiotics   Review of Systems Review of Systems  Constitutional: Negative for fever.  Eyes: Positive for pain, redness and itching.     Physical Exam Updated Vital Signs BP (!) 149/98 (BP Location: Right Wrist)   Pulse 84   Temp 98.9 F (37.2 C) (Oral)   Resp 18   SpO2 100%   Physical Exam  Constitutional: She is oriented to person, place, and time. She appears well-developed and well-nourished. No distress.  HENT:  Head: Normocephalic and atraumatic.  Right Ear: Tympanic membrane, external ear and ear canal normal.  Left Ear: Tympanic membrane, external ear and ear canal normal.  Nose: Nose normal.  Mouth/Throat: Uvula is midline, oropharynx is clear and moist and mucous membranes are normal.  Eyes: Pupils are equal, round, and reactive to light. EOM and lids are normal. Right conjunctiva is injected. Left conjunctiva is not injected. Left conjunctiva has no hemorrhage.  Slit lamp exam:      The right eye shows no corneal abrasion, no foreign body and no fluorescein uptake.  Injected conjunctive of the right eye. No consensual photophobia. Eye is tearing clear fluid. EOMI and PERRLA.  Pain resolved with tetracaine drops.  Fluorescein stain uptake or signs of corneal abrasion. Eye pressure 21, 19 on R eye, 28, 24 on L eye. Visual acuity attempted prior to tetracaine, pt couldn't not open her eye to perform.   Neck: Normal range of motion.  Cardiovascular:  Normal rate, regular rhythm and intact distal pulses.  Pulmonary/Chest: Effort normal and breath sounds normal. No respiratory distress. She has no wheezes.  speaking in full sentences.  Clear lung sounds in all fields.  Abdominal: She exhibits no distension.  Musculoskeletal: Normal range of motion.  Neurological: She is alert and oriented to person, place, and time.  Skin: Skin is warm. No rash noted.  Psychiatric: She has a normal mood and affect.  Nursing note and vitals reviewed.    ED Treatments / Results  Labs (all labs ordered are listed, but only abnormal results are displayed) Labs Reviewed - No data to display  EKG None  Radiology No results found.  Procedures Procedures (including critical care time)  Medications Ordered in ED Medications  trimethoprim-polymyxin b (POLYTRIM) ophthalmic solution 1 drop (has no administration in time range)  fluorescein ophthalmic strip 1 strip (1 strip Right Eye Given 10/15/18 2110)  tetracaine (PONTOCAINE) 0.5 % ophthalmic solution 2 drop (2 drops Both Eyes Given 10/15/18 2110)     Initial Impression / Assessment and Plan / ED Course  I have reviewed the triage vital signs and the nursing notes.  Pertinent labs & imaging results that were available during my care of the patient were reviewed by me and considered in my medical decision making (see chart for details).     Pt presenting for evaluation of right eye pain and redness.  Physical exam reassuring, pain improved with tetracaine.  Eye pressures reassuring bilaterally, slightly worse on the left eye but this is not the affected eye (this was also the first eye tested, and pt was flinching a lot).  No fluorescein stain uptake.  As such, doubt corneal abrasion, ulcer, or angle-closure glaucoma.  As patient has conjunctival injection, and symptoms began with itching, likely conjunctivitis.  Will cover for possible bacterial infection as patient does wear contacts.  Patient to  follow-up with optometry/ophthalmology on Monday if symptoms are not improving.  At this time, patient appears safe for discharge.  Return precautions given.  Patient states she understands treatment agrees to plan.  Final Clinical Impressions(s) /  ED Diagnoses   Final diagnoses:  Acute right eye pain    ED Discharge Orders    None       Franchot Heidelberg, PA-C 10/15/18 2207    Virgel Manifold, MD 10/16/18 2351

## 2018-10-20 ENCOUNTER — Ambulatory Visit (INDEPENDENT_AMBULATORY_CARE_PROVIDER_SITE_OTHER): Payer: 59 | Admitting: *Deleted

## 2018-10-20 DIAGNOSIS — J309 Allergic rhinitis, unspecified: Secondary | ICD-10-CM | POA: Diagnosis not present

## 2018-10-25 ENCOUNTER — Ambulatory Visit (INDEPENDENT_AMBULATORY_CARE_PROVIDER_SITE_OTHER): Payer: 59

## 2018-10-25 DIAGNOSIS — J309 Allergic rhinitis, unspecified: Secondary | ICD-10-CM | POA: Diagnosis not present

## 2018-11-07 ENCOUNTER — Ambulatory Visit
Admission: RE | Admit: 2018-11-07 | Discharge: 2018-11-07 | Disposition: A | Discharge status: Home / Self Care | Payer: PRIVATE HEALTH INSURANCE | Source: Ambulatory Visit | Attending: Family Medicine | Admitting: Family Medicine

## 2018-11-07 DIAGNOSIS — Z1231 Encounter for screening mammogram for malignant neoplasm of breast: Secondary | ICD-10-CM

## 2018-11-08 ENCOUNTER — Ambulatory Visit (INDEPENDENT_AMBULATORY_CARE_PROVIDER_SITE_OTHER): Payer: 59 | Admitting: *Deleted

## 2018-11-08 DIAGNOSIS — J309 Allergic rhinitis, unspecified: Secondary | ICD-10-CM | POA: Diagnosis not present

## 2018-11-09 ENCOUNTER — Ambulatory Visit: Payer: 59 | Admitting: Podiatry

## 2018-11-09 ENCOUNTER — Telehealth: Payer: Self-pay | Admitting: Podiatry

## 2018-11-09 ENCOUNTER — Encounter: Payer: Self-pay | Admitting: Podiatry

## 2018-11-09 DIAGNOSIS — M722 Plantar fascial fibromatosis: Secondary | ICD-10-CM

## 2018-11-09 MED ORDER — TRIAMCINOLONE ACETONIDE 10 MG/ML IJ SUSP
10.0000 mg | Freq: Once | INTRAMUSCULAR | Status: AC
  Administered 2018-11-09: 10 mg

## 2018-11-09 NOTE — Progress Notes (Signed)
Subjective:   Patient ID: Brittany Riggs, female   DOB: 47 y.o.   MRN: [id number]   HPI Patient presents stating to have this once bottom of the bottom I feel that remains sore and it seems like it is more in the arch   ROS      Objective:  Physical Exam  Neurovascular status intact with patient's right lateral arch be more tender than the actual heel itself with again obesity complicating factor     Assessment:  Appears to be in a different spot with fasciitis symptoms noted plantar right distal to the insertion calcaneus     Plan:  I worked on this new area and I did a lateral injection of this area 3 mg Kenalog 5 mg Xylocaine and will see back to recheck again in the next few weeks

## 2018-11-09 NOTE — Telephone Encounter (Signed)
Pt is currently in the [facility] lobby.

## 2018-11-09 NOTE — Telephone Encounter (Signed)
Pt is scheduled for an appt. Today, but wants to speak with you about, what she can do for her foot pain

## 2018-11-30 ENCOUNTER — Ambulatory Visit (INDEPENDENT_AMBULATORY_CARE_PROVIDER_SITE_OTHER): Payer: 59 | Admitting: *Deleted

## 2018-11-30 DIAGNOSIS — J309 Allergic rhinitis, unspecified: Secondary | ICD-10-CM

## 2018-12-15 ENCOUNTER — Ambulatory Visit (INDEPENDENT_AMBULATORY_CARE_PROVIDER_SITE_OTHER): Payer: 59 | Admitting: *Deleted

## 2018-12-15 DIAGNOSIS — J309 Allergic rhinitis, unspecified: Secondary | ICD-10-CM

## 2018-12-26 ENCOUNTER — Other Ambulatory Visit: Payer: Self-pay | Admitting: Podiatry

## 2018-12-26 ENCOUNTER — Ambulatory Visit (INDEPENDENT_AMBULATORY_CARE_PROVIDER_SITE_OTHER): Payer: 59

## 2018-12-26 ENCOUNTER — Ambulatory Visit: Payer: 59

## 2018-12-26 ENCOUNTER — Encounter: Payer: Self-pay | Admitting: Podiatry

## 2018-12-26 ENCOUNTER — Ambulatory Visit: Payer: 59 | Admitting: Podiatry

## 2018-12-26 DIAGNOSIS — M722 Plantar fascial fibromatosis: Secondary | ICD-10-CM

## 2018-12-26 DIAGNOSIS — M79671 Pain in right foot: Secondary | ICD-10-CM

## 2018-12-26 DIAGNOSIS — M79672 Pain in left foot: Secondary | ICD-10-CM

## 2018-12-26 MED ORDER — TRIAMCINOLONE ACETONIDE 10 MG/ML IJ SUSP: 10.0000 mg | Freq: Once | INTRAMUSCULAR | Status: DC

## 2018-12-28 ENCOUNTER — Ambulatory Visit: Payer: 59 | Admitting: Podiatry

## 2019-01-02 ENCOUNTER — Telehealth: Payer: Self-pay | Admitting: Podiatry

## 2019-01-02 NOTE — Telephone Encounter (Signed)
Left message informing pt she could come in and one of the assistants could help her get another boot, and I had sent a message to the front reception and they were to be completing a 3 month handicap sticker for her.

## 2019-01-02 NOTE — Telephone Encounter (Signed)
I saw Dr. Paulla Dolly last week and he told me to go back into the boot. I was wondering if I could get a handicap placard because I have to park so far away at work. I can be reached at [phone] which is my mobile or at [phone] which is my desk.

## 2019-01-02 NOTE — Telephone Encounter (Signed)
I was there on the 13th , and you said I needed to wear my boot, I thought I could use the boot I had previoulsy. I need another boot because this boot is painful. I also need a handicap sticker. I can be reached at the number I have on file

## 2019-01-02 NOTE — Progress Notes (Signed)
Subjective:   Patient ID: Brittany Riggs, female   DOB: 48 y.o.   MRN: [id number]   HPI Extreme obese patient with chronic foot pain that we continue to try to work on conservatively who has developed a new place of pain in the plantar lateral arch more proximal to that where it was at last visit stating injections helped her but she finds new spots   ROS      Objective:  Physical Exam  Neurovascular status intact with obesity certainly is complicating factor with inflammation plantar lateral surface proximal to the point that we worked on at previous visit     Assessment:  Continued acute plantar fasciitis and patient that we have great difficulty providing other services     Plan:  Recommended careful steroid injection explained that we are going to different areas and that this is a difficult problem but will try and avoid surgery to the be very difficult for her to recover from.  I did do plantar fascial injection lateral side 3 Milgram Kenalog 5 mg Xylocaine which was tolerated well

## 2019-01-03 ENCOUNTER — Ambulatory Visit (INDEPENDENT_AMBULATORY_CARE_PROVIDER_SITE_OTHER): Payer: 59 | Admitting: *Deleted

## 2019-01-03 DIAGNOSIS — J309 Allergic rhinitis, unspecified: Secondary | ICD-10-CM | POA: Diagnosis not present

## 2019-01-03 NOTE — Progress Notes (Signed)
Exp 01/04/20

## 2019-01-04 DIAGNOSIS — J301 Allergic rhinitis due to pollen: Secondary | ICD-10-CM

## 2019-01-24 ENCOUNTER — Ambulatory Visit (INDEPENDENT_AMBULATORY_CARE_PROVIDER_SITE_OTHER): Payer: 59 | Admitting: *Deleted

## 2019-01-24 DIAGNOSIS — J309 Allergic rhinitis, unspecified: Secondary | ICD-10-CM | POA: Diagnosis not present

## 2019-01-30 ENCOUNTER — Encounter: Payer: Self-pay | Admitting: Podiatry

## 2019-01-30 ENCOUNTER — Ambulatory Visit: Payer: 59 | Admitting: Podiatry

## 2019-01-30 DIAGNOSIS — M79671 Pain in right foot: Secondary | ICD-10-CM | POA: Diagnosis not present

## 2019-02-01 NOTE — Progress Notes (Signed)
Subjective:   Patient ID: Brittany Riggs, female   DOB: 48 y.o.   MRN: [id number]   HPI Patient states overall I am starting to show improvement but I still have pain if I do too much and the boot is starting to fall apart   ROS      Objective:  Physical Exam  Extreme obese female who has had chronic foot pain who is finally turning the corner with immobilization with tremendous stress on the boot     Assessment:  Overall doing well with pain which seems to be receding     Plan:  Recommended continued boot usage ice therapy and reduced activity and patient will be seen back for [place] to recheck again as symptoms indicate but at this point I am optimistic were over-the-top with this and hopefully she is on the road to complete recovery but obesity is always a complicating factor in her case

## 2019-02-22 ENCOUNTER — Ambulatory Visit (INDEPENDENT_AMBULATORY_CARE_PROVIDER_SITE_OTHER): Payer: 59 | Admitting: *Deleted

## 2019-02-22 DIAGNOSIS — J309 Allergic rhinitis, unspecified: Secondary | ICD-10-CM

## 2019-03-09 ENCOUNTER — Ambulatory Visit (INDEPENDENT_AMBULATORY_CARE_PROVIDER_SITE_OTHER): Payer: 59

## 2019-03-09 DIAGNOSIS — J309 Allergic rhinitis, unspecified: Secondary | ICD-10-CM | POA: Diagnosis not present

## 2019-03-17 ENCOUNTER — Ambulatory Visit (INDEPENDENT_AMBULATORY_CARE_PROVIDER_SITE_OTHER): Payer: 59 | Admitting: *Deleted

## 2019-03-17 DIAGNOSIS — J309 Allergic rhinitis, unspecified: Secondary | ICD-10-CM | POA: Diagnosis not present

## 2019-04-04 ENCOUNTER — Ambulatory Visit (INDEPENDENT_AMBULATORY_CARE_PROVIDER_SITE_OTHER): Payer: 59 | Admitting: *Deleted

## 2019-04-04 DIAGNOSIS — J309 Allergic rhinitis, unspecified: Secondary | ICD-10-CM | POA: Diagnosis not present

## 2019-04-05 ENCOUNTER — Ambulatory Visit (INDEPENDENT_AMBULATORY_CARE_PROVIDER_SITE_OTHER): Payer: 59 | Admitting: Internal Medicine

## 2019-04-05 ENCOUNTER — Encounter: Payer: Self-pay | Admitting: Internal Medicine

## 2019-04-05 ENCOUNTER — Other Ambulatory Visit: Payer: Self-pay

## 2019-04-05 DIAGNOSIS — E041 Nontoxic single thyroid nodule: Secondary | ICD-10-CM

## 2019-04-05 DIAGNOSIS — E063 Autoimmune thyroiditis: Secondary | ICD-10-CM

## 2019-04-05 DIAGNOSIS — R7303 Prediabetes: Secondary | ICD-10-CM | POA: Insufficient documentation

## 2019-04-05 NOTE — Patient Instructions (Signed)
We will schedule a thyroid ultrasound for you.  Please let me know if Dr. Trudie Reed checked a TSH recently.  Please come back for a follow-up appointment in 1 year.
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
## 2019-04-06 ENCOUNTER — Telehealth: Payer: Self-pay

## 2019-04-06 NOTE — Telephone Encounter (Signed)
Received fax for patient's lab results.  Report given to Dr. Cruzita Lederer to review and sent to scan.

## 2019-04-10 ENCOUNTER — Ambulatory Visit: Payer: 59 | Admitting: Podiatry

## 2019-04-10 ENCOUNTER — Ambulatory Visit (INDEPENDENT_AMBULATORY_CARE_PROVIDER_SITE_OTHER): Payer: 59

## 2019-04-10 ENCOUNTER — Encounter: Payer: Self-pay | Admitting: Podiatry

## 2019-04-10 ENCOUNTER — Other Ambulatory Visit: Payer: Self-pay

## 2019-04-10 VITALS — Temp 97.2°F

## 2019-04-10 DIAGNOSIS — R600 Localized edema: Secondary | ICD-10-CM | POA: Diagnosis not present

## 2019-04-10 DIAGNOSIS — S9031XA Contusion of right foot, initial encounter: Secondary | ICD-10-CM | POA: Diagnosis not present

## 2019-04-10 NOTE — Progress Notes (Signed)
Subjective:   Patient ID: Brittany Riggs, female   DOB: 48 y.o.   MRN: [id number]   HPI Patient states she fell coming down the stairs on her right foot and felt a pop and it is been very tender and also in her ankle.  Patient states that up till then her foot was feeling great and we have dealt with chronic obesity with this patient which is made this a very difficult problem   ROS      Objective:  Physical Exam  Neurovascular status unchanged negative Homans sign was noted with quite a bit of discomfort in the mid arch right with inflammation fluid noted and swelling of +1 pitting edema in the ankle secondary to trauma with patient having put a tremendous weight force on her foot when falling down a step     Assessment:  Strong possibility for a tear of the plantar fascial right with obesity and injury along with ankle injury     Plan:  H&P x-rays reviewed and today as precautionary measure I placed her and Unna boot to try to give her good support and air fracture walker to completely immobilize.  Patient will be seen back to recheck and was given strict instructions if any changes were to occur to let us know immediately  X-ray was negative for signs of fracture appears to be soft tissue injury

## 2019-04-13 ENCOUNTER — Ambulatory Visit (INDEPENDENT_AMBULATORY_CARE_PROVIDER_SITE_OTHER): Payer: 59

## 2019-04-13 DIAGNOSIS — J309 Allergic rhinitis, unspecified: Secondary | ICD-10-CM

## 2019-04-18 ENCOUNTER — Other Ambulatory Visit: Payer: Self-pay | Admitting: Family Medicine

## 2019-04-18 ENCOUNTER — Ambulatory Visit (INDEPENDENT_AMBULATORY_CARE_PROVIDER_SITE_OTHER): Payer: 59 | Admitting: *Deleted

## 2019-04-18 DIAGNOSIS — N183 Chronic kidney disease, stage 3 unspecified: Secondary | ICD-10-CM

## 2019-04-18 DIAGNOSIS — J309 Allergic rhinitis, unspecified: Secondary | ICD-10-CM

## 2019-04-25 ENCOUNTER — Ambulatory Visit
Admission: RE | Admit: 2019-04-25 | Discharge: 2019-04-25 | Disposition: A | Discharge status: Home / Self Care | Payer: PRIVATE HEALTH INSURANCE | Source: Ambulatory Visit | Attending: Family Medicine | Admitting: Family Medicine

## 2019-04-25 ENCOUNTER — Ambulatory Visit (INDEPENDENT_AMBULATORY_CARE_PROVIDER_SITE_OTHER): Payer: 59 | Admitting: *Deleted

## 2019-04-25 ENCOUNTER — Ambulatory Visit
Admission: RE | Admit: 2019-04-25 | Discharge: 2019-04-25 | Disposition: A | Discharge status: Home / Self Care | Payer: 59 | Source: Ambulatory Visit | Attending: Internal Medicine | Admitting: Internal Medicine

## 2019-04-25 DIAGNOSIS — J309 Allergic rhinitis, unspecified: Secondary | ICD-10-CM

## 2019-04-25 DIAGNOSIS — E041 Nontoxic single thyroid nodule: Secondary | ICD-10-CM

## 2019-04-25 DIAGNOSIS — N183 Chronic kidney disease, stage 3 unspecified: Secondary | ICD-10-CM

## 2019-04-27 ENCOUNTER — Encounter: Payer: Self-pay | Admitting: Internal Medicine

## 2019-05-09 ENCOUNTER — Ambulatory Visit (INDEPENDENT_AMBULATORY_CARE_PROVIDER_SITE_OTHER): Payer: 59 | Admitting: *Deleted

## 2019-05-09 DIAGNOSIS — J309 Allergic rhinitis, unspecified: Secondary | ICD-10-CM

## 2019-05-19 ENCOUNTER — Encounter: Payer: Self-pay | Admitting: Podiatry

## 2019-05-19 ENCOUNTER — Other Ambulatory Visit: Payer: Self-pay

## 2019-05-19 ENCOUNTER — Ambulatory Visit: Payer: 59 | Admitting: Podiatry

## 2019-05-19 VITALS — Temp 97.0°F

## 2019-05-19 DIAGNOSIS — M722 Plantar fascial fibromatosis: Secondary | ICD-10-CM | POA: Diagnosis not present

## 2019-05-19 MED ORDER — TRIAMCINOLONE ACETONIDE 10 MG/ML IJ SUSP
10.0000 mg | Freq: Once | INTRAMUSCULAR | Status: AC
  Administered 2019-05-19: 10 mg

## 2019-05-22 NOTE — Progress Notes (Signed)
Subjective:   Patient ID: Brittany Riggs, female   DOB: 48 y.o.   MRN: [id number]   HPI Patient presents stating having a lot of discomfort in this 1 area and I feel like I think he get an injection that I may be better   ROS      Objective:  Physical Exam  Neurovascular status intact with patient found to have plantar lateral inflammation distal to the insertion calcaneus with fluid buildup     Assessment:  Acute plantar fasciitis right with inflammation of the lateral band     Plan:  H&P condition reviewed and did injection of the lateral band 3 mg Kenalog 5 mg Xylocaine into the area of maximum discomfort which had not been worked on previously.  Discussed continued orthotic therapy

## 2019-05-26 ENCOUNTER — Ambulatory Visit (INDEPENDENT_AMBULATORY_CARE_PROVIDER_SITE_OTHER): Payer: 59 | Admitting: *Deleted

## 2019-05-26 DIAGNOSIS — J309 Allergic rhinitis, unspecified: Secondary | ICD-10-CM

## 2019-05-29 NOTE — Progress Notes (Signed)
Vials exp 05-28-1020

## 2019-05-30 ENCOUNTER — Ambulatory Visit (INDEPENDENT_AMBULATORY_CARE_PROVIDER_SITE_OTHER): Payer: 59 | Admitting: *Deleted

## 2019-05-30 ENCOUNTER — Ambulatory Visit: Payer: Self-pay | Admitting: *Deleted

## 2019-05-30 DIAGNOSIS — J309 Allergic rhinitis, unspecified: Secondary | ICD-10-CM | POA: Diagnosis not present

## 2019-06-01 DIAGNOSIS — J301 Allergic rhinitis due to pollen: Secondary | ICD-10-CM | POA: Diagnosis not present

## 2019-06-15 ENCOUNTER — Ambulatory Visit (INDEPENDENT_AMBULATORY_CARE_PROVIDER_SITE_OTHER): Payer: 59 | Admitting: *Deleted

## 2019-06-15 DIAGNOSIS — J309 Allergic rhinitis, unspecified: Secondary | ICD-10-CM

## 2019-06-23 ENCOUNTER — Ambulatory Visit (INDEPENDENT_AMBULATORY_CARE_PROVIDER_SITE_OTHER): Payer: 59 | Admitting: *Deleted

## 2019-06-23 DIAGNOSIS — J309 Allergic rhinitis, unspecified: Secondary | ICD-10-CM

## 2019-06-27 ENCOUNTER — Other Ambulatory Visit: Payer: Self-pay | Admitting: Family Medicine

## 2019-06-27 ENCOUNTER — Other Ambulatory Visit (HOSPITAL_COMMUNITY)
Admission: RE | Admit: 2019-06-27 | Discharge: 2019-06-27 | Disposition: A | Discharge status: Home / Self Care | Payer: 59 | Source: Ambulatory Visit | Attending: Family Medicine | Admitting: Family Medicine

## 2019-06-27 DIAGNOSIS — Z124 Encounter for screening for malignant neoplasm of cervix: Secondary | ICD-10-CM | POA: Diagnosis not present

## 2019-06-29 ENCOUNTER — Encounter: Payer: Self-pay | Admitting: Allergy

## 2019-06-29 ENCOUNTER — Other Ambulatory Visit: Payer: Self-pay

## 2019-06-29 ENCOUNTER — Ambulatory Visit (INDEPENDENT_AMBULATORY_CARE_PROVIDER_SITE_OTHER): Payer: 59 | Admitting: Allergy

## 2019-06-29 DIAGNOSIS — J4540 Moderate persistent asthma, uncomplicated: Secondary | ICD-10-CM | POA: Diagnosis not present

## 2019-06-29 DIAGNOSIS — J302 Other seasonal allergic rhinitis: Secondary | ICD-10-CM | POA: Insufficient documentation

## 2019-06-29 DIAGNOSIS — J3089 Other allergic rhinitis: Secondary | ICD-10-CM

## 2019-06-29 DIAGNOSIS — J4551 Severe persistent asthma with (acute) exacerbation: Secondary | ICD-10-CM | POA: Insufficient documentation

## 2019-06-29 MED ORDER — MOMETASONE FURO-FORMOTEROL FUM 200-5 MCG/ACT IN AERO: 2.0000 | INHALATION_SPRAY | Freq: Two times a day (BID) | RESPIRATORY_TRACT | 5 refills | Status: DC

## 2019-06-29 MED ORDER — OMEPRAZOLE 40 MG PO CPDR: 40.0000 mg | DELAYED_RELEASE_CAPSULE | Freq: Every day | ORAL | 2 refills | Status: DC

## 2019-06-29 MED ORDER — QVAR REDIHALER 80 MCG/ACT IN AERB: 2.0000 | INHALATION_SPRAY | Freq: Two times a day (BID) | RESPIRATORY_TRACT | 3 refills | Status: DC | PRN

## 2019-06-29 MED ORDER — MONTELUKAST SODIUM 10 MG PO TABS: 10.0000 mg | ORAL_TABLET | Freq: Every day | ORAL | 5 refills | Status: DC

## 2019-06-29 MED ORDER — ALBUTEROL SULFATE HFA 108 (90 BASE) MCG/ACT IN AERS: 2.0000 | INHALATION_SPRAY | Freq: Four times a day (QID) | RESPIRATORY_TRACT | 1 refills | Status: DC | PRN

## 2019-06-29 NOTE — Assessment & Plan Note (Addendum)
Past history - on AIT: G/Weeds/T + M/DM/C/CR since 07/02/2016. Nasal sprays cause epistaxis. Patient has dry eyes.  Interim history - symptoms stable.  Continue allergy injections every 2 weeks.   Continue environmental control measures.   May use over the counter antihistamines such as Zyrtec (cetirizine), Claritin (loratadine), Allegra (fexofenadine), or Xyzal (levocetirizine) daily as needed.  Restart Singulair 10mg  daily at night.

## 2019-06-29 NOTE — Assessment & Plan Note (Signed)
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 No oral prednisone since last visit in Nov 2018. Today's ACT score 20.  Daily controller medication(s): Dulera 200 2 puffs twice a day and rinse mouth afterwards. Restart Singulair 10mg  daily at night.  Prior to physical activity: May use albuterol rescue inhaler 2 puffs 5 to 15 minutes prior to strenuous physical activities. Rescue medications: May use albuterol rescue inhaler 2 puffs or nebulizer every 4 to 6 hours as needed for shortness of breath, chest tightness, coughing, and wheezing. Monitor frequency of use.  During upper respiratory infections: Start Qvar 80 2 puffs twice a day for 1-2 weeks. Rinse mouth afterwards.  Patient needs in office visit within the next 1-2 months as we need to repeat spirometry.

## 2019-06-29 NOTE — Patient Instructions (Addendum)
Allergic rhinitis:  Continue allergy injections every 2 weeks.   Continue environmental control measures as below.   May use over the counter antihistamines such as Zyrtec (cetirizine), Claritin (loratadine), Allegra (fexofenadine), or Xyzal (levocetirizine) daily as needed.  Restart Singulair 10mg  daily at night.   Asthma: . Daily controller medication(s): Dulera 200 2 puffs twice a day and rinse mouth afterwards.  o Restart Singulair 10mg  daily at night.  . Prior to physical activity: May use albuterol rescue inhaler 2 puffs 5 to 15 minutes prior to strenuous physical activities. Marland Kitchen Rescue medications: May use albuterol rescue inhaler 2 puffs or nebulizer every 4 to 6 hours as needed for shortness of breath, chest tightness, coughing, and wheezing. Monitor frequency of use.  . During upper respiratory infections: Start Qvar 80 2 puffs twice a day for 1-2 weeks. Rinse mouth afterwards.  . Asthma control goals:  o Full participation in all desired activities (may need albuterol before activity) o Albuterol use two times or less a week on average (not counting use with activity) o Cough interfering with sleep two times or less a month o Oral steroids no more than once a year o No hospitalizations  Follow up in 4 weeks to recheck asthma in the office. We will get a breathing test at that time as well.   Reducing Pollen Exposure . Pollen seasons: trees (spring), grass (summer) and ragweed/weeds (fall). Marland Kitchen Keep windows closed in your home and car to lower pollen exposure.  Susa Simmonds air conditioning in the bedroom and throughout the house if possible.  . Avoid going out in dry windy days - especially early morning. . Pollen counts are highest between 5 - 10 AM and on dry, hot and windy days.  . Save outside activities for late afternoon or after a heavy rain, when pollen levels are lower.  . Avoid mowing of grass if you have grass pollen allergy. Marland Kitchen Be aware that pollen can also be  transported indoors on people and pets.  . Dry your clothes in an automatic dryer rather than hanging them outside where they might collect pollen.  . Rinse hair and eyes before bedtime. Control of House Dust Mite Allergen . Dust mite allergens are a common trigger of allergy and asthma symptoms. While they can be found throughout the house, these microscopic creatures thrive in warm, humid environments such as bedding, upholstered furniture and carpeting. . Because so much time is spent in the bedroom, it is essential to reduce mite levels there.  . Encase pillows, mattresses, and box springs in special allergen-proof fabric covers or airtight, zippered plastic covers.  . Bedding should be washed weekly in hot water (130 F) and dried in a hot dryer. Allergen-proof covers are available for comforters and pillows that can't be regularly washed.  Wendee Copp the allergy-proof covers every few months. Minimize clutter in the bedroom. Keep pets out of the bedroom.  Marland Kitchen Keep humidity less than 50% by using a dehumidifier or air conditioning. You can buy a humidity measuring device called a hygrometer to monitor this.  . If possible, replace carpets with hardwood, linoleum, or washable area rugs. If that's not possible, vacuum frequently with a vacuum that has a HEPA filter. . Remove all upholstered furniture and non-washable window drapes from the bedroom. . Remove all non-washable stuffed toys from the bedroom.  Wash stuffed toys weekly. Mold Control . Mold and fungi can grow on a variety of surfaces provided certain temperature and moisture conditions exist.  .  Outdoor molds grow on plants, decaying vegetation and soil. The major outdoor mold, Alternaria and Cladosporium, are found in very high numbers during hot and dry conditions. Generally, a late summer - fall peak is seen for common outdoor fungal spores. Rain will temporarily lower outdoor mold spore count, but counts rise rapidly when the rainy period  ends. . The most important indoor molds are Aspergillus and Penicillium. Dark, humid and poorly ventilated basements are ideal sites for mold growth. The next most common sites of mold growth are the bathroom and the kitchen. Outdoor (Seasonal) Mold Control . Use air conditioning and keep windows closed. . Avoid exposure to decaying vegetation. Marland Kitchen Avoid leaf raking. . Avoid grain handling. . Consider wearing a face mask if working in moldy areas.  Indoor (Perennial) Mold Control  . Maintain humidity below 50%. . Get rid of mold growth on hard surfaces with water, detergent and, if necessary, 5% bleach (do not mix with other cleaners). Then dry the area completely. If mold covers an area more than 10 square feet, consider hiring an indoor environmental professional. . For clothing, washing with soap and water is best. If moldy items cannot be cleaned and dried, throw them away. . Remove sources e.g. contaminated carpets. . Repair and seal leaking roofs or pipes. Using dehumidifiers in damp basements may be helpful, but empty the water and clean units regularly to prevent mildew from forming. All rooms, especially basements, bathrooms and kitchens, require ventilation and cleaning to deter mold and mildew growth. Avoid carpeting on concrete or damp floors, and storing items in damp areas. Pet Allergen Avoidance: . Contrary to popular opinion, there are no "hypoallergenic" breeds of dogs or cats. That is because people are not allergic to an animal's hair, but to an allergen found in the animal's saliva, dander (dead skin flakes) or urine. Pet allergy symptoms typically occur within minutes. For some people, symptoms can build up and become most severe 8 to 12 hours after contact with the animal. People with severe allergies can experience reactions in public places if dander has been transported on the pet owners' clothing. Marland Kitchen Keeping an animal outdoors is only a partial solution, since homes with pets  in the yard still have higher concentrations of animal allergens. . Before getting a pet, ask your allergist to determine if you are allergic to animals. If your pet is already considered part of your family, try to minimize contact and keep the pet out of the bedroom and other rooms where you spend a great deal of time. . As with dust mites, vacuum carpets often or replace carpet with a hardwood floor, tile or linoleum. . High-efficiency particulate air (HEPA) cleaners can reduce allergen levels over time. . While dander and saliva are the source of cat and dog allergens, urine is the source of allergens from rabbits, hamsters, mice and [place] pigs; so ask a non-allergic family member to clean the animal's cage. . If you have a pet allergy, talk to your allergist about the potential for allergy immunotherapy (allergy shots). This strategy can often provide long-term relief. Cockroach Allergen Avoidance Cockroaches are often found in the homes of densely populated urban areas, schools or commercial buildings, but these creatures can lurk almost anywhere. This does not mean that you have a dirty house or living area. . Block all areas where roaches can enter the home. This includes crevices, wall cracks and windows.  . Cockroaches need water to survive, so fix and seal all leaky faucets  and pipes. Have an exterminator go through the house when your family and pets are gone to eliminate any remaining roaches. Marland Kitchen Keep food in lidded containers and put pet food dishes away after your pets are done eating. Vacuum and sweep the floor after meals, and take out garbage and recyclables. Use lidded garbage containers in the kitchen. Wash dishes immediately after use and clean under stoves, refrigerators or toasters where crumbs can accumulate. Wipe off the stove and other kitchen surfaces and cupboards regularly.

## 2019-06-29 NOTE — Progress Notes (Signed)
RE: Brittany Riggs MRN: [id number] DOB: 05/07/1971 Date of Telemedicine Visit: 06/29/2019  Referring provider: [name], MD Primary care provider: [name], MD  Chief Complaint: Allergic Rhinitis  (Patient gave verbal consent to treat and bill insurance for this visit.)   Telemedicine Follow Up Visit via Telephone: I connected with Elysabeth Aust for a follow up on 06/29/19 by telephone and verified that I am speaking with the correct person using two identifiers.   I discussed the limitations, risks, security and privacy concerns of performing an evaluation and management service by telephone and the availability of in person appointments. I also discussed with the patient that there may be a patient responsible charge related to this service. The patient expressed understanding and agreed to proceed.  Patient is at home.  Provider is at the office.  Visit start time: 11:33AM Visit end time: 12:05PM Insurance consent/check in by: front desk Medical consent and medical assistant/nurse: Aileen Pilot.   History of Present Illness: She is a 48 y.o. female, who is being followed for  asthma, LPRD, allergic rhinitis. Her previous allergy office visit was on 11/03/2017 with Dr. Neldon Mc. Today is a regular follow up visit.  Allergic rhinitis: Doing well with bi weekly injections with some localized reactions which are the size of a quarter. This resolves within 1-2 days. Sometimes coming in weekly for injections but no worsening symptoms if comes in every 2 weeks.   Currently on Claritin and Singulair daily. Ran out of Singulair 3 weeks ago and noticed some chest tightness.  Using Xiidra for severe dry eyes.   Not needed to use nasal spray. They usually caused epistaxis.   Asthma: Currently on Dulera 100 2 puffs BID and Qvar 80 1 puff BID. Has been having increased chest tightness since ran out of Singulair. This mainly occurs when she is outdoors and it is very humid. She is  also out of albuterol. She is not sure when the dose of Dulera was decreased form 200 to 100.   Denies any SOB, coughing, wheezing, nocturnal awakenings, ER/urgent care visits or prednisone use since the last visit.  Assessment and Plan: Estie is a 48 y.o. female with: Not well controlled moderate persistent asthma Chest tightness since ran out of Singulair 3 weeks ago. No oral prednisone since last visit in Nov 2018. Today's ACT score 20.  . Daily controller medication(s): Dulera 200 2 puffs twice a day and rinse mouth afterwards. o Restart Singulair 10mg  daily at night.  . Prior to physical activity: May use albuterol rescue inhaler 2 puffs 5 to 15 minutes prior to strenuous physical activities. Marland Kitchen Rescue medications: May use albuterol rescue inhaler 2 puffs or nebulizer every 4 to 6 hours as needed for shortness of breath, chest tightness, coughing, and wheezing. Monitor frequency of use.  . During upper respiratory infections: Start Qvar 80 2 puffs twice a day for 1-2 weeks. Rinse mouth afterwards.  . Patient needs in office visit within the next 1-2 months as we need to repeat spirometry.  Seasonal and perennial allergic rhinitis Past history - on AIT: G/Weeds/T + M/DM/C/CR since 07/02/2016. Nasal sprays cause epistaxis. Patient has dry eyes.  Interim history - symptoms stable.  Continue allergy injections every 2 weeks.   Continue environmental control measures.   May use over the counter antihistamines such as Zyrtec (cetirizine), Claritin (loratadine), Allegra (fexofenadine), or Xyzal (levocetirizine) daily as needed.  Restart Singulair 10mg  daily at night.   Return in about 4 weeks (around 07/27/2019).  Meds ordered this encounter  Medications  . montelukast (SINGULAIR) 10 MG tablet    Sig: Take 1 tablet (10 mg total) by mouth at bedtime.    Dispense:  30 tablet    Refill:  5  . albuterol (PROAIR HFA) 108 (90 Base) MCG/ACT inhaler    Sig: Inhale 2 puffs into the lungs every  6 (six) hours as needed for wheezing or shortness of breath.    Dispense:  8 g    Refill:  1  . beclomethasone (QVAR REDIHALER) 80 MCG/ACT inhaler    Sig: Inhale 2 puffs into the lungs 2 (two) times daily as needed (asthma).    Dispense:  10.6 g    Refill:  3  . mometasone-formoterol (DULERA) 200-5 MCG/ACT AERO    Sig: Inhale 2 puffs into the lungs 2 (two) times daily.    Dispense:  13 g    Refill:  5  . omeprazole (PRILOSEC) 40 MG capsule    Sig: Take 1 capsule (40 mg total) by mouth daily.    Dispense:  90 capsule    Refill:  2   Lab Orders  No laboratory test(s) ordered today    Diagnostics: None.  Medication List:  Current Outpatient Medications  Medication Sig Dispense Refill  . albuterol (PROAIR HFA) 108 (90 Base) MCG/ACT inhaler Inhale 2 puffs into the lungs every 6 (six) hours as needed for wheezing or shortness of breath. 8 g 1  . beclomethasone (QVAR REDIHALER) 80 MCG/ACT inhaler Inhale 2 puffs into the lungs 2 (two) times daily as needed (asthma). 10.6 g 3  . Beclomethasone Dipropionate (QNASL) 80 MCG/ACT AERS Place 2 sprays into the nose daily as needed (allergies).    . carvedilol (COREG) 3.125 MG tablet TK 1 T PO BID    . chlorpheniramine (CHLOR-TRIMETON) 4 MG tablet Take 4 mg by mouth daily as needed for allergies.     . chlorthalidone (HYGROTON) 25 MG tablet TK 1 T PO QD IN THE MORNING WF  5  . Cholecalciferol (VITAMIN D3) 5000 units TABS Take 5,000 Units by mouth daily.     . cyclobenzaprine (FLEXERIL) 10 MG tablet Take 1 tablet (10 mg total) by mouth 3 (three) times daily as needed for muscle spasms. 10 tablet 0  . erythromycin with ethanol (EMGEL) 2 % gel APP EXT AA BID    . Eszopiclone 3 MG TABS Take 3 mg by mouth at bedtime as needed (sleep). Take immediately before bedtime     . famotidine (PEPCID) 20 MG tablet Take 20 mg by mouth daily as needed for heartburn. Reported on 06/23/2016    . fluticasone (CUTIVATE) 0.05 % cream APPLY 1 APPLICATION TO FACE BID FOR  1 WEEK    . hydrochlorothiazide (HYDRODIURIL) 25 MG tablet Take 25 mg by mouth daily.    . hydrocortisone 2.5 % cream APPLY TOPICALLY BID FOR 14 DAYS    . hydroxychloroquine (PLAQUENIL) 200 MG tablet Take 400 mg by mouth daily.    . irbesartan (AVAPRO) 300 MG tablet Take 300 mg by mouth daily.    Marland Kitchen levonorgestrel-ethinyl estradiol (SEASONALE,INTROVALE,JOLESSA) 0.15-0.03 MG tablet Take 1 tablet by mouth daily.    Marland Kitchen Lifitegrast (XIIDRA) 5 % SOLN Place 1 drop into both eyes 2 (two) times daily.    Marland Kitchen loratadine (CLARITIN) 10 MG tablet Take 10 mg by mouth daily. Reported on 06/23/2016    . meperidine (DEMEROL) 50 MG tablet Take 50 mg by mouth every 4 (four) hours as needed for severe  pain.    . metroNIDAZOLE (METROCREAM) 0.75 % cream APPLY 1 THIN LAYER APPLICATION TO FACE BID    . montelukast (SINGULAIR) 10 MG tablet Take 1 tablet (10 mg total) by mouth at bedtime. 30 tablet 5  . naratriptan (AMERGE) 2.5 MG tablet TK 1 T PO QD PRF HA  5  . omeprazole (PRILOSEC) 40 MG capsule Take 1 capsule (40 mg total) by mouth daily. 90 capsule 2  . polyethylene glycol-electrolytes (NULYTELY/GOLYTELY) 420 g solution MIX AND DRINK UTD  0  . Respiratory Therapy Supplies (FLUTTER) DEVI Use as directed 1 each 0  . tiZANidine (ZANAFLEX) 2 MG tablet TK 1 T PO HS PRN    . traMADol (ULTRAM) 50 MG tablet 1-2 every 4 hours as needed for cough or pain (Patient taking differently: Take 50-100 mg by mouth every 4 (four) hours as needed (pain). 1-2 every 4 hours as needed for cough or pain) 40 tablet 0  . venlafaxine XR (EFFEXOR-XR) 150 MG 24 hr capsule Take 150 mg by mouth 2 (two) times daily.     Marland Kitchen amLODipine (NORVASC) 2.5 MG tablet Take 2.5 mg by mouth daily.     . ciprofloxacin (CIPRO) 250 MG tablet TK 1 T PO BID FOR 7 DAYS    . fluconazole (DIFLUCAN) 150 MG tablet TK 1 T PO ONCE AS NEEDED FOR YEAST REPEAT AT THE END OF THE ANTIBIOTICS    . minocycline (DYNACIN) 100 MG tablet TK 1 T PO D    . mometasone-formoterol (DULERA)  200-5 MCG/ACT AERO Inhale 2 puffs into the lungs 2 (two) times daily. 13 g 5  . promethazine (PHENERGAN) 25 MG tablet Take 25 mg by mouth every 4 (four) hours as needed for nausea or vomiting.    . valACYclovir (VALTREX) 1000 MG tablet TK 1 T PO BID     Current Facility-Administered Medications  Medication Dose Route Frequency Provider Last Rate Last Dose  . triamcinolone acetonide (KENALOG) 10 MG/ML injection 10 mg  10 mg Other Once Wallene Huh, DPM       Allergies: Allergies  Allergen Reactions  . Amoxicillin Hives  . Codeine Itching  . Penicillins Hives    Has patient had a PCN reaction causing immediate rash, facial/tongue/throat swelling, SOB or lightheadedness with hypotension: Yes Has patient had a PCN reaction causing severe rash involving mucus membranes or skin necrosis: no Has patient had a PCN reaction that required hospitalization: no Has patient had a PCN reaction occurring within the last 10 years: no If all of the above answers are "NO", then may proceed with Cephalosporin use.   . Prednisone Other (See Comments)    Severe cramps and "spasms"  . Sulfa Antibiotics Other (See Comments)    Does not remember side effects.    I reviewed her past medical history, social history, family history, and environmental history and no significant changes have been reported from previous visit on 11/03/2017.  Review of Systems  Constitutional: Negative for appetite change, chills, fever and unexpected weight change.  HENT: Negative for congestion and rhinorrhea.   Eyes: Negative for itching.  Respiratory: Negative for cough, chest tightness, shortness of breath and wheezing.   Gastrointestinal: Negative for abdominal pain.  Skin: Negative for rash.  Allergic/Immunologic: Positive for environmental allergies.  Neurological: Negative for headaches.   Objective: Physical Exam Not obtained as encounter was done via telephone.  Patient speaking in full sentences without any  respiratory distress.  Previous notes and tests were reviewed.  I discussed  the assessment and treatment plan with the patient. The patient was provided an opportunity to ask questions and all were answered. The patient agreed with the plan and demonstrated an understanding of the instructions. After visit summary/patient instructions available via mychart.   The patient was advised to call back or seek an in-person evaluation if the symptoms worsen or if the condition fails to improve as anticipated.  I provided 32 minutes of non-face-to-face time during this encounter.  It was my pleasure to participate in [place] care today. Please feel free to contact me with any questions or concerns.   Sincerely,  Rexene Alberts, DO Allergy & Immunology  [facility] office: [phone] [facility] office: [phone]

## 2019-06-29 NOTE — Assessment & Plan Note (Addendum)
Chest tightness since ran out of Singulair 3 weeks ago. No oral prednisone since last visit in Nov 2018. Today's ACT score 20.  . Daily controller medication(s): Dulera 200 2 puffs twice a day and rinse mouth afterwards. o Restart Singulair 10mg  daily at night.  . Prior to physical activity: May use albuterol rescue inhaler 2 puffs 5 to 15 minutes prior to strenuous physical activities. Marland Kitchen Rescue medications: May use albuterol rescue inhaler 2 puffs or nebulizer every 4 to 6 hours as needed for shortness of breath, chest tightness, coughing, and wheezing. Monitor frequency of use.  . During upper respiratory infections: Start Qvar 80 2 puffs twice a day for 1-2 weeks. Rinse mouth afterwards.  . Patient needs in office visit within the next 1-2 months as we need to repeat spirometry.

## 2019-06-30 LAB — CYTOLOGY - PAP
Diagnosis: NEGATIVE
HPV: NOT DETECTED

## 2019-07-14 ENCOUNTER — Ambulatory Visit (INDEPENDENT_AMBULATORY_CARE_PROVIDER_SITE_OTHER): Payer: 59

## 2019-07-14 DIAGNOSIS — J309 Allergic rhinitis, unspecified: Secondary | ICD-10-CM

## 2019-07-14 MED ORDER — MONTELUKAST SODIUM 10 MG PO TABS: 10.0000 mg | ORAL_TABLET | Freq: Every day | ORAL | 1 refills | Status: AC

## 2019-07-18 ENCOUNTER — Ambulatory Visit (INDEPENDENT_AMBULATORY_CARE_PROVIDER_SITE_OTHER): Payer: 59 | Admitting: *Deleted

## 2019-07-18 DIAGNOSIS — J309 Allergic rhinitis, unspecified: Secondary | ICD-10-CM

## 2019-07-26 ENCOUNTER — Ambulatory Visit (INDEPENDENT_AMBULATORY_CARE_PROVIDER_SITE_OTHER): Payer: 59

## 2019-07-26 DIAGNOSIS — J309 Allergic rhinitis, unspecified: Secondary | ICD-10-CM | POA: Diagnosis not present

## 2019-08-02 ENCOUNTER — Other Ambulatory Visit: Payer: Self-pay

## 2019-08-02 ENCOUNTER — Ambulatory Visit (INDEPENDENT_AMBULATORY_CARE_PROVIDER_SITE_OTHER): Payer: 59 | Admitting: Allergy

## 2019-08-02 ENCOUNTER — Encounter: Payer: Self-pay | Admitting: Allergy

## 2019-08-02 VITALS — BP 132/80 | HR 103 | Resp 18

## 2019-08-02 DIAGNOSIS — J309 Allergic rhinitis, unspecified: Secondary | ICD-10-CM | POA: Diagnosis not present

## 2019-08-02 DIAGNOSIS — T781XXD Other adverse food reactions, not elsewhere classified, subsequent encounter: Secondary | ICD-10-CM

## 2019-08-02 DIAGNOSIS — J302 Other seasonal allergic rhinitis: Secondary | ICD-10-CM

## 2019-08-02 DIAGNOSIS — J3089 Other allergic rhinitis: Secondary | ICD-10-CM

## 2019-08-02 DIAGNOSIS — T781XXA Other adverse food reactions, not elsewhere classified, initial encounter: Secondary | ICD-10-CM | POA: Insufficient documentation

## 2019-08-02 DIAGNOSIS — J4540 Moderate persistent asthma, uncomplicated: Secondary | ICD-10-CM | POA: Diagnosis not present

## 2019-08-02 DIAGNOSIS — K219 Gastro-esophageal reflux disease without esophagitis: Secondary | ICD-10-CM | POA: Insufficient documentation

## 2019-08-02 NOTE — Patient Instructions (Addendum)
Asthma  Daily controller medication(s):Dulera 200 2 puffs twice a day with spacer and rinse mouth afterwards. Spacer given.  ? Continue Singulair 10mg  daily at night.   Prior to physical activity:May use albuterol rescue inhaler 2 puffs 5 to 15 minutes prior to strenuous physical activities.  Rescue medications:May use albuterol rescue inhaler 2 puffs or nebulizer every 4 to 6 hours as needed for shortness of breath, chest tightness, coughing, and wheezing. Monitor frequency of use.   During upper respiratory infections: Start Qvar 80 2 puffs twice a day for 1-2 weeks. Rinse mouth afterwards.  Asthma control goals:  Full participation in all desired activities (may need albuterol before activity) Albuterol use two times or less a week on average (not counting use with activity) Cough interfering with sleep two times or less a month Oral steroids no more than once a year No hospitalizations  Let me know in 1 week if not doing better. Then will start on Spiriva.   Seasonal and perennial allergic rhinitis  Continue allergy injections every 2 weeks.   Continue environmental control measures.   May use over the counter antihistamines such as Zyrtec (cetirizine), Claritin (loratadine), Allegra (fexofenadine), or Xyzal (levocetirizine) daily as needed.  Continue Singulair 10mg  daily at night.   Food  Avoid marinara sauce and noodles for now.  Monitor symptoms and if you have another episode write down what you had eaten within the last 4-6 hours.  Follow up in 2 months for skin testing and check up. Do not take Claritin, zyrtec or any other types of antihistamines for 3 days before that appointment. Okay to take Singulair and your inhalers.

## 2019-08-02 NOTE — Assessment & Plan Note (Signed)
Breathing is still not back to baseline but only started Singulair 3 days ago due to insurance issues.   Today's spirometry showed mild restriction.   Daily controller medication(s):Dulera 200 2 puffs twice a day with spacer and rinse mouth afterwards. Spacer given.  ? Continue Singulair 10mg  daily at night.   Prior to physical activity:May use albuterol rescue inhaler 2 puffs 5 to 15 minutes prior to strenuous physical activities.  Rescue medications:May use albuterol rescue inhaler 2 puffs or nebulizer every 4 to 6 hours as needed for shortness of breath, chest tightness, coughing, and wheezing. Monitor frequency of use.   During upper respiratory infections: Start Qvar 80 2 puffs twice a day for 1-2 weeks. Rinse mouth afterwards.   If not improved with Singulair in 1-2 weeks then will add on Spiriva.

## 2019-08-02 NOTE — Assessment & Plan Note (Addendum)
Past history - on AIT: G/Weeds/T + M/DM/C/CR since 07/02/2016. Nasal sprays cause epistaxis. Patient has dry eyes.  Interim history - increased sneezing lately. No issues with AIT.   Continue allergy injections every 2 weeks.   Continue environmental control measures.   May use over the counter antihistamines such as Zyrtec (cetirizine), Claritin (loratadine), Allegra (fexofenadine), or Xyzal (levocetirizine) daily as needed.  Continue Singulair 10mg  daily at night.   Declines nasal sprays.

## 2019-08-02 NOTE — Progress Notes (Signed)
Follow Up Note  RE: Brittany Riggs MRN: [id number] DOB: 10-Dec-1971 Date of Office Visit: 08/02/2019  Referring provider: [name], MD Primary care provider: [name], MD  Chief Complaint: Asthma (breathing issues) and Allergic Reaction (Tomatoe salsa )  History of Present Illness: I had the pleasure of seeing Brittany Riggs for a follow up visit at the [facility] [place] [facility] on 08/02/2019. She is a 48 y.o. female, who is being followed for asthma and allergic rhinitis. Today she is here for regular follow up visit. Her previous allergy office visit was on 06/29/2019 with Dr. [name] via telemedicine.  Asthma Restarted Singulair 3 days ago and not sure if it's helping yet. There was some issue with her insurance thus the reason of why she was not able to restart it sooner. Still taking Dulera 200 2 puffs twice a day. No spacer at home.  Having issues with chest tightness, coughing and SOB on a daily basis.  Used albuterol 2-3 times since the last visit. All those times were during exertion. Denies any ER/urgent care visits or prednisone use since the last visit. Denies any fevers or chills.   Seasonal and perennial allergic rhinitis Recently started to have issues with sneezing. Tolerating allergy injections with no issues and seems to be helping. Nasal sprays cause nosebleeds. Takes Claritin in the morning and zyrtec at night.   Food Patient tolerates ketchup and tomatoes with no issues however when it's a marinara sauce it causes itching, nausea and diarrhea. Symptoms usually start within a few hours and takes about 24 hours for the symptoms to resolve.  Sometimes has issues with Lo mein noodles as well.  This has been going on for a few years.  Sesame causes itching.   Patient tolerates onions, garlic, oregano, parsley with no issues.   Past work up includes: none. Dietary History: patient has been eating other foods including milk, eggs, limited peanut,  treenuts, shellfish, seafood, soy, wheat, meats, fruits and vegetables.  She reports reading labels and avoiding tomato sauce in diet completely.  She does have GERD and takes PPI.  Assessment and Plan: Brittany Riggs is a 48 y.o. female with: Not well controlled moderate persistent asthma Breathing is still not back to baseline but only started Singulair 3 days ago due to insurance issues.   Today's spirometry showed mild restriction.   Daily controller medication(s):Dulera 200 2 puffs twice a day with spacer and rinse mouth afterwards. Spacer given.  ? Continue Singulair 10mg  daily at night.   Prior to physical activity:May use albuterol rescue inhaler 2 puffs 5 to 15 minutes prior to strenuous physical activities.  Rescue medications:May use albuterol rescue inhaler 2 puffs or nebulizer every 4 to 6 hours as needed for shortness of breath, chest tightness, coughing, and wheezing. Monitor frequency of use.   During upper respiratory infections: Start Qvar 80 2 puffs twice a day for 1-2 weeks. Rinse mouth afterwards.   If not improved with Singulair in 1-2 weeks then will add on Spiriva.   Seasonal and perennial allergic rhinitis Past history - on AIT: G/Weeds/T + M/DM/C/CR since 07/02/2016. Nasal sprays cause epistaxis. Patient has dry eyes.  Interim history - increased sneezing lately. No issues with AIT.   Continue allergy injections every 2 weeks.   Continue environmental control measures.   May use over the counter antihistamines such as Zyrtec (cetirizine), Claritin (loratadine), Allegra (fexofenadine), or Xyzal (levocetirizine) daily as needed.  Continue Singulair 10mg  daily at night.   Declines  nasal sprays.   Adverse food reaction Concerned for allergies to marinara sauce and possibly noodles. Tolerates ketchup and tomatoes. Usually has itching, nausea and diarrhea after eating marinara sauce and noodles. Sesame causes itching.  Unable to skin test today due to recent  antihistamine intake.  Avoid marinara sauce and noodles for now.  Monitor symptoms and if you have another episode write down what you had eaten within the last 4-6 hours.  Will skin test to foods - top basic foods and tomatoes, grains, sesame at next visit.   For mild symptoms you can take over the counter antihistamines such as Benadryl and monitor symptoms closely. If symptoms worsen or if you have severe symptoms including breathing issues, throat closure, significant swelling, whole body hives, severe diarrhea and vomiting, lightheadedness then seek immediate medical care.  Return in about 2 months (around 10/02/2019) for Skin testing.  Diagnostics: Spirometry:  Tracings reviewed. Her effort: Good reproducible efforts. FVC: 2.53L FEV1: 2.08L, 79% predicted FEV1/FVC ratio: 82% Interpretation: Spirometry consistent with possible restrictive disease.  Please see scanned spirometry results for details.  Medication List:  Current Outpatient Medications  Medication Sig Dispense Refill   albuterol (PROAIR HFA) 108 (90 Base) MCG/ACT inhaler Inhale 2 puffs into the lungs every 6 (six) hours as needed for wheezing or shortness of breath. 8 g 1   amLODipine (NORVASC) 2.5 MG tablet Take 2.5 mg by mouth daily.      beclomethasone (QVAR REDIHALER) 80 MCG/ACT inhaler Inhale 2 puffs into the lungs 2 (two) times daily as needed (asthma). 10.6 g 3   Beclomethasone Dipropionate (QNASL) 80 MCG/ACT AERS Place 2 sprays into the nose daily as needed (allergies).     carvedilol (COREG) 3.125 MG tablet TK 1 T PO BID     chlorpheniramine (CHLOR-TRIMETON) 4 MG tablet Take 4 mg by mouth daily as needed for allergies.      chlorthalidone (HYGROTON) 25 MG tablet TK 1 T PO QD IN THE MORNING WF  5   Cholecalciferol (VITAMIN D3) 5000 units TABS Take 5,000 Units by mouth daily.      ciprofloxacin (CIPRO) 250 MG tablet TK 1 T PO BID FOR 7 DAYS     cyclobenzaprine (FLEXERIL) 10 MG tablet Take 1 tablet  (10 mg total) by mouth 3 (three) times daily as needed for muscle spasms. 10 tablet 0   erythromycin with ethanol (EMGEL) 2 % gel APP EXT AA BID     Eszopiclone 3 MG TABS Take 3 mg by mouth at bedtime as needed (sleep). Take immediately before bedtime      famotidine (PEPCID) 20 MG tablet Take 20 mg by mouth daily as needed for heartburn. Reported on 06/23/2016     fluconazole (DIFLUCAN) 150 MG tablet TK 1 T PO ONCE AS NEEDED FOR YEAST REPEAT AT THE END OF THE ANTIBIOTICS     fluticasone (CUTIVATE) 0.05 % cream APPLY 1 APPLICATION TO FACE BID FOR 1 WEEK     hydrochlorothiazide (HYDRODIURIL) 25 MG tablet Take 25 mg by mouth daily.     hydrocortisone 2.5 % cream APPLY TOPICALLY BID FOR 14 DAYS     hydroxychloroquine (PLAQUENIL) 200 MG tablet Take 400 mg by mouth daily.     irbesartan (AVAPRO) 300 MG tablet Take 300 mg by mouth daily.     levonorgestrel-ethinyl estradiol (SEASONALE,INTROVALE,JOLESSA) 0.15-0.03 MG tablet Take 1 tablet by mouth daily.     Lifitegrast (XIIDRA) 5 % SOLN Place 1 drop into both eyes 2 (two) times daily.  loratadine (CLARITIN) 10 MG tablet Take 10 mg by mouth daily. Reported on 06/23/2016     meperidine (DEMEROL) 50 MG tablet Take 50 mg by mouth every 4 (four) hours as needed for severe pain.     metroNIDAZOLE (METROCREAM) 0.75 % cream APPLY 1 THIN LAYER APPLICATION TO FACE BID     minocycline (DYNACIN) 100 MG tablet TK 1 T PO D     mometasone-formoterol (DULERA) 200-5 MCG/ACT AERO Inhale 2 puffs into the lungs 2 (two) times daily. 13 g 5   montelukast (SINGULAIR) 10 MG tablet Take 1 tablet (10 mg total) by mouth at bedtime. 90 tablet 1   naratriptan (AMERGE) 2.5 MG tablet TK 1 T PO QD PRF HA  5   omeprazole (PRILOSEC) 40 MG capsule Take 1 capsule (40 mg total) by mouth daily. 90 capsule 2   polyethylene glycol-electrolytes (NULYTELY/GOLYTELY) 420 g solution MIX AND DRINK UTD  0   promethazine (PHENERGAN) 25 MG tablet Take 25 mg by mouth every 4  (four) hours as needed for nausea or vomiting.     Respiratory Therapy Supplies (FLUTTER) DEVI Use as directed 1 each 0   tiZANidine (ZANAFLEX) 2 MG tablet TK 1 T PO HS PRN     traMADol (ULTRAM) 50 MG tablet 1-2 every 4 hours as needed for cough or pain (Patient taking differently: Take 50-100 mg by mouth every 4 (four) hours as needed (pain). 1-2 every 4 hours as needed for cough or pain) 40 tablet 0   valACYclovir (VALTREX) 1000 MG tablet TK 1 T PO BID     venlafaxine XR (EFFEXOR-XR) 150 MG 24 hr capsule Take 150 mg by mouth 2 (two) times daily.      Current Facility-Administered Medications  Medication Dose Route Frequency Provider Last Rate Last Dose   triamcinolone acetonide (KENALOG) 10 MG/ML injection 10 mg  10 mg Other Once Wallene Huh, DPM       Allergies: Allergies  Allergen Reactions   Amoxicillin Hives   Codeine Itching   Penicillins Hives    Has patient had a PCN reaction causing immediate rash, facial/tongue/throat swelling, SOB or lightheadedness with hypotension: Yes Has patient had a PCN reaction causing severe rash involving mucus membranes or skin necrosis: no Has patient had a PCN reaction that required hospitalization: no Has patient had a PCN reaction occurring within the last 10 years: no If all of the above answers are "NO", then may proceed with Cephalosporin use.    Prednisone Other (See Comments)    Severe cramps and "spasms"   Sulfa Antibiotics Other (See Comments)    Does not remember side effects.    I reviewed her past medical history, social history, family history, and environmental history and no significant changes have been reported from previous visit on 06/29/2019.  Review of Systems  Constitutional: Negative for appetite change, chills, fever and unexpected weight change.  HENT: Positive for sneezing. Negative for congestion and rhinorrhea.   Eyes: Negative for itching.  Respiratory: Positive for cough, chest tightness and  shortness of breath. Negative for wheezing.   Gastrointestinal: Negative for abdominal pain.  Skin: Negative for rash.  Allergic/Immunologic: Positive for environmental allergies.  Neurological: Negative for headaches.   Objective: BP 132/80 (BP Location: Left Arm, Patient Position: Sitting, Cuff Size: Large)    Pulse (!) 103    Resp 18    SpO2 99%  There is no height or weight on file to calculate BMI. Physical Exam  Constitutional: She is oriented  to person, place, and time. She appears well-developed and well-nourished.  HENT:  Head: Normocephalic and atraumatic.  Right Ear: External ear normal.  Left Ear: External ear normal.  Nose: Nose normal.  Mouth/Throat: Oropharynx is clear and moist.  Eyes: Conjunctivae and EOM are normal.  Neck: Neck supple.  Cardiovascular: Normal rate, regular rhythm and normal heart sounds. Exam reveals no gallop and no friction rub.  No murmur heard. Pulmonary/Chest: Effort normal and breath sounds normal. She has no wheezes. She has no rales.  Neurological: She is alert and oriented to person, place, and time.  Skin: Skin is warm. No rash noted.  Psychiatric: She has a normal mood and affect. Her behavior is normal.  Nursing note and vitals reviewed.  Previous notes and tests were reviewed. The plan was reviewed with the patient/family, and all questions/concerned were addressed.  It was my pleasure to see Brittany Riggs today and participate in her care. Please feel free to contact me with any questions or concerns.  Sincerely,  Rexene Alberts, DO Allergy & Immunology  [facility] office: [phone] [facility] office: [place] office: [phone]

## 2019-08-02 NOTE — Assessment & Plan Note (Addendum)
Concerned for allergies to marinara sauce and possibly noodles. Tolerates ketchup and tomatoes. Usually has itching, nausea and diarrhea after eating marinara sauce and noodles. Sesame causes itching.  Unable to skin test today due to recent antihistamine intake.  Avoid marinara sauce and noodles for now.  Monitor symptoms and if you have another episode write down what you had eaten within the last 4-6 hours.  Will skin test to foods - top basic foods and tomatoes, grains, sesame at next visit.   For mild symptoms you can take over the counter antihistamines such as Benadryl and monitor symptoms closely. If symptoms worsen or if you have severe symptoms including breathing issues, throat closure, significant swelling, whole body hives, severe diarrhea and vomiting, lightheadedness then seek immediate medical care.

## 2019-08-02 NOTE — Assessment & Plan Note (Signed)
>>  ASSESSMENT AND PLAN FOR NOT WELL CONTROLLED MODERATE PERSISTENT ASTHMA WRITTEN ON 08/02/2019  1:01 PM BY Ellamae Sia, DO  Breathing is still not back to baseline but only started Singulair 3 days ago due to insurance issues.  Today's spirometry showed mild restriction.  Daily controller medication(s):Dulera 200 2 puffs twice a day with spacer and rinse mouth afterwards. Spacer given.  Continue Singulair 10mg  daily at night.  Prior to physical activity:May use albuterol rescue inhaler 2 puffs 5 to 15 minutes prior to strenuous physical activities. Rescue medications:May use albuterol rescue inhaler 2 puffs or nebulizer every 4 to 6 hours as needed for shortness of breath, chest tightness, coughing, and wheezing. Monitor frequency of use.  During upper respiratory infections: Start Qvar 80 2 puffs twice a day for 1-2 weeks. Rinse mouth afterwards.  If not improved with Singulair in 1-2 weeks then will add on Spiriva.

## 2019-08-09 ENCOUNTER — Ambulatory Visit (INDEPENDENT_AMBULATORY_CARE_PROVIDER_SITE_OTHER): Payer: 59 | Admitting: *Deleted

## 2019-08-09 DIAGNOSIS — J309 Allergic rhinitis, unspecified: Secondary | ICD-10-CM

## 2019-08-23 ENCOUNTER — Telehealth: Payer: Self-pay | Admitting: *Deleted

## 2019-08-23 ENCOUNTER — Ambulatory Visit (INDEPENDENT_AMBULATORY_CARE_PROVIDER_SITE_OTHER): Payer: 59 | Admitting: *Deleted

## 2019-08-23 DIAGNOSIS — J309 Allergic rhinitis, unspecified: Secondary | ICD-10-CM

## 2019-08-23 NOTE — Telephone Encounter (Signed)
Okay to stop and see if the sleepiness goes away. If she gets worsening asthma or allergy symptoms let us know.

## 2019-08-23 NOTE — Telephone Encounter (Signed)
Informed patient of plan and she agreed with plan.

## 2019-08-23 NOTE — Telephone Encounter (Signed)
Patient states she feels like Singulair makes her extremely sleepy. She wants to know can she cut the dose in half or stop taking it all together. Please advise.

## 2019-09-04 ENCOUNTER — Ambulatory Visit (INDEPENDENT_AMBULATORY_CARE_PROVIDER_SITE_OTHER): Payer: 59

## 2019-09-04 DIAGNOSIS — J309 Allergic rhinitis, unspecified: Secondary | ICD-10-CM

## 2019-09-04 MED ORDER — EPINEPHRINE 0.3 MG/0.3ML IJ SOAJ: 0.3000 mg | Freq: Once | INTRAMUSCULAR | 2 refills | Status: AC

## 2019-09-22 ENCOUNTER — Ambulatory Visit (INDEPENDENT_AMBULATORY_CARE_PROVIDER_SITE_OTHER): Payer: 59 | Admitting: *Deleted

## 2019-09-22 DIAGNOSIS — J309 Allergic rhinitis, unspecified: Secondary | ICD-10-CM | POA: Diagnosis not present

## 2019-10-04 ENCOUNTER — Ambulatory Visit: Payer: 59 | Admitting: Allergy

## 2019-10-12 ENCOUNTER — Other Ambulatory Visit: Payer: Self-pay | Admitting: Family Medicine

## 2019-10-12 ENCOUNTER — Ambulatory Visit (INDEPENDENT_AMBULATORY_CARE_PROVIDER_SITE_OTHER): Payer: 59 | Admitting: *Deleted

## 2019-10-12 DIAGNOSIS — M25511 Pain in right shoulder: Secondary | ICD-10-CM

## 2019-10-12 DIAGNOSIS — M25512 Pain in left shoulder: Secondary | ICD-10-CM

## 2019-10-12 DIAGNOSIS — J309 Allergic rhinitis, unspecified: Secondary | ICD-10-CM

## 2019-10-23 ENCOUNTER — Other Ambulatory Visit: Payer: Self-pay

## 2019-10-23 ENCOUNTER — Ambulatory Visit: Payer: 59 | Admitting: Podiatry

## 2019-10-23 ENCOUNTER — Encounter: Payer: Self-pay | Admitting: Podiatry

## 2019-10-23 DIAGNOSIS — M722 Plantar fascial fibromatosis: Secondary | ICD-10-CM

## 2019-10-23 DIAGNOSIS — M779 Enthesopathy, unspecified: Secondary | ICD-10-CM | POA: Diagnosis not present

## 2019-10-26 ENCOUNTER — Other Ambulatory Visit: Payer: Self-pay | Admitting: Family Medicine

## 2019-10-26 DIAGNOSIS — N946 Dysmenorrhea, unspecified: Secondary | ICD-10-CM

## 2019-10-27 ENCOUNTER — Other Ambulatory Visit: Payer: Self-pay | Admitting: Family Medicine

## 2019-10-27 DIAGNOSIS — N946 Dysmenorrhea, unspecified: Secondary | ICD-10-CM

## 2019-10-30 NOTE — Progress Notes (Signed)
Subjective:   Patient ID: Brittany Riggs, female   DOB: 48 y.o.   MRN: [id number]   HPI Patient states both feet hurting again with the right ankle hurting on the outside and the left on the outside of the heel bothering her.  States that she also needs new handicap sticker   ROS      Objective:  Physical Exam  Neurovascular status intact with inflammation pain of the sinus tarsi right lateral foot left with obesity is complicating factor     Assessment:  H&P and explained condition to patient and the capsulitis fasciitis present along with obesity     Plan:  Sterile prep done and injected the sinus tarsi right 3 mg Kenalog 5 mg Xylocaine and injected the lateral heel 3 mg Dexasone Kenalog 5 mg Xylocaine and advised on reduced activity

## 2019-11-01 ENCOUNTER — Other Ambulatory Visit: Payer: 59

## 2019-11-03 ENCOUNTER — Ambulatory Visit (INDEPENDENT_AMBULATORY_CARE_PROVIDER_SITE_OTHER): Payer: 59

## 2019-11-03 ENCOUNTER — Other Ambulatory Visit: Payer: 59

## 2019-11-03 DIAGNOSIS — J309 Allergic rhinitis, unspecified: Secondary | ICD-10-CM | POA: Diagnosis not present

## 2019-11-14 NOTE — Progress Notes (Signed)
VIALS EXP 11-14-20 

## 2019-11-15 ENCOUNTER — Other Ambulatory Visit: Payer: 59

## 2019-11-16 DIAGNOSIS — J301 Allergic rhinitis due to pollen: Secondary | ICD-10-CM | POA: Diagnosis not present

## 2019-11-28 ENCOUNTER — Ambulatory Visit (INDEPENDENT_AMBULATORY_CARE_PROVIDER_SITE_OTHER): Payer: 59

## 2019-11-28 DIAGNOSIS — J309 Allergic rhinitis, unspecified: Secondary | ICD-10-CM | POA: Diagnosis not present

## 2019-12-04 ENCOUNTER — Ambulatory Visit: Payer: 59 | Admitting: Podiatry

## 2019-12-25 ENCOUNTER — Other Ambulatory Visit: Payer: 59

## 2019-12-27 ENCOUNTER — Other Ambulatory Visit: Payer: Self-pay | Admitting: Family Medicine

## 2019-12-27 ENCOUNTER — Ambulatory Visit (INDEPENDENT_AMBULATORY_CARE_PROVIDER_SITE_OTHER): Payer: 59 | Admitting: *Deleted

## 2019-12-27 DIAGNOSIS — J309 Allergic rhinitis, unspecified: Secondary | ICD-10-CM

## 2019-12-29 ENCOUNTER — Ambulatory Visit
Admission: RE | Admit: 2019-12-29 | Discharge: 2019-12-29 | Disposition: A | Discharge status: Home / Self Care | Payer: 59 | Source: Ambulatory Visit | Attending: Family Medicine | Admitting: Family Medicine

## 2019-12-29 ENCOUNTER — Other Ambulatory Visit: Payer: Self-pay

## 2019-12-29 DIAGNOSIS — M25511 Pain in right shoulder: Secondary | ICD-10-CM

## 2019-12-29 DIAGNOSIS — M25512 Pain in left shoulder: Secondary | ICD-10-CM

## 2020-01-04 ENCOUNTER — Ambulatory Visit (INDEPENDENT_AMBULATORY_CARE_PROVIDER_SITE_OTHER): Payer: 59

## 2020-01-04 DIAGNOSIS — J309 Allergic rhinitis, unspecified: Secondary | ICD-10-CM

## 2020-01-12 ENCOUNTER — Ambulatory Visit (INDEPENDENT_AMBULATORY_CARE_PROVIDER_SITE_OTHER): Payer: 59 | Admitting: *Deleted

## 2020-01-12 ENCOUNTER — Other Ambulatory Visit: Payer: 59

## 2020-01-12 DIAGNOSIS — J309 Allergic rhinitis, unspecified: Secondary | ICD-10-CM | POA: Diagnosis not present

## 2020-01-18 ENCOUNTER — Ambulatory Visit: Payer: 59 | Attending: Internal Medicine

## 2020-01-18 ENCOUNTER — Ambulatory Visit (INDEPENDENT_AMBULATORY_CARE_PROVIDER_SITE_OTHER): Payer: 59

## 2020-01-18 DIAGNOSIS — Z20822 Contact with and (suspected) exposure to covid-19: Secondary | ICD-10-CM

## 2020-01-18 DIAGNOSIS — J309 Allergic rhinitis, unspecified: Secondary | ICD-10-CM

## 2020-01-19 ENCOUNTER — Telehealth: Payer: Self-pay | Admitting: Podiatry

## 2020-01-19 LAB — NOVEL CORONAVIRUS, NAA: SARS-CoV-2, NAA: NOT DETECTED

## 2020-01-19 NOTE — Telephone Encounter (Signed)
Patient needs accommodation forms completed. She has a modified work schedule that ends 02/01/2020, working 6hr shifts.  With no Physical lifting, pushing or pulling . Please advise as to completing forms.

## 2020-01-22 NOTE — Telephone Encounter (Signed)
That's fine to continue with 6 hour days

## 2020-01-24 NOTE — Telephone Encounter (Signed)
See Dr. Paulla Dolly response. Thanks

## 2020-02-06 ENCOUNTER — Ambulatory Visit (INDEPENDENT_AMBULATORY_CARE_PROVIDER_SITE_OTHER): Payer: 59

## 2020-02-06 DIAGNOSIS — J309 Allergic rhinitis, unspecified: Secondary | ICD-10-CM | POA: Diagnosis not present

## 2020-02-19 ENCOUNTER — Ambulatory Visit: Payer: Self-pay | Attending: Internal Medicine

## 2020-02-19 DIAGNOSIS — Z23 Encounter for immunization: Secondary | ICD-10-CM | POA: Insufficient documentation

## 2020-02-19 NOTE — Progress Notes (Signed)
   Covid-19 Vaccination Clinic  Name:  Brittany Riggs    MRN: [id number] DOB: 28-Oct-1971  02/19/2020  [name] was observed post Covid-19 immunization for 15 minutes without incident. She was provided with Vaccine Information Sheet and instruction to access the V-Safe system.   [name] was instructed to call 911 with any severe reactions post vaccine: Marland Kitchen Difficulty breathing  . Swelling of face and throat  . A fast heartbeat  . A bad rash all over body  . Dizziness and weakness   Immunizations Administered    Name Date Dose VIS Date Route   Pfizer COVID-19 Vaccine 02/19/2020  3:53 PM 0.3 mL 11/24/2019 Intramuscular   Manufacturer: [place]   Lot: [id number]   [place]: [id number]

## 2020-02-23 ENCOUNTER — Telehealth: Payer: Self-pay | Admitting: Podiatry

## 2020-02-23 NOTE — Telephone Encounter (Signed)
Patient needs accommodation forms completed. She has a modified work schedule that ends 03/13/2020, working 6hr shifts.  With no Physical lifting, pushing or pulling of more than 50lbs, she would like to have it reinstated for 62months.  Please advise as to completing forms.

## 2020-02-26 ENCOUNTER — Ambulatory Visit (INDEPENDENT_AMBULATORY_CARE_PROVIDER_SITE_OTHER): Payer: 59

## 2020-02-26 DIAGNOSIS — J309 Allergic rhinitis, unspecified: Secondary | ICD-10-CM

## 2020-03-05 ENCOUNTER — Telehealth: Payer: Self-pay

## 2020-03-05 ENCOUNTER — Ambulatory Visit (INDEPENDENT_AMBULATORY_CARE_PROVIDER_SITE_OTHER): Payer: 59

## 2020-03-05 DIAGNOSIS — J309 Allergic rhinitis, unspecified: Secondary | ICD-10-CM | POA: Diagnosis not present

## 2020-03-05 NOTE — Telephone Encounter (Signed)
Please call patient and have her schedule appointment with me. She is overdue for her regular visit as well. Can you ask her which vaccine she got? Pfizer or [employer]?  Hold off getting the second vaccine for now.   Thank you.

## 2020-03-05 NOTE — Telephone Encounter (Signed)
Called and spoke with patient, she has been placed on the schedule for tomorrow for a televisit with Dr. [name].

## 2020-03-05 NOTE — Telephone Encounter (Signed)
Patient received her first COVID vaccine on 02-19-20. Within seconds her itching in her throat began, then tingling, then her throat swelled. Denies other symptoms. She was given Benadryl at the [facility] where she received her vaccine. Went home and took some more Benadryl because she felt scratchy in her throat. Please advise on further instructions and thank you.

## 2020-03-05 NOTE — Telephone Encounter (Signed)
Called patient and she stated it was [employer] vaccine. I informed her that you wanted an appointment but she stated since she took the benadryl and was fine she didn't think it was necessary to come in for an appointment. Patient would rather it be a virtual visit. Is it ok to schedule one? She would like to get this taken care of before her next dose on April 7th.

## 2020-03-05 NOTE — Telephone Encounter (Signed)
Yes, schedule for televisit.

## 2020-03-06 ENCOUNTER — Telehealth: Payer: Self-pay | Admitting: Allergy

## 2020-03-06 ENCOUNTER — Ambulatory Visit (INDEPENDENT_AMBULATORY_CARE_PROVIDER_SITE_OTHER): Payer: 59 | Admitting: Allergy

## 2020-03-06 ENCOUNTER — Other Ambulatory Visit: Payer: Self-pay

## 2020-03-06 ENCOUNTER — Encounter: Payer: Self-pay | Admitting: Allergy

## 2020-03-06 DIAGNOSIS — J4540 Moderate persistent asthma, uncomplicated: Secondary | ICD-10-CM

## 2020-03-06 DIAGNOSIS — T50Z95D Adverse effect of other vaccines and biological substances, subsequent encounter: Secondary | ICD-10-CM | POA: Diagnosis not present

## 2020-03-06 DIAGNOSIS — T50Z95A Adverse effect of other vaccines and biological substances, initial encounter: Secondary | ICD-10-CM | POA: Insufficient documentation

## 2020-03-06 DIAGNOSIS — J3089 Other allergic rhinitis: Secondary | ICD-10-CM

## 2020-03-06 DIAGNOSIS — J302 Other seasonal allergic rhinitis: Secondary | ICD-10-CM

## 2020-03-06 DIAGNOSIS — T781XXD Other adverse food reactions, not elsewhere classified, subsequent encounter: Secondary | ICD-10-CM

## 2020-03-06 NOTE — Telephone Encounter (Signed)
Please call patient back.  I have her scheduled on 4/8 at 9AM with Dr. Ernst Bowler in the [facility] office as of now.  Plan on being here for 3 hours.  No antihistamines for 3 days. No allergy injection can be given on the day of testing.  She needs to reschedule her covid-19 vaccine scheduled on 4/7.   Thank you.

## 2020-03-06 NOTE — Telephone Encounter (Signed)
Called and left a detailed message informing patient of this information but also informed patient to call the office for [place] to still go over with her.

## 2020-03-06 NOTE — Assessment & Plan Note (Signed)
Past history - on AIT: G/Weeds/T + M/DM/C/CR since 07/02/2016. Nasal sprays cause epistaxis. Patient has dry eyes.  Interim history - some increased symptoms with the pollen.   Continue allergy injections every 2 weeks.   Continue environmental control measures.  May use over the counter antihistamines such as Zyrtec (cetirizine), Claritin (loratadine), Allegra (fexofenadine), or Xyzal (levocetirizine) daily as needed.  Continue Singulair 10mg  daily at night.

## 2020-03-06 NOTE — Assessment & Plan Note (Signed)
Past history - Concerned for allergies to marinara sauce and possibly noodles. Tolerates ketchup and tomatoes. Usually has itching, nausea and diarrhea after eating marinara sauce and noodles. Sesame causes itching. Interim history - No reactions since last visit.   Unable to skin test today due to recent antihistamine intake.  Avoid marinara sauce, sesame for now.   Monitor symptoms and if you have another episode write down what you had eaten within the last 4-6 hours.  For mild symptoms you can take over the counter antihistamines such as Benadryl and monitor symptoms closely. If symptoms worsen or if you have severe symptoms including breathing issues, throat closure, significant swelling, whole body hives, severe diarrhea and vomiting, lightheadedness then seek immediate medical care.  Will skin test to foods - top basic foods and tomatoes, grains, sesame at next visit.

## 2020-03-06 NOTE — Patient Instructions (Addendum)
Vaccine reaction  Based on your history, concerning that you had an allergic reaction to the pfizer covid-19 vaccine.  Recommend covid-19 vaccine component testing first and if negative we can discuss premedication before receiving the second injection.   Asthma:   Daily controller medication(s):Dulera 200 2 puffs twice a day with spacer and rinse mouth afterwards.  ? Continue Singulair 10mg  daily at night.   Prior to physical activity:May use albuterol rescue inhaler 2 puffs 5 to 15 minutes prior to strenuous physical activities.  Rescue medications:May use albuterol rescue inhaler 2 puffs or nebulizer every 4 to 6 hours as needed for shortness of breath, chest tightness, coughing, and wheezing. Monitor frequency of use.   During upper respiratory infections: Start Qvar 80 2 puffs twice a day for 1-2 weeks. Rinse mouth afterwards. Asthma control goals:  Full participation in all desired activities (may need albuterol before activity) Albuterol use two times or less a week on average (not counting use with activity) Cough interfering with sleep two times or less a month Oral steroids no more than once a year No hospitalizations  Seasonal and perennial allergic rhinitis  Past history - on AIT: G/Weeds/T + M/DM/C/CR since 07/02/2016.   Continue allergy injections every 2 weeks.   Continue environmental control measures.  May use over the counter antihistamines such as Zyrtec (cetirizine), Claritin (loratadine), Allegra (fexofenadine), or Xyzal (levocetirizine) daily as needed.  Continue Singulair 10mg  daily at night.  Adverse food reaction  Avoid marinara sauce, sesame for now.   Monitor symptoms and if you have another episode write down what you had eaten within the last 4-6 hours.  For mild symptoms you can take over the counter antihistamines such as Benadryl and monitor symptoms closely. If symptoms worsen or if you have severe symptoms including breathing issues,  throat closure, significant swelling, whole body hives, severe diarrhea and vomiting, lightheadedness then seek immediate medical care.  Follow up for COVID-19 vaccine component testing. You must stop zyrtec for 3 days before. Okay to take inhalers and Singulair.

## 2020-03-06 NOTE — Progress Notes (Signed)
RE: Brittany Riggs MRN: [id number] DOB: 1971/08/19 Date of Telemedicine Visit: 03/06/2020  Referring provider: [name], MD Primary care provider: [name], MD  Chief Complaint: Allergic Reaction (covid vaccine - itchi throat and swelling)   Telemedicine Follow Up Visit via Telephone: I connected with Brittany Riggs for a follow up on 03/06/20 by telephone and verified that I am speaking with the correct person using two identifiers.   I discussed the limitations, risks, security and privacy concerns of performing an evaluation and management service by telephone and the availability of in person appointments. I also discussed with the patient that there may be a patient responsible charge related to this service. The patient expressed understanding and agreed to proceed.  Patient is at home/work.  Provider is at the office.  Visit start time: 10:18AM Visit end time: 10:51AM Insurance consent/check in by: front desk Medical consent and medical assistant/nurse: Lamount Cohen.  History of Present Illness: She is a 49 y.o. female, who is being followed for asthma, allergic rhinitis and adverse food reaction. Her previous allergy office visit was on 08/02/2019 with Dr. [name]. Today is a new complaint visit of Reaction to vaccine. Failed to follow up as recommended.   [place] [facility] She noticed throat pruritus almost immediately after the injection and felt throat swelling. Apparently someone checked her lymph nodes and told they were swollen but she can't recall if someone look in her mouth or if vitals were taken.   She was given benadryl and watched for about 45 minutes.   Patient came home and she took some more benadryl at night as she was still having itchy throat and difficulty swallowing. She also had some pruritus at the site of injection.  The following day, patient was feeling well.   No Covid-19 diagnosis  previously.   Any known reactions to polyethylene glycol or polysorbate?   Patient works with polysorbate. She gets some pruritus when comes in contact with it.   Any history of reactions to injectable medications?  Sometimes patient gets large localized swelling after flu vaccines for 3-4 days. This does not occur after every flu vaccine.   Any history of anaphylaxis to colonoscopy preps (i.e.Miralax)?  No issues with miralax.   Any history of dermal filler treatments in the last year?  No  Patient is scheduled for April 7th for her next injection and really wants to receive it as there has been outbreaks at her work and knows people who passed away from [place].  Asthma: Some flare with the pollen. Currently on Dulera 200 2 puffs BID and Singulair daily. The last week added on Qvar 80 2 puffs BID. Denies any ER/urgent care visits or prednisone use since the last visit.   Seasonal and perennial allergic rhinitis Receiving injections every 2 weeks with localized pruritus. She did not have her injection around the COVID-19 vaccine.   Adverse food reaction Avoiding sesame and marinara. No reactions.   Assessment and Plan: Brittany Riggs is a 49 y.o. female with: Immunization reaction Developed throat pruritus and felt throat swelling with difficulty swallowing after receiving Pfizer COVID-19 vaccine on 02/19/20. Treated with benadryl and felt better the following day. She works with polysorbates and has noted some itching when coming in contact with the material. Some large localized swelling after flu vaccines. Tolerates miralax.   Discussed with patient that based on clinical history I'm concerned that she had an IgE mediated reaction to the  pfizer Covid-19 vaccine.  Recommend Covid-19 vaccine component testing and if negative then will discuss a premedication regimen before the second vaccine.  Not well controlled moderate persistent asthma  Symptoms flared with pollen and started on  Qvar last week.  Daily controller medication(s):Dulera 200 2 puffs twice a day with spacer and rinse mouth afterwards.  ? Continue Singulair 10mg  daily at night.   Prior to physical activity:May use albuterol rescue inhaler 2 puffs 5 to 15 minutes prior to strenuous physical activities.  Rescue medications:May use albuterol rescue inhaler 2 puffs or nebulizer every 4 to 6 hours as needed for shortness of breath, chest tightness, coughing, and wheezing. Monitor frequency of use.   During upper respiratory infections: Start Qvar 80 2 puffs twice a day for 1-2 weeks. Rinse mouth afterwards.  Get spirometry at next visit. Consider adding Spiriva next.   Seasonal and perennial allergic rhinitis Past history - on AIT: G/Weeds/T + M/DM/C/CR since 07/02/2016. Nasal sprays cause epistaxis. Patient has dry eyes.  Interim history - some increased symptoms with the pollen.   Continue allergy injections every 2 weeks.   Continue environmental control measures.  May use over the counter antihistamines such as Zyrtec (cetirizine), Claritin (loratadine), Allegra (fexofenadine), or Xyzal (levocetirizine) daily as needed.  Continue Singulair 10mg  daily at night.  Adverse food reaction Past history - Concerned for allergies to marinara sauce and possibly noodles. Tolerates ketchup and tomatoes. Usually has itching, nausea and diarrhea after eating marinara sauce and noodles. Sesame causes itching. Interim history - No reactions since last visit.   Unable to skin test today due to recent antihistamine intake.  Avoid marinara sauce, sesame for now.   Monitor symptoms and if you have another episode write down what you had eaten within the last 4-6 hours.  For mild symptoms you can take over the counter antihistamines such as Benadryl and monitor symptoms closely. If symptoms worsen or if you have severe symptoms including breathing issues, throat closure, significant swelling, whole body hives,  severe diarrhea and vomiting, lightheadedness then seek immediate medical care.  Will skin test to foods - top basic foods and tomatoes, grains, sesame at next visit.   Return for Skin testing.  Diagnostics: None.  Medication List:  Current Outpatient Medications  Medication Sig Dispense Refill  . albuterol (PROAIR HFA) 108 (90 Base) MCG/ACT inhaler Inhale 2 puffs into the lungs every 6 (six) hours as needed for wheezing or shortness of breath. 8 g 1  . amLODipine (NORVASC) 2.5 MG tablet Take 2.5 mg by mouth daily.     . beclomethasone (QVAR REDIHALER) 80 MCG/ACT inhaler Inhale 2 puffs into the lungs 2 (two) times daily as needed (asthma). 10.6 g 3  . Beclomethasone Dipropionate (QNASL) 80 MCG/ACT AERS Place 2 sprays into the nose daily as needed (allergies).    . carvedilol (COREG) 3.125 MG tablet TK 1 T PO BID    . chlorpheniramine (CHLOR-TRIMETON) 4 MG tablet Take 4 mg by mouth daily as needed for allergies.     . chlorthalidone (HYGROTON) 25 MG tablet TK 1 T PO QD IN THE MORNING WF  5  . Cholecalciferol (VITAMIN D3) 5000 units TABS Take 5,000 Units by mouth daily.     . ciprofloxacin (CIPRO) 250 MG tablet TK 1 T PO BID FOR 7 DAYS    . cyclobenzaprine (FLEXERIL) 10 MG tablet Take 1 tablet (10 mg total) by mouth 3 (three) times daily as needed for muscle spasms. 10 tablet 0  . erythromycin  with ethanol (EMGEL) 2 % gel APP EXT AA BID    . Eszopiclone 3 MG TABS Take 3 mg by mouth at bedtime as needed (sleep). Take immediately before bedtime     . famotidine (PEPCID) 20 MG tablet Take 20 mg by mouth daily as needed for heartburn. Reported on 06/23/2016    . fluconazole (DIFLUCAN) 150 MG tablet TK 1 T PO ONCE AS NEEDED FOR YEAST REPEAT AT THE END OF THE ANTIBIOTICS    . fluticasone (CUTIVATE) 0.05 % cream APPLY 1 APPLICATION TO FACE BID FOR 1 WEEK    . hydrochlorothiazide (HYDRODIURIL) 25 MG tablet Take 25 mg by mouth daily.    . hydrocortisone 2.5 % cream APPLY TOPICALLY BID FOR 14 DAYS     . hydroxychloroquine (PLAQUENIL) 200 MG tablet Take 400 mg by mouth daily.    . irbesartan (AVAPRO) 300 MG tablet Take 300 mg by mouth daily.    Marland Kitchen levonorgestrel-ethinyl estradiol (SEASONALE,INTROVALE,JOLESSA) 0.15-0.03 MG tablet Take 1 tablet by mouth daily.    Marland Kitchen Lifitegrast (XIIDRA) 5 % SOLN Place 1 drop into both eyes 2 (two) times daily.    Marland Kitchen loratadine (CLARITIN) 10 MG tablet Take 10 mg by mouth daily. Reported on 06/23/2016    . metroNIDAZOLE (METROCREAM) 0.75 % cream APPLY 1 THIN LAYER APPLICATION TO FACE BID    . minocycline (DYNACIN) 100 MG tablet TK 1 T PO D    . mometasone-formoterol (DULERA) 200-5 MCG/ACT AERO Inhale 2 puffs into the lungs 2 (two) times daily. 13 g 5  . montelukast (SINGULAIR) 10 MG tablet Take 1 tablet (10 mg total) by mouth at bedtime. 90 tablet 1  . naratriptan (AMERGE) 2.5 MG tablet TK 1 T PO QD PRF HA  5  . omeprazole (PRILOSEC) 40 MG capsule Take 1 capsule (40 mg total) by mouth daily. 90 capsule 2  . polyethylene glycol-electrolytes (NULYTELY/GOLYTELY) 420 g solution MIX AND DRINK UTD  0  . promethazine (PHENERGAN) 25 MG tablet Take 25 mg by mouth every 4 (four) hours as needed for nausea or vomiting.    Marland Kitchen Respiratory Therapy Supplies (FLUTTER) DEVI Use as directed 1 each 0  . tiZANidine (ZANAFLEX) 2 MG tablet TK 1 T PO HS PRN    . traMADol (ULTRAM) 50 MG tablet 1-2 every 4 hours as needed for cough or pain (Patient taking differently: Take 50-100 mg by mouth every 4 (four) hours as needed (pain). 1-2 every 4 hours as needed for cough or pain) 40 tablet 0  . valACYclovir (VALTREX) 1000 MG tablet TK 1 T PO BID    . venlafaxine XR (EFFEXOR-XR) 150 MG 24 hr capsule Take 150 mg by mouth 2 (two) times daily.      Current Facility-Administered Medications  Medication Dose Route Frequency Provider Last Rate Last Admin  . triamcinolone acetonide (KENALOG) 10 MG/ML injection 10 mg  10 mg Other Once Wallene Huh, DPM       Allergies: Allergies  Allergen  Reactions  . Amoxicillin Hives  . Codeine Itching  . Penicillins Hives    Has patient had a PCN reaction causing immediate rash, facial/tongue/throat swelling, SOB or lightheadedness with hypotension: Yes Has patient had a PCN reaction causing severe rash involving mucus membranes or skin necrosis: no Has patient had a PCN reaction that required hospitalization: no Has patient had a PCN reaction occurring within the last 10 years: no If all of the above answers are "NO", then may proceed with Cephalosporin use.   Marland Kitchen  Prednisone Other (See Comments)    Severe cramps and "spasms"  . Sulfa Antibiotics Other (See Comments)    Does not remember side effects.    I reviewed her past medical history, social history, family history, and environmental history and no significant changes have been reported from her previous visit.  Review of Systems  Constitutional: Negative for appetite change, chills, fever and unexpected weight change.  HENT: Negative for congestion, rhinorrhea and sneezing.   Eyes: Negative for itching.  Respiratory: Negative for cough, chest tightness, shortness of breath and wheezing.   Gastrointestinal: Negative for abdominal pain.  Skin: Negative for rash.  Allergic/Immunologic: Positive for environmental allergies.  Neurological: Negative for headaches.   Objective: Physical Exam Not obtained as encounter was done via telephone.   Previous notes and tests were reviewed.  I discussed the assessment and treatment plan with the patient. The patient was provided an opportunity to ask questions and all were answered. The patient agreed with the plan and demonstrated an understanding of the instructions. After visit summary/patient instructions available via mychart.   The patient was advised to call back or seek an in-person evaluation if the symptoms worsen or if the condition fails to improve as anticipated.  I provided 33 minutes of non-face-to-face time during this  encounter.  It was my pleasure to participate in [place] care today. Please feel free to contact me with any questions or concerns.   Sincerely,  Rexene Alberts, DO Allergy & Immunology  [facility] office: [phone] [facility] office: [place] office: [phone]

## 2020-03-06 NOTE — Assessment & Plan Note (Signed)
Developed throat pruritus and felt throat swelling with difficulty swallowing after receiving Pfizer COVID-19 vaccine on 02/19/20. Treated with benadryl and felt better the following day. She works with polysorbates and has noted some itching when coming in contact with the material. Some large localized swelling after flu vaccines. Tolerates miralax.   Discussed with patient that based on clinical history I'm concerned that she had an IgE mediated reaction to the [employer] vaccine.  Recommend Covid-19 vaccine component testing and if negative then will discuss a premedication regimen before the second vaccine.

## 2020-03-06 NOTE — Assessment & Plan Note (Signed)
   Symptoms flared with pollen and started on Qvar last week.  Daily controller medication(s):Dulera 200 2 puffs twice a day with spacer and rinse mouth afterwards.  ? Continue Singulair 10mg  daily at night.   Prior to physical activity:May use albuterol rescue inhaler 2 puffs 5 to 15 minutes prior to strenuous physical activities.  Rescue medications:May use albuterol rescue inhaler 2 puffs or nebulizer every 4 to 6 hours as needed for shortness of breath, chest tightness, coughing, and wheezing. Monitor frequency of use.   During upper respiratory infections: Start Qvar 80 2 puffs twice a day for 1-2 weeks. Rinse mouth afterwards.  Get spirometry at next visit. Consider adding Spiriva next.

## 2020-03-06 NOTE — Telephone Encounter (Signed)
Patient called back to schedule her COVID vaccine testing and stated that she is unable to take extended time off of work or come to the office on the dates given Of 4/1 4/15 and 4/16.  She specified that she has availability on the 4/8 but the week of the 12th she is tied into a project for work.  She would like Dr. [name] to call to discuss alternatives

## 2020-03-06 NOTE — Assessment & Plan Note (Signed)
>>  ASSESSMENT AND PLAN FOR NOT WELL CONTROLLED MODERATE PERSISTENT ASTHMA WRITTEN ON 03/06/2020  1:34 PM BY Wyline Mood M, DO  Symptoms flared with pollen and started on Qvar last week. Daily controller medication(s):Dulera 200 2 puffs twice a day with spacer and rinse mouth afterwards.  Continue Singulair 10mg  daily at night.  Prior to physical activity:May use albuterol rescue inhaler 2 puffs 5 to 15 minutes prior to strenuous physical activities. Rescue medications:May use albuterol rescue inhaler 2 puffs or nebulizer every 4 to 6 hours as needed for shortness of breath, chest tightness, coughing, and wheezing. Monitor frequency of use.  During upper respiratory infections: Start Qvar 80 2 puffs twice a day for 1-2 weeks. Rinse mouth afterwards. Get spirometry at next visit. Consider adding Spiriva next.

## 2020-03-08 NOTE — Telephone Encounter (Signed)
Patient was notified.

## 2020-03-14 ENCOUNTER — Ambulatory Visit (INDEPENDENT_AMBULATORY_CARE_PROVIDER_SITE_OTHER): Payer: 59

## 2020-03-14 DIAGNOSIS — J302 Other seasonal allergic rhinitis: Secondary | ICD-10-CM

## 2020-03-14 DIAGNOSIS — J3089 Other allergic rhinitis: Secondary | ICD-10-CM | POA: Diagnosis not present

## 2020-03-20 ENCOUNTER — Ambulatory Visit: Payer: Self-pay

## 2020-03-21 ENCOUNTER — Other Ambulatory Visit: Payer: Self-pay

## 2020-03-21 ENCOUNTER — Ambulatory Visit: Payer: 59 | Admitting: Allergy & Immunology

## 2020-03-21 ENCOUNTER — Encounter: Payer: Self-pay | Admitting: Allergy & Immunology

## 2020-03-21 VITALS — BP 142/82 | HR 115 | Temp 97.4°F | Resp 20

## 2020-03-21 DIAGNOSIS — T50Z95D Adverse effect of other vaccines and biological substances, subsequent encounter: Secondary | ICD-10-CM

## 2020-03-21 DIAGNOSIS — T7800XD Anaphylactic reaction due to unspecified food, subsequent encounter: Secondary | ICD-10-CM | POA: Diagnosis not present

## 2020-03-21 NOTE — Progress Notes (Signed)
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 Vaccine reaction - You did not tolerate the Miralax challenge, failing even at the first small dose. - Therefore, we are going to list this as an allergy in your chart. - Miralax contains PEG, which is thought to be the allergenic component of the [place] and Moderna vaccinations.  - I would recommend getting the J&J vaccine since this has a stabilizer that is used in the influenza vaccinations. - You will have to call around to see who has the J&J vaccines.  - You received a dose of cetirizine today in clinic. - Take cetirizine 10mg  twice daily for a few days while you recover.   2. Anaphylactic shock due to food - ? shrimp - We will call you when we get those labs results back. - Hopefully you are not allergic to the shrimp.   3. Follow up as scheduled with Dr. [name].   Subjective:   Brittany Riggs is a 49 y.o. female presenting today for follow up of  Chief Complaint  Patient presents with  . COVID COMPONENT TESTING    KATEENA MCCLINE has a history of the following: Patient Active Problem List   Diagnosis Date Noted  . Immunization reaction 03/06/2020  . 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 Adverse food reaction 08/02/2019  . Not well controlled moderate persistent asthma 06/29/2019  . Seasonal and perennial allergic rhinitis 06/29/2019  . Prediabetes 04/05/2019  . Thyroid nodule 10/19/2016  . Hashimoto's disease 10/19/2016  . Persistent cough 06/23/2016  . Seasonal and perennial allergic rhinitis 06/23/2016  . GERD (gastroesophageal reflux disease) 06/23/2016  . Cough variant asthma 03/13/2016  . Severe obesity (BMI >= 40) ([place]) 03/13/2016  . Upper  airway cough syndrome 03/12/2016    History obtained from: chart review and patient.  Brittany Riggs is a 49 y.o. female presenting for skin testing. She was last seen in March 2021 via televisit with Dr. [name]. At that time, she reported a reaction to her first [place] vaccine. She reported nearly immediate throat swelling as well as itching. She received Benadryl and was watched for 45 minutes. There were no vital sign changes. She took another dose of Benadryl after she got home. Therefore, it was recommended that she receive COVID-19 vaccine component testing.  She has been off antihistamines since Sunday, April 4. However, she also tells me today that she had shrimp on the day of her injection with the first [place] vaccine. She also had a similar episode earlier this week on Tuesday, April 6 where she had some throat itching after eating shrimp. 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  Otherwise, there have been no changes to her past medical history, surgical history, family history, or social history.    Review of Systems  Constitutional: Negative.  Negative for fever, malaise/fatigue and weight loss.  HENT: Negative.  Negative for congestion, ear discharge and ear pain.   Eyes: Negative for pain, discharge and redness.  Respiratory: Negative for cough, sputum production, shortness of breath and wheezing.   Cardiovascular: Negative.  Negative for chest pain and palpitations.  Gastrointestinal: Negative for abdominal pain, heartburn, nausea and vomiting.  Skin: Negative.  Negative for itching and rash.  Neurological: Negative  for dizziness and headaches.  Endo/Heme/Allergies: Negative for environmental allergies. Does not bruise/bleed easily.       Objective:   Blood pressure (!) 142/82, pulse (!) 115, temperature (!) 97.4 F (36.3 C), temperature source Temporal, resp. rate 20, SpO2 99 %. There is no height or weight on file to calculate BMI.   Physical Exam:  General:  alert,  active, in no acute distress. Obese female. Head:  normocephalic, no masses, lesions, tenderness or abnormalities Eyes:  conjunctiva clear without injection or discharge, EOMI, PERL Ears:  TM's pearly white bilaterally, external auditory canals are clear, external ears are normally set and rotated Nose:  External nose within normal limits, enlarged normal appearing turbinates, clear-colored discharge, septum midline Throat:  moist mucous membranes without erythema, exudates or petechiae, no thrush Neck:  Supple without thyromegaly or adenopathy appreciated Lymphatic: no adenopathy appreciated in the cervical, posterior auricular, axillary, epitrochlear, inguinal, or popliteal regions Lungs:  clear to auscultation, no wheezing, crackles or rhonchi, breathing unlabored, moving air well in all lung fields Heart:  regular rate and rhythm, normal S1/S2, no murmurs or gallops, normal peripheral perfusion Neuro:  Normal mental status, speech normal, alert and oriented x3 Musculoskeletal:  no cyanosis, clubbing or edema Skin:  skin color, texture and turgor are normal; no bruising, rashes or lesions noted.   SKIN TESTING (CPT 95018)  SKIN PRICK TESTING ARM #1 SIZE TIME  1. Histamine (1.8mg /mL) 2+ See scanned flowsheet  2. Control (negative - HSA) Negative See scanned flowsheet  3. Triamcinolone (40mg /mL) Negative See scanned flowsheet  4. Methylprednisolone (40mg /mL) Negative See scanned flowsheet  WAIT 15 MINUTES  5. Miralax (1:100 or 1.7mg /mL) Negative See scanned flowsheet  WAIT 15 MINUTES  6. Miralax (1:10 or 17mg /mL) Negative See scanned flowsheet  WAIT 15 MINUTES  7. Miralax (1:1 or 170mg /mL) Negative See scanned flowsheet  WAIT 15 MINUTES   PUNCTURE TEST TOTAL 7    INTRADERMAL TESTING ARM #2 SIZE TIME  1. Control (negative - HSA) Negative See scanned flowsheet  2. Triamcinolone (1:100) Negative See scanned flowsheet  3. Methylprednisolone (1:100) Negative See scanned flowsheet   WAIT 15 MINUTES  4. Triamcinolone (1:10) Negative See scanned flowsheet  5. Methylprednisolone (1:10) Negative See scanned flowsheet  WAIT 15 MINUTES  6. Triamcinolone (1:1) Negative See scanned flowsheet  WAIT 15 MINUTES   INTRADERMAL TEST TOTAL 6    ORAL CHALLENGE (CPT 95076)  DOSE Pre-Vitals (BP/HR/Resp) TIME GIVEN  1. Miralax 170mg /mL suspension 0.64mL  Within normal limits See scanned flow sheet   See scanned flow sheet   WAIT 20 MINUTES  2. Miralax 170mg /mL suspension 52mL  NOT DONE   NOT DONE   WAIT 20 MINUTES  3. Miralax 170mg /mL suspension 51mL  NOT DONE   NOT DONE   WAIT 30-60 MINUTES (To be determined by the provider)   Post-Vitals (BP/HR/Resp)  NOT DONE END TIME   NOT DONE     Total Time    After the first dose of the MiraLAX, she developed itching over her entire body.  She also started having a lot of throat clearing and rhinorrhea.  We did obtain her vitals which were normal.  Her pulse ox was 99%, although her heart rate was 105.  I did listen to her and she was not wheezing.  She reported that her throat was very dry.  She did have around 15 minutes of dry heaving.  After her first report of itching and rhinorrhea, we did administer cetirizine 10 mL which she  tolerated without adverse event.  After 30 minutes, her symptoms had cleared.  We did recommend that she take an antihistamine twice daily for a few days.  We also added MiraLAX to her allergy list.     Salvatore Marvel, MD  [facility]

## 2020-03-21 NOTE — Patient Instructions (Signed)
1. Vaccine reaction, subsequent encounter - You did not tolerate the Miralax challenge, failing even at the first small dose. - Therefore, we are going to list this as an allergy in your chart. - Miralax contains PEG, which is thought to be the allergenic component of the [place] and Moderna vaccinations.  - I would recommend getting the J&J vaccine since this has a stabilizer that is used in the influenza vaccinations. - You will have to call around to see who has the J&J vaccines.  - You received a dose of cetirizine today in clinic. - Take cetirizine 10mg  twice daily for a few days while you recover.   2. Anaphylactic shock due to food - ? shrimp - We will call you when we get those labs results back. - Hopefully you are not allergic to the shrimp.   3. Follow up as scheduled with Dr. [name].   Please inform us of any Emergency Department visits, hospitalizations, or changes in symptoms. Call us before going to the ED for breathing or allergy symptoms since we might be able to fit you in for a sick visit. Feel free to contact us anytime with any questions, problems, or concerns.  It was a pleasure to meet you today!  Websites that have reliable patient information: 1. American Academy of Asthma, Allergy, and Immunology: www.aaaai.org 2. Food Allergy Research and Education (FARE): foodallergy.org 3. Mothers of Asthmatics: http://www.asthmacommunitynetwork.org 4. American College of Allergy, Asthma, and Immunology: www.acaai.org   COVID-19 Vaccine Information can be found at: ShippingScam.co.uk For questions related to vaccine distribution or appointments, please email [email or web address] or call [phone].     "Like" [place] on Facebook and Instagram for our latest updates!       HAPPY SPRING!  Make sure you are registered to vote! If you have moved or changed any of your contact information, you will need to get this  updated before voting!  In some cases, you MAY be able to register to vote online: [email or web address]

## 2020-03-24 LAB — ALLERGEN PROFILE, SHELLFISH
Clam IgE: 0.13 kU/L — AB
F023-IgE Crab: <0.1 kU/L
F080-IgE Lobster: <0.1 kU/L
F290-IgE Oyster: <0.1 kU/L
Scallop IgE: 0.16 kU/L — AB
Shrimp IgE: 0.23 kU/L — AB

## 2020-03-25 ENCOUNTER — Ambulatory Visit: Payer: Self-pay

## 2020-03-27 ENCOUNTER — Ambulatory Visit: Payer: Self-pay

## 2020-04-01 ENCOUNTER — Ambulatory Visit: Payer: Self-pay

## 2020-04-04 ENCOUNTER — Telehealth: Payer: Self-pay | Admitting: *Deleted

## 2020-04-04 ENCOUNTER — Ambulatory Visit: Payer: 59 | Admitting: Internal Medicine

## 2020-04-04 NOTE — Telephone Encounter (Signed)
Pt made appt for second [place] vaccine. Pt states she had allergic reaction after the first vaccine. Her throat closed and she had to be given Benadryl and take it again after getting home. She is scheduled Saturday for second vaccine because it will be the 6 week mark and she is afraid if she does not get it now she will have to start over. She had spoken to her MD who suggested Wynetta Emery and Wynetta Emery which is now not available. Her allergist is [place] and her PCP is [name], MD. I placed a note in her appt slot and informed her that without a referral from one of her doctors I doubted the vaccine site would give her another vaccine. She is allergic to Polyethylene Glycol which she says is in [place] and [place]. She has been encouraged to get in contact with MD but she is reluctant because it is so close to appt date that she thinks it will take too long and she will not be able to get vaccine Saturday as she plans.I again stated that I doubt they will give her the vaccine without a MD approval.

## 2020-04-06 ENCOUNTER — Ambulatory Visit: Payer: Self-pay

## 2020-04-17 ENCOUNTER — Ambulatory Visit (INDEPENDENT_AMBULATORY_CARE_PROVIDER_SITE_OTHER): Payer: 59

## 2020-04-17 DIAGNOSIS — J3089 Other allergic rhinitis: Secondary | ICD-10-CM

## 2020-04-17 DIAGNOSIS — J302 Other seasonal allergic rhinitis: Secondary | ICD-10-CM

## 2020-04-26 ENCOUNTER — Ambulatory Visit (INDEPENDENT_AMBULATORY_CARE_PROVIDER_SITE_OTHER): Payer: 59

## 2020-04-26 DIAGNOSIS — J302 Other seasonal allergic rhinitis: Secondary | ICD-10-CM | POA: Diagnosis not present

## 2020-04-26 DIAGNOSIS — J3089 Other allergic rhinitis: Secondary | ICD-10-CM | POA: Diagnosis not present

## 2020-05-17 ENCOUNTER — Ambulatory Visit (INDEPENDENT_AMBULATORY_CARE_PROVIDER_SITE_OTHER): Payer: 59

## 2020-05-17 DIAGNOSIS — J309 Allergic rhinitis, unspecified: Secondary | ICD-10-CM

## 2020-05-23 DIAGNOSIS — J301 Allergic rhinitis due to pollen: Secondary | ICD-10-CM | POA: Diagnosis not present

## 2020-05-23 NOTE — Progress Notes (Signed)
Exp 05/23/21

## 2020-06-07 ENCOUNTER — Ambulatory Visit (INDEPENDENT_AMBULATORY_CARE_PROVIDER_SITE_OTHER): Payer: 59

## 2020-06-07 DIAGNOSIS — J309 Allergic rhinitis, unspecified: Secondary | ICD-10-CM

## 2020-06-07 DIAGNOSIS — J302 Other seasonal allergic rhinitis: Secondary | ICD-10-CM

## 2020-06-07 DIAGNOSIS — J3089 Other allergic rhinitis: Secondary | ICD-10-CM

## 2020-06-24 ENCOUNTER — Ambulatory Visit: Payer: Self-pay

## 2020-06-24 NOTE — Progress Notes (Signed)
error 

## 2020-06-25 ENCOUNTER — Other Ambulatory Visit: Payer: Self-pay | Admitting: Family Medicine

## 2020-06-25 DIAGNOSIS — Z1231 Encounter for screening mammogram for malignant neoplasm of breast: Secondary | ICD-10-CM

## 2020-06-27 ENCOUNTER — Other Ambulatory Visit: Payer: Self-pay

## 2020-06-27 ENCOUNTER — Ambulatory Visit
Admission: RE | Admit: 2020-06-27 | Discharge: 2020-06-27 | Disposition: A | Discharge status: Home / Self Care | Payer: 59 | Source: Ambulatory Visit

## 2020-06-27 DIAGNOSIS — Z1231 Encounter for screening mammogram for malignant neoplasm of breast: Secondary | ICD-10-CM

## 2020-07-01 ENCOUNTER — Ambulatory Visit (INDEPENDENT_AMBULATORY_CARE_PROVIDER_SITE_OTHER): Payer: 59

## 2020-07-01 DIAGNOSIS — J309 Allergic rhinitis, unspecified: Secondary | ICD-10-CM | POA: Diagnosis not present

## 2020-07-22 ENCOUNTER — Ambulatory Visit (INDEPENDENT_AMBULATORY_CARE_PROVIDER_SITE_OTHER): Payer: 59

## 2020-07-22 DIAGNOSIS — J309 Allergic rhinitis, unspecified: Secondary | ICD-10-CM | POA: Diagnosis not present

## 2020-08-28 ENCOUNTER — Ambulatory Visit (HOSPITAL_COMMUNITY)
Admission: EM | Admit: 2020-08-28 | Discharge: 2020-08-28 | Disposition: A | Discharge status: Home / Self Care | Payer: 59 | Attending: Family Medicine | Admitting: Family Medicine

## 2020-08-28 ENCOUNTER — Encounter (HOSPITAL_COMMUNITY): Payer: Self-pay

## 2020-08-28 ENCOUNTER — Other Ambulatory Visit: Payer: Self-pay

## 2020-08-28 ENCOUNTER — Other Ambulatory Visit: Payer: 59

## 2020-08-28 DIAGNOSIS — J45909 Unspecified asthma, uncomplicated: Secondary | ICD-10-CM | POA: Insufficient documentation

## 2020-08-28 DIAGNOSIS — Z79899 Other long term (current) drug therapy: Secondary | ICD-10-CM | POA: Insufficient documentation

## 2020-08-28 DIAGNOSIS — R7303 Prediabetes: Secondary | ICD-10-CM | POA: Insufficient documentation

## 2020-08-28 DIAGNOSIS — Z793 Long term (current) use of hormonal contraceptives: Secondary | ICD-10-CM | POA: Insufficient documentation

## 2020-08-28 DIAGNOSIS — I10 Essential (primary) hypertension: Secondary | ICD-10-CM | POA: Diagnosis not present

## 2020-08-28 DIAGNOSIS — E041 Nontoxic single thyroid nodule: Secondary | ICD-10-CM | POA: Insufficient documentation

## 2020-08-28 DIAGNOSIS — M069 Rheumatoid arthritis, unspecified: Secondary | ICD-10-CM | POA: Diagnosis not present

## 2020-08-28 DIAGNOSIS — E063 Autoimmune thyroiditis: Secondary | ICD-10-CM | POA: Insufficient documentation

## 2020-08-28 DIAGNOSIS — R05 Cough: Secondary | ICD-10-CM | POA: Diagnosis present

## 2020-08-28 DIAGNOSIS — U071 COVID-19: Secondary | ICD-10-CM | POA: Insufficient documentation

## 2020-08-28 DIAGNOSIS — K580 Irritable bowel syndrome with diarrhea: Secondary | ICD-10-CM | POA: Diagnosis not present

## 2020-08-28 DIAGNOSIS — K219 Gastro-esophageal reflux disease without esophagitis: Secondary | ICD-10-CM | POA: Diagnosis not present

## 2020-08-28 DIAGNOSIS — Z6841 Body Mass Index (BMI) 40.0 and over, adult: Secondary | ICD-10-CM | POA: Diagnosis not present

## 2020-08-28 DIAGNOSIS — J069 Acute upper respiratory infection, unspecified: Secondary | ICD-10-CM | POA: Insufficient documentation

## 2020-08-28 DIAGNOSIS — Z7901 Long term (current) use of anticoagulants: Secondary | ICD-10-CM | POA: Diagnosis not present

## 2020-08-28 MED ORDER — LOPERAMIDE HCL 2 MG PO CAPS: 2.0000 mg | ORAL_CAPSULE | Freq: Four times a day (QID) | ORAL | 0 refills | Status: AC | PRN

## 2020-08-28 MED ORDER — ALBUTEROL SULFATE HFA 108 (90 BASE) MCG/ACT IN AERS: 2.0000 | INHALATION_SPRAY | Freq: Four times a day (QID) | RESPIRATORY_TRACT | 0 refills | Status: DC | PRN

## 2020-08-28 MED ORDER — TRIAMCINOLONE ACETONIDE 40 MG/ML IJ SUSP
INTRAMUSCULAR | Status: AC
  Filled 2020-08-28: qty 2

## 2020-08-28 MED ORDER — ONDANSETRON 4 MG PO TBDP: 4.0000 mg | ORAL_TABLET | Freq: Three times a day (TID) | ORAL | 0 refills | Status: AC | PRN

## 2020-08-28 MED ORDER — TRIAMCINOLONE ACETONIDE 40 MG/ML IJ SUSP
60.0000 mg | Freq: Once | INTRAMUSCULAR | Status: AC
  Administered 2020-08-28: 60 mg via INTRAMUSCULAR

## 2020-08-28 NOTE — ED Triage Notes (Signed)
Pt c/o productive cough w/yellow mucous, SOB, body aches, diarrheax5 days. Pt has non labored breathing. Pt was exposed to [place] + person.

## 2020-08-28 NOTE — ED Provider Notes (Signed)
[facility]    CSN: [id number] Arrival date & time: 08/28/20  1114      History   Chief Complaint Chief Complaint  Patient presents with  . Cough    HPI Brittany Riggs is a 49 y.o. female.   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 Denies CP, N/V, rashes. Tried some mucinex which did seem to help. Has been exposed to someone positive for COVID at a funeral recently. Known hx of asthma, not consistently using inhalers.      Past Medical History:  Diagnosis Date  . Allergy   . Anemia   . Asthma   . Hypertension   . IBS (irritable bowel syndrome)   . Migraine   . Muscle cramp   . Obesity   . Plantar fasciitis   . Rheumatoid arthritis ([place])   . Vitamin D deficiency     Patient Active Problem List   Diagnosis Date Noted  . Immunization reaction 03/06/2020  . 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 Adverse food reaction 08/02/2019  . Not well controlled moderate persistent asthma 06/29/2019  . Seasonal and perennial allergic rhinitis 06/29/2019  . Prediabetes 04/05/2019  . Thyroid nodule 10/19/2016  . Hashimoto's disease 10/19/2016  . Persistent cough 06/23/2016  . Seasonal and perennial allergic rhinitis 06/23/2016  . GERD (gastroesophageal reflux disease) 06/23/2016  . Cough variant asthma 03/13/2016  . Severe obesity (BMI >= 40) ([place]) 03/13/2016  . Upper airway cough syndrome 03/12/2016    Past Surgical History:  Procedure Laterality Date  . COLONOSCOPY WITH PROPOFOL N/A 09/28/2018   Procedure: COLONOSCOPY WITH PROPOFOL;  Surgeon: Arta Silence, MD;  Location: [facility];  Service: Endoscopy;  Laterality: N/A;  . POLYPECTOMY  09/28/2018   Procedure: POLYPECTOMY;  Surgeon: Arta Silence, MD;  Location: [facility];  Service: Endoscopy;;    OB History   No obstetric history on file.       Home Medications    Prior to Admission medications   Medication Sig Start Date End Date Taking? Authorizing Provider  albuterol (PROAIR HFA) 108 (90 Base) MCG/ACT inhaler Inhale 2 puffs into the lungs every 6 (six) hours as needed for wheezing or shortness of breath. 08/28/20   Volney American, PA-C  amLODipine (NORVASC) 2.5 MG tablet Take 2.5 mg by mouth daily.     [provider]  beclomethasone (QVAR REDIHALER) 80 MCG/ACT inhaler Inhale 2 puffs into the lungs 2 (two) times daily as needed (asthma). 06/29/19   Garnet Sierras, DO  Beclomethasone Dipropionate (QNASL) 80 MCG/ACT AERS Place 2 sprays into the nose daily as needed (allergies).    [provider]  carvedilol (COREG) 3.125 MG tablet TK 1 T PO BID 12/19/18   [provider]  chlorpheniramine (CHLOR-TRIMETON) 4 MG tablet Take 4 mg by mouth daily as needed for allergies.     [provider]  chlorthalidone (HYGROTON) 25 MG tablet TK 1 T PO QD IN THE MORNING WF 10/26/18   [provider]  Cholecalciferol (VITAMIN D3) 5000 units TABS Take 5,000 Units by mouth daily.     [provider]  ciprofloxacin (CIPRO) 250 MG tablet TK 1 T PO BID FOR 7 DAYS 04/27/19   [provider]  cyclobenzaprine (FLEXERIL) 10 MG tablet Take 1 tablet (10 mg total) by mouth 3 (three) times daily as needed for muscle spasms. 11/22/17   Davonna Belling,  MD  erythromycin with ethanol (EMGEL) 2 % gel APP EXT AA BID 05/07/19   [provider]  Eszopiclone 3 MG TABS Take 3 mg by mouth at bedtime as needed (sleep). Take immediately before bedtime     [provider]  famotidine (PEPCID) 20 MG tablet Take 20 mg by mouth daily as needed for heartburn. Reported on 06/23/2016    [provider]  fluticasone (CUTIVATE) 0.05 % cream APPLY 1 APPLICATION TO FACE BID FOR 1 WEEK 06/20/19   [provider]  hydrochlorothiazide (HYDRODIURIL) 25 MG tablet Take 25 mg by mouth daily.     [provider]  hydrocortisone 2.5 % cream APPLY TOPICALLY BID FOR 14 DAYS 03/20/19   [provider]  hydroxychloroquine (PLAQUENIL) 200 MG tablet Take 400 mg by mouth daily.    [provider]  irbesartan (AVAPRO) 300 MG tablet Take 300 mg by mouth daily.    [provider]  levonorgestrel-ethinyl estradiol (SEASONALE,INTROVALE,JOLESSA) 0.15-0.03 MG tablet Take 1 tablet by mouth daily.    [provider]  Lifitegrast Shirley Friar) 5 % SOLN Place 1 drop into both eyes 2 (two) times daily.    [provider]  loperamide (IMODIUM) 2 MG capsule Take 1 capsule (2 mg total) by mouth 4 (four) times daily as needed for diarrhea or loose stools. 08/28/20   Volney American, PA-C  loratadine (CLARITIN) 10 MG tablet Take 10 mg by mouth daily. Reported on 06/23/2016    [provider]  metroNIDAZOLE (METROCREAM) 0.75 % cream APPLY 1 THIN LAYER APPLICATION TO FACE BID 06/20/19   [provider]  minocycline (DYNACIN) 100 MG tablet TK 1 T PO D 06/20/19   [provider]  mometasone-formoterol (DULERA) 200-5 MCG/ACT AERO Inhale 2 puffs into the lungs 2 (two) times daily. 06/29/19   Garnet Sierras, DO  montelukast (SINGULAIR) 10 MG tablet Take 1 tablet (10 mg total) by mouth at bedtime. 07/14/19   Garnet Sierras, DO  naratriptan (AMERGE) 2.5 MG tablet TK 1 T PO QD PRF HA 09/22/18   [provider]  omeprazole (PRILOSEC) 40 MG capsule Take 1 capsule (40 mg total) by mouth daily. 06/29/19   Garnet Sierras, DO  ondansetron (ZOFRAN ODT) 4 MG disintegrating tablet Take 1 tablet (4 mg total) by mouth every 8 (eight) hours as needed for nausea or vomiting. 08/28/20   Volney American, PA-C  polyethylene glycol-electrolytes (NULYTELY/GOLYTELY) 420 g solution MIX AND DRINK UTD 09/23/18   [provider]  Respiratory Therapy Supplies (FLUTTER) DEVI Use as directed 03/26/16   Tanda Rockers, MD  tiZANidine (ZANAFLEX) 2 MG tablet TK 1 T PO  HS PRN 06/27/19   [provider]  traMADol (ULTRAM) 50 MG tablet 1-2 every 4 hours as needed for cough or pain Patient taking differently: Take 50-100 mg by mouth every 4 (four) hours as needed (pain). 1-2 every 4 hours as needed for cough or pain 05/15/16   Tanda Rockers, MD  valACYclovir (VALTREX) 1000 MG tablet TK 1 T PO BID 03/12/19   [provider]  venlafaxine XR (EFFEXOR-XR) 150 MG 24 hr capsule Take 150 mg by mouth 2 (two) times daily.     [provider]    Family History Family History  Problem Relation Age of Onset  . Asthma Sister   . Hypertension Sister   . Hypercholesterolemia Sister   . Sleep apnea Sister   . Arthritis Sister   . Diabetes Sister   .  Allergies Sister   . Rheum arthritis Mother   . Hypertension Mother   . Diabetes Mother   . Rheum arthritis Sister   . Kidney disease Father   . Colon cancer Father   . Breast cancer Paternal Grandmother   . Colon cancer Paternal Grandfather   . Allergic rhinitis Neg Hx   . Angioedema Neg Hx   . Atopy Neg Hx   . Eczema Neg Hx   . Immunodeficiency Neg Hx   . Urticaria Neg Hx     Social History Social History   Tobacco Use  . Smoking status: Never Smoker  . Smokeless tobacco: Never Used  Vaping Use  . Vaping Use: Never used  Substance Use Topics  . Alcohol use: No    Alcohol/week: 0.0 standard drinks  . Drug use: No     Allergies   Amoxicillin, Codeine, Penicillins, Prednisone, and Sulfa antibiotics   Review of Systems Review of Systems PER HPI   Physical Exam Triage Vital Signs ED Triage Vitals  Enc Vitals Group     BP 08/28/20 1305 113/72     Pulse Rate 08/28/20 1305 90     Resp 08/28/20 1305 20     Temp 08/28/20 1305 100 F (37.8 C)     Temp Source 08/28/20 1305 Oral     SpO2 08/28/20 1305 98 %     Weight 08/28/20 1309 (!) 345 lb (156.5 kg)     Height 08/28/20 1309 5\' 10"  (1.778 m)     Head Circumference --      Peak Flow --      Pain Score 08/28/20 1309 6       Pain Loc --      Pain Edu? --      Excl. in [place]? --    No data found.  Updated Vital Signs BP 113/72   Pulse 90   Temp 100 F (37.8 C) (Oral)   Resp 20   Ht 5\' 10"  (1.778 m)   Wt (!) 345 lb (156.5 kg)   SpO2 98%   BMI 49.50 kg/m   Visual Acuity Right Eye Distance:   Left Eye Distance:   Bilateral Distance:    Right Eye Near:   Left Eye Near:    Bilateral Near:     Physical Exam Vitals and nursing note reviewed.  Constitutional:      Appearance: Normal appearance.     Comments: Appears lethargic and winded  HENT:     Head: Atraumatic.     Right Ear: Tympanic membrane normal.     Left Ear: Tympanic membrane normal.     Nose: Nose normal. No congestion.     Mouth/Throat:     Mouth: Mucous membranes are moist.     Pharynx: Oropharynx is clear. No oropharyngeal exudate.  Eyes:     Extraocular Movements: Extraocular movements intact.     Conjunctiva/sclera: Conjunctivae normal.  Cardiovascular:     Rate and Rhythm: Normal rate and regular rhythm.     Heart sounds: Normal heart sounds.  Pulmonary:     Effort: Pulmonary effort is normal.     Breath sounds: Wheezing (diffuse mild wheezes) present. No rales.  Abdominal:     General: Bowel sounds are normal. There is no distension.     Palpations: Abdomen is soft.     Tenderness: There is no abdominal tenderness. There is no guarding.  Musculoskeletal:        General: Normal range of motion.  Cervical back: Normal range of motion and neck supple.  Skin:    General: Skin is warm and dry.     Findings: No rash.  Neurological:     Mental Status: She is alert and oriented to person, place, and time.  Psychiatric:        Mood and Affect: Mood normal.        Thought Content: Thought content normal.        Judgment: Judgment normal.    UC Treatments / Results  Labs (all labs ordered are listed, but only abnormal results are displayed) Labs Reviewed  SARS CORONAVIRUS 2 (TAT 6-24 HRS)     EKG   Radiology No results found.  Procedures Procedures (including critical care time)  Medications Ordered in UC Medications  triamcinolone acetonide (KENALOG-40) injection 60 mg (60 mg Intramuscular Given 08/28/20 1356)    Initial Impression / Assessment and Plan / UC Course  I have reviewed the triage vital signs and the nursing notes.  Pertinent labs & imaging results that were available during my care of the patient were reviewed by me and considered in my medical decision making (see chart for details).     Suspicious for COVID, which seems to be causing an asthma exacerbation. COVID pcr pending, isolation protocol reviewed and work note given. IM kenalog administered today and inhaler refilled for home use. Also rx sent for zofran and imodium for prn use. Push fluids, rest. If sxs worsening must go to ER, pt agreeable to this plan.    Final Clinical Impressions(s) / UC Diagnoses   Final diagnoses:  Viral URI with cough   Discharge Instructions   None    ED Prescriptions    Medication Sig Dispense Auth. Provider   albuterol (PROAIR HFA) 108 (90 Base) MCG/ACT inhaler Inhale 2 puffs into the lungs every 6 (six) hours as needed for wheezing or shortness of breath. 18 g Volney American, PA-C   ondansetron (ZOFRAN ODT) 4 MG disintegrating tablet Take 1 tablet (4 mg total) by mouth every 8 (eight) hours as needed for nausea or vomiting. 21 tablet Volney American, [place]   loperamide (IMODIUM) 2 MG capsule Take 1 capsule (2 mg total) by mouth 4 (four) times daily as needed for diarrhea or loose stools. 20 capsule Volney American, [place]     PDMP not reviewed this encounter.   Volney American, [place] 08/28/20 1723

## 2020-08-29 LAB — SARS CORONAVIRUS 2 (TAT 6-24 HRS): SARS Coronavirus 2: POSITIVE — AB

## 2020-08-30 ENCOUNTER — Telehealth: Payer: Self-pay | Admitting: Unknown Physician Specialty

## 2020-08-30 NOTE — Telephone Encounter (Signed)
Called to Discuss with patient about Covid symptoms and the use of the monoclonal antibody infusion for those with mild to moderate Covid symptoms and at a high risk of hospitalization.     Pt appears to qualify for this infusion due to co-morbid conditions and/or a member of an at-risk group in accordance with the FDA Emergency Use Authorization.    Unable to reach pt    

## 2020-09-01 ENCOUNTER — Inpatient Hospital Stay (HOSPITAL_COMMUNITY)
Admission: EM | Admit: 2020-09-01 | Discharge: 2020-09-06 | DRG: 177 | Disposition: A | Discharge status: Home / Self Care | Payer: 59 | Attending: Family Medicine | Admitting: Family Medicine

## 2020-09-01 ENCOUNTER — Other Ambulatory Visit: Payer: Self-pay

## 2020-09-01 ENCOUNTER — Emergency Department (HOSPITAL_COMMUNITY): Payer: 59

## 2020-09-01 DIAGNOSIS — Z833 Family history of diabetes mellitus: Secondary | ICD-10-CM

## 2020-09-01 DIAGNOSIS — T380X5A Adverse effect of glucocorticoids and synthetic analogues, initial encounter: Secondary | ICD-10-CM | POA: Diagnosis present

## 2020-09-01 DIAGNOSIS — K219 Gastro-esophageal reflux disease without esophagitis: Secondary | ICD-10-CM | POA: Diagnosis present

## 2020-09-01 DIAGNOSIS — U071 COVID-19: Secondary | ICD-10-CM | POA: Diagnosis not present

## 2020-09-01 DIAGNOSIS — Z83438 Family history of other disorder of lipoprotein metabolism and other lipidemia: Secondary | ICD-10-CM

## 2020-09-01 DIAGNOSIS — Z803 Family history of malignant neoplasm of breast: Secondary | ICD-10-CM

## 2020-09-01 DIAGNOSIS — Z79899 Other long term (current) drug therapy: Secondary | ICD-10-CM

## 2020-09-01 DIAGNOSIS — Z6841 Body Mass Index (BMI) 40.0 and over, adult: Secondary | ICD-10-CM

## 2020-09-01 DIAGNOSIS — Z91040 Latex allergy status: Secondary | ICD-10-CM

## 2020-09-01 DIAGNOSIS — R7989 Other specified abnormal findings of blood chemistry: Secondary | ICD-10-CM | POA: Diagnosis present

## 2020-09-01 DIAGNOSIS — E860 Dehydration: Secondary | ICD-10-CM | POA: Diagnosis not present

## 2020-09-01 DIAGNOSIS — Z888 Allergy status to other drugs, medicaments and biological substances status: Secondary | ICD-10-CM

## 2020-09-01 DIAGNOSIS — F329 Major depressive disorder, single episode, unspecified: Secondary | ICD-10-CM | POA: Diagnosis present

## 2020-09-01 DIAGNOSIS — J1282 Pneumonia due to coronavirus disease 2019: Secondary | ICD-10-CM | POA: Diagnosis present

## 2020-09-01 DIAGNOSIS — Z800 Family history of malignant neoplasm of digestive organs: Secondary | ICD-10-CM

## 2020-09-01 DIAGNOSIS — R197 Diarrhea, unspecified: Secondary | ICD-10-CM | POA: Diagnosis present

## 2020-09-01 DIAGNOSIS — R0902 Hypoxemia: Secondary | ICD-10-CM

## 2020-09-01 DIAGNOSIS — Z841 Family history of disorders of kidney and ureter: Secondary | ICD-10-CM

## 2020-09-01 DIAGNOSIS — Z8249 Family history of ischemic heart disease and other diseases of the circulatory system: Secondary | ICD-10-CM

## 2020-09-01 DIAGNOSIS — R7401 Elevation of levels of liver transaminase levels: Secondary | ICD-10-CM | POA: Diagnosis present

## 2020-09-01 DIAGNOSIS — M069 Rheumatoid arthritis, unspecified: Secondary | ICD-10-CM | POA: Diagnosis present

## 2020-09-01 DIAGNOSIS — F419 Anxiety disorder, unspecified: Secondary | ICD-10-CM | POA: Diagnosis present

## 2020-09-01 DIAGNOSIS — Z8261 Family history of arthritis: Secondary | ICD-10-CM

## 2020-09-01 DIAGNOSIS — J4551 Severe persistent asthma with (acute) exacerbation: Secondary | ICD-10-CM | POA: Diagnosis present

## 2020-09-01 DIAGNOSIS — J4540 Moderate persistent asthma, uncomplicated: Secondary | ICD-10-CM | POA: Diagnosis present

## 2020-09-01 DIAGNOSIS — Y929 Unspecified place or not applicable: Secondary | ICD-10-CM

## 2020-09-01 DIAGNOSIS — N179 Acute kidney failure, unspecified: Secondary | ICD-10-CM

## 2020-09-01 DIAGNOSIS — Z87892 Personal history of anaphylaxis: Secondary | ICD-10-CM

## 2020-09-01 DIAGNOSIS — J9601 Acute respiratory failure with hypoxia: Secondary | ICD-10-CM

## 2020-09-01 DIAGNOSIS — Z825 Family history of asthma and other chronic lower respiratory diseases: Secondary | ICD-10-CM

## 2020-09-01 DIAGNOSIS — D72829 Elevated white blood cell count, unspecified: Secondary | ICD-10-CM | POA: Diagnosis present

## 2020-09-01 DIAGNOSIS — F4321 Adjustment disorder with depressed mood: Secondary | ICD-10-CM | POA: Diagnosis present

## 2020-09-01 DIAGNOSIS — Z7951 Long term (current) use of inhaled steroids: Secondary | ICD-10-CM

## 2020-09-01 DIAGNOSIS — Z880 Allergy status to penicillin: Secondary | ICD-10-CM

## 2020-09-01 DIAGNOSIS — I10 Essential (primary) hypertension: Secondary | ICD-10-CM | POA: Diagnosis present

## 2020-09-01 DIAGNOSIS — R5381 Other malaise: Secondary | ICD-10-CM | POA: Diagnosis present

## 2020-09-01 DIAGNOSIS — E876 Hypokalemia: Secondary | ICD-10-CM | POA: Diagnosis present

## 2020-09-01 HISTORY — DX: Hypothyroidism, unspecified: E03.9

## 2020-09-01 HISTORY — DX: Gastro-esophageal reflux disease without esophagitis: K21.9

## 2020-09-01 LAB — COMPREHENSIVE METABOLIC PANEL
ALT: 223 U/L — ABNORMAL HIGH (ref 0–44)
AST: 156 U/L — ABNORMAL HIGH (ref 15–41)
Albumin: 3.4 g/dL — ABNORMAL LOW (ref 3.5–5.0)
Alkaline Phosphatase: 64 U/L (ref 38–126)
Anion gap: 16 — ABNORMAL HIGH (ref 5–15)
BUN: 37 mg/dL — ABNORMAL HIGH (ref 6–20)
CO2: 21 mmol/L — ABNORMAL LOW (ref 22–32)
Calcium: 9 mg/dL (ref 8.9–10.3)
Chloride: 102 mmol/L (ref 98–111)
Creatinine, Ser: 2.41 mg/dL — ABNORMAL HIGH (ref 0.44–1.00)
GFR calc Af Amer: 26 mL/min — ABNORMAL LOW (ref >60)
GFR calc non Af Amer: 23 mL/min — ABNORMAL LOW (ref >60)
Glucose, Bld: 124 mg/dL — ABNORMAL HIGH (ref 70–99)
Potassium: 3.9 mmol/L (ref 3.5–5.1)
Sodium: 139 mmol/L (ref 135–145)
Total Bilirubin: 2.8 mg/dL — ABNORMAL HIGH (ref 0.3–1.2)
Total Protein: 7.9 g/dL (ref 6.5–8.1)

## 2020-09-01 LAB — D-DIMER, QUANTITATIVE: D-Dimer, Quant: 2.22 ug/mL-FEU — ABNORMAL HIGH (ref 0.00–0.50)

## 2020-09-01 LAB — LACTIC ACID, PLASMA: Lactic Acid, Venous: 1.9 mmol/L (ref 0.5–1.9)

## 2020-09-01 LAB — FIBRINOGEN: Fibrinogen: 710 mg/dL — ABNORMAL HIGH (ref 210–475)

## 2020-09-01 LAB — TRIGLYCERIDES: Triglycerides: 238 mg/dL — ABNORMAL HIGH (ref <150)

## 2020-09-01 LAB — LACTATE DEHYDROGENASE: LDH: 582 U/L — ABNORMAL HIGH (ref 98–192)

## 2020-09-01 NOTE — ED Triage Notes (Signed)
Pt coming from home with EMS with worsening SOB. Pt was dx with COVID on 08/28/20. Pt has labored breathing, tachypneic, and is afebrile. Pt also has cough, gen weakness, dizziness, and loss of taste/smell. Pt diaphorretic PTA and RA O2 sat 83%. Pt on 4L with EMS and now 96%.

## 2020-09-01 NOTE — ED Provider Notes (Signed)
Brittany Riggs   CSN: [id number] Arrival date & time: 09/01/20  2225     History Chief Complaint  Patient presents with  . Shortness of Breath    Brittany Riggs is a 49 y.o. female with a hx of RA (on Plaquenil), asthma, hypertension, presents to the Emergency [facility] complaining of gradual, persistent, progressively worsening fatigue and feeling generally unwell onset 3 weeks ago.  Patient reports she became severely ill 3-4 days ago with shortness of breath.  She reports she has not eaten anything over the last few days.  Patient reports dehydration, persistent diarrhea, palpitations, near syncope.  She denies measured fevers at home.  Patient reports she was diagnosed with Covid on Thursday after she attended a funeral with her siblings.  7 of them now have COVID-19.  Patient reports she had an anaphylactic reaction to the [place] vaccine on the first dose.  She has not received an additional dose.  Movement and exertion make her symptoms significantly worse.  Nothing seems to make them better.  Patient reports her primary care called and additional albuterol medications but she was too sick to go get them.  COVID + on 08/28/2020  Per EMS, patient found to be tachycardic, tachypneic, diaphoretic with oxygen saturation 83% on their arrival.  Was placed on nasal cannula.  The history is provided by the patient and medical records. No language interpreter was used.       Past Medical History:  Diagnosis Date  . Allergy   . Anemia   . Asthma   . Hypertension   . IBS (irritable bowel syndrome)   . Migraine   . Muscle cramp   . Obesity   . Plantar fasciitis   . Rheumatoid arthritis ([place])   . Vitamin D deficiency     Patient Active Problem List   Diagnosis Date Noted  . Immunization reaction 03/06/2020  . 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 Adverse food reaction 08/02/2019  . Not well controlled  moderate persistent asthma 06/29/2019  . Seasonal and perennial allergic rhinitis 06/29/2019  . Prediabetes 04/05/2019  . Thyroid nodule 10/19/2016  . Hashimoto's disease 10/19/2016  . Persistent cough 06/23/2016  . Seasonal and perennial allergic rhinitis 06/23/2016  . GERD (gastroesophageal reflux disease) 06/23/2016  . Cough variant asthma 03/13/2016  . Severe obesity (BMI >= 40) ([place]) 03/13/2016  . Upper airway cough syndrome 03/12/2016    Past Surgical History:  Procedure Laterality Date  . COLONOSCOPY WITH PROPOFOL N/A 09/28/2018   Procedure: COLONOSCOPY WITH PROPOFOL;  Surgeon: Arta Silence, MD;  Location: [facility];  Service: Endoscopy;  Laterality: N/A;  . POLYPECTOMY  09/28/2018   Procedure: POLYPECTOMY;  Surgeon: Arta Silence, MD;  Location: [facility];  Service: Endoscopy;;     OB History   No obstetric history on file.     Family History  Problem Relation Age of Onset  . Asthma Sister   . Hypertension Sister   . Hypercholesterolemia Sister   . Sleep apnea Sister   . Arthritis Sister   . Diabetes Sister   . Allergies Sister   . Rheum arthritis Mother   . Hypertension Mother   . Diabetes Mother   . Rheum arthritis Sister   . Kidney disease Father   . Colon cancer Father   . Breast cancer Paternal Grandmother   . Colon cancer Paternal Grandfather   . Allergic rhinitis Neg Hx   . Angioedema Neg Hx   . Atopy  Neg Hx   . Eczema Neg Hx   . Immunodeficiency Neg Hx   . Urticaria Neg Hx     Social History   Tobacco Use  . Smoking status: Never Smoker  . Smokeless tobacco: Never Used  Vaping Use  . Vaping Use: Never used  Substance Use Topics  . Alcohol use: No    Alcohol/week: 0.0 standard drinks  . Drug use: No    Home Medications Prior to Admission medications   Medication Sig Start Date End Date Taking? Authorizing Provider  albuterol (PROAIR HFA) 108 (90 Base) MCG/ACT inhaler Inhale 2 puffs into the lungs every 6 (six) hours as  needed for wheezing or shortness of breath. 08/28/20   Volney American, Brittany Riggs  amLODipine (NORVASC) 2.5 MG tablet Take 2.5 mg by mouth daily.     [provider]  beclomethasone (QVAR REDIHALER) 80 MCG/ACT inhaler Inhale 2 puffs into the lungs 2 (two) times daily as needed (asthma). 06/29/19   Garnet Sierras, DO  Beclomethasone Dipropionate (QNASL) 80 MCG/ACT AERS Place 2 sprays into the nose daily as needed (allergies).    [provider]  carvedilol (COREG) 3.125 MG tablet TK 1 T PO BID 12/19/18   [provider]  chlorpheniramine (CHLOR-TRIMETON) 4 MG tablet Take 4 mg by mouth daily as needed for allergies.     [provider]  chlorthalidone (HYGROTON) 25 MG tablet TK 1 T PO QD IN THE MORNING WF 10/26/18   [provider]  Cholecalciferol (VITAMIN D3) 5000 units TABS Take 5,000 Units by mouth daily.     [provider]  ciprofloxacin (CIPRO) 250 MG tablet TK 1 T PO BID FOR 7 DAYS 04/27/19   [provider]  cyclobenzaprine (FLEXERIL) 10 MG tablet Take 1 tablet (10 mg total) by mouth 3 (three) times daily as needed for muscle spasms. 11/22/17   Davonna Belling, MD  erythromycin with ethanol (EMGEL) 2 % gel APP EXT AA BID 05/07/19   [provider]  Eszopiclone 3 MG TABS Take 3 mg by mouth at bedtime as needed (sleep). Take immediately before bedtime     [provider]  famotidine (PEPCID) 20 MG tablet Take 20 mg by mouth daily as needed for heartburn. Reported on 06/23/2016    [provider]  fluticasone (CUTIVATE) 0.05 % cream APPLY 1 APPLICATION TO FACE BID FOR 1 WEEK 06/20/19   [provider]  hydrochlorothiazide (HYDRODIURIL) 25 MG tablet Take 25 mg by mouth daily.    [provider]  hydrocortisone 2.5 % cream APPLY TOPICALLY BID FOR 14 DAYS 03/20/19   [provider]  hydroxychloroquine (PLAQUENIL) 200 MG tablet Take 400 mg by mouth daily.    [provider]  irbesartan  (AVAPRO) 300 MG tablet Take 300 mg by mouth daily.    [provider]  levonorgestrel-ethinyl estradiol (SEASONALE,INTROVALE,JOLESSA) 0.15-0.03 MG tablet Take 1 tablet by mouth daily.    [provider]  Lifitegrast Shirley Friar) 5 % SOLN Place 1 drop into both eyes 2 (two) times daily.    [provider]  loperamide (IMODIUM) 2 MG capsule Take 1 capsule (2 mg total) by mouth 4 (four) times daily as needed for diarrhea or loose stools. 08/28/20   Volney American, Brittany Riggs  loratadine (CLARITIN) 10 MG tablet Take 10 mg by mouth daily. Reported on 06/23/2016    [provider]  metroNIDAZOLE (METROCREAM) 0.75 % cream APPLY 1 THIN LAYER APPLICATION TO FACE BID 06/20/19   [provider]  minocycline (DYNACIN) 100 MG tablet TK 1 T PO D 06/20/19   [provider]  mometasone-formoterol (DULERA) 200-5 MCG/ACT AERO Inhale 2 puffs into the lungs 2 (two) times daily. 06/29/19   Garnet Sierras, DO  montelukast (SINGULAIR) 10 MG tablet Take 1 tablet (10 mg total) by mouth at bedtime. 07/14/19   Garnet Sierras, DO  naratriptan (AMERGE) 2.5 MG tablet TK 1 T PO QD PRF HA 09/22/18   [provider]  omeprazole (PRILOSEC) 40 MG capsule Take 1 capsule (40 mg total) by mouth daily. 06/29/19   Garnet Sierras, DO  ondansetron (ZOFRAN ODT) 4 MG disintegrating tablet Take 1 tablet (4 mg total) by mouth every 8 (eight) hours as needed for nausea or vomiting. 08/28/20   Volney American, Brittany Riggs  polyethylene glycol-electrolytes (NULYTELY/GOLYTELY) 420 g solution MIX AND DRINK UTD 09/23/18   [provider]  Respiratory Therapy Supplies (FLUTTER) DEVI Use as directed 03/26/16   Tanda Rockers, MD  tiZANidine (ZANAFLEX) 2 MG tablet TK 1 T PO HS PRN 06/27/19   [provider]  traMADol (ULTRAM) 50 MG tablet 1-2 every 4 hours as needed for cough or pain Patient taking differently: Take 50-100 mg by mouth every 4 (four) hours as needed (pain). 1-2 every 4 hours as  needed for cough or pain 05/15/16   Tanda Rockers, MD  valACYclovir (VALTREX) 1000 MG tablet TK 1 T PO BID 03/12/19   [provider]  venlafaxine XR (EFFEXOR-XR) 150 MG 24 hr capsule Take 150 mg by mouth 2 (two) times daily.     [provider]    Allergies    Amoxicillin, Codeine, Penicillins, Prednisone, and Sulfa antibiotics  Review of Systems   Review of Systems  Constitutional: Positive for activity change, appetite change, chills, fatigue and fever. Negative for diaphoresis and unexpected weight change.  HENT: Negative for mouth sores.   Eyes: Negative for visual disturbance.  Respiratory: Positive for cough and shortness of breath. Negative for chest tightness and wheezing.   Cardiovascular: Negative for chest pain.  Gastrointestinal: Positive for diarrhea and nausea. Negative for abdominal pain, constipation and vomiting.  Endocrine: Negative for polydipsia, polyphagia and polyuria.  Genitourinary: Negative for dysuria, frequency, hematuria and urgency.  Musculoskeletal: Negative for back pain and neck stiffness.  Skin: Negative for rash.  Allergic/Immunologic: Negative for immunocompromised state.  Neurological: Positive for headaches. Negative for syncope and light-headedness.  Hematological: Does not bruise/bleed easily.  Psychiatric/Behavioral: Negative for sleep disturbance. The patient is not nervous/anxious.     Physical Exam Updated Vital Signs BP (!) (P) 141/98 (BP Location: Left Wrist)   Pulse (!) (P) 126   Temp (P) 98.5 F (36.9 C) (Oral)   Resp (!) (P) 30   SpO2 (P) 100%   Physical Exam Vitals and nursing Riggs reviewed.  Constitutional:      General: She is in acute distress.     Appearance: She is ill-appearing. She is not diaphoretic.  HENT:     Head: Normocephalic.     Mouth/Throat:     Mouth: Mucous membranes are dry.     Comments: Dry mucous membranes Eyes:     General: No scleral icterus.    Conjunctiva/sclera: Conjunctivae  normal.  Cardiovascular:     Rate and Rhythm: Regular rhythm. Tachycardia present.     Pulses: Normal pulses.          Radial pulses are 2+ on the right side and 2+ on the  left side.  Pulmonary:     Effort: Tachypnea present. No accessory muscle usage, prolonged expiration, respiratory distress or retractions.     Breath sounds: No stridor. Decreased breath sounds present. No wheezing or rhonchi.     Comments: Equal chest rise. Moderate increased work of breathing. Congested cough Abdominal:     General: There is no distension.     Palpations: Abdomen is soft.     Tenderness: There is no abdominal tenderness. There is no guarding or rebound.  Musculoskeletal:     Cervical back: Normal range of motion.     Comments: Moves all extremities equally and without difficulty.  Skin:    General: Skin is warm and dry.     Capillary Refill: Capillary refill takes less than 2 seconds.  Neurological:     Mental Status: She is alert.     GCS: GCS eye subscore is 4. GCS verbal subscore is 5. GCS motor subscore is 6.     Comments: Speech is clear and goal oriented.  Psychiatric:        Mood and Affect: Mood normal.     ED Results / Procedures / Treatments   Labs (all labs ordered are listed, but only abnormal results are displayed) Labs Reviewed  CBC WITH DIFFERENTIAL/PLATELET - Abnormal; Notable for the following components:      Result Value   WBC 14.5 (*)    All other components within normal limits  COMPREHENSIVE METABOLIC PANEL - Abnormal; Notable for the following components:   CO2 21 (*)    Glucose, Bld 124 (*)    BUN 37 (*)    Creatinine, Ser 2.41 (*)    Albumin 3.4 (*)    AST 156 (*)    ALT 223 (*)    Total Bilirubin 2.8 (*)    GFR calc non Af Amer 23 (*)    GFR calc Af Amer 26 (*)    Anion gap 16 (*)    All other components within normal limits  D-DIMER, QUANTITATIVE (NOT AT [facility]) - Abnormal; Notable for the following components:   D-Dimer, Quant 2.22 (*)    All other  components within normal limits  LACTATE DEHYDROGENASE - Abnormal; Notable for the following components:   LDH 582 (*)    All other components within normal limits  FERRITIN - Abnormal; Notable for the following components:   Ferritin 1,478 (*)    All other components within normal limits  TRIGLYCERIDES - Abnormal; Notable for the following components:   Triglycerides 238 (*)    All other components within normal limits  FIBRINOGEN - Abnormal; Notable for the following components:   Fibrinogen 710 (*)    All other components within normal limits  C-REACTIVE PROTEIN - Abnormal; Notable for the following components:   CRP 5.8 (*)    All other components within normal limits  CULTURE, BLOOD (ROUTINE X 2)  CULTURE, BLOOD (ROUTINE X 2)  LACTIC ACID, PLASMA  PROCALCITONIN  LACTIC ACID, PLASMA  I-STAT BETA HCG BLOOD, ED ([facility], [facility], [facility] ONLY)    EKG EKG Interpretation  Date/Time:  Sunday September 01 2020 22:37:14 EDT Ventricular Rate:  121 PR Interval:    QRS Duration: 79 QT Interval:  304 QTC Calculation: 432 R Axis:   89 Text Interpretation: Sinus tachycardia Atrial premature complexes in couplets LAE, consider biatrial enlargement Nonspecific T abnormalities, inferior leads No old tracing to compare Confirmed by Sherwood Gambler [phone]) on 09/01/2020 10:45:59 PM   Radiology DG Chest Port 1  View  Result Date: 09/01/2020 CLINICAL DATA:  COVID, hypoxia, EXAM: PORTABLE CHEST 1 VIEW COMPARISON:  03/12/2016 FINDINGS: Lung volumes are small and there is bibasilar atelectasis. No pneumothorax or pleural effusion. Cardiac size within normal limits. Pulmonary vascularity normal. No acute bone abnormality. IMPRESSION: Bibasilar atelectasis. Electronically Signed   By: Fidela Salisbury MD   On: 09/01/2020 22:58    Procedures .Critical Care Performed by: Brittany Butts, Brittany Riggs Authorized by: Brittany Butts, Brittany Riggs   Critical care provider statement:    Critical care time (minutes):   60   Critical care time was exclusive of:  Separately billable procedures and treating other patients and teaching time   Critical care was necessary to treat or prevent imminent or life-threatening deterioration of the following conditions:  Respiratory failure and dehydration   Critical care was time spent personally by me on the following activities:  Discussions with consultants, evaluation of patient's response to treatment, examination of patient, ordering and performing treatments and interventions, ordering and review of laboratory studies, ordering and review of radiographic studies, pulse oximetry, re-evaluation of patient's condition, obtaining history from patient or surrogate and review of old charts   I assumed direction of critical care for this patient from another provider in my specialty: no     (including critical care time)  Medications Ordered in ED Medications - No data to display  ED Course  I have reviewed the triage vital signs and the nursing notes.  Pertinent labs & imaging results that were available during my care of the patient were reviewed by me and considered in my medical decision making (see chart for details).  Clinical Course as of Sep 02 28  Sun Sep 01, 2020  2334 Pt with persistent desaturations to 80% with movement in bed on 4L via [place] - increased to 5lpm   [HM]  2334 Significant tachycardia  Pulse Rate(!): (P) 126 [HM]  2334 tachypnea  Resp(!): (P) 30 [HM]  Mon Sep 02, 2020  0017 AKI - no hx of CKD  Creatinine(!): 2.41 [HM]  0017 Elevated   D-Dimer, Quant(!): 2.22 [HM]    Clinical Course User Index [HM] Brittany Riggs, Brittany Riggs   MDM Rules/Calculators/A&P                           Brittany Riggs was evaluated in Emergency Riggs on 09/01/2020 for the symptoms described in the history of present illness. She was evaluated in the context of the global COVID-19 pandemic, which necessitated consideration that the patient might be at  risk for infection with the SARS-CoV-2 virus that causes COVID-19. Institutional protocols and algorithms that pertain to the evaluation of patients at risk for COVID-19 are in a state of rapid change based on information released by regulatory bodies including the CDC and federal and state organizations. These policies and algorithms were followed during the patient's care in the ED.  Presents emergency Riggs after recent diagnosis of Covid.  Tachycardic, tachypneic and hypoxic.  She is critically ill.  Patient with rapid desaturations with any movement in bed.  Oxygen increased to 5 L via nasal cannula.  Leukocytosis noted.  Additional labs pending.  12:19 AM Significant lab abnormalities consistent with COVID-19.  Most concerning is new acute kidney injury with a creatinine of 2.41.  Last creatinine 0.89.  Patient is clinically dehydrated.  Some concern for COVID related PE, but pt cannot have CTA at this time due to AKI.  Pt without chest  pain.   12:27 AM Discussed patient's case with hospitalist, Brittany Riggs.  I have recommended admission and patient (and family if present) agree with this plan. Admitting physician will place admission orders.    The patient was discussed with and seen by Brittany Riggs who agrees with the treatment plan.   Final Clinical Impression(s) / ED Diagnoses Final diagnoses:  Acute hypoxemic respiratory failure due to COVID-19 [facility])  AKI (acute kidney injury) ([place])  Dehydration    Rx / DC Orders ED Discharge Orders    None       Avamarie Crossley, Brittany Riggs 09/02/20 0029    Brittany Greek, MD 09/02/20 [phone]

## 2020-09-02 ENCOUNTER — Inpatient Hospital Stay (HOSPITAL_COMMUNITY): Payer: 59

## 2020-09-02 ENCOUNTER — Encounter (HOSPITAL_COMMUNITY): Payer: Self-pay | Admitting: Family Medicine

## 2020-09-02 DIAGNOSIS — Z880 Allergy status to penicillin: Secondary | ICD-10-CM | POA: Diagnosis not present

## 2020-09-02 DIAGNOSIS — R7989 Other specified abnormal findings of blood chemistry: Secondary | ICD-10-CM | POA: Diagnosis present

## 2020-09-02 DIAGNOSIS — J4540 Moderate persistent asthma, uncomplicated: Secondary | ICD-10-CM | POA: Diagnosis present

## 2020-09-02 DIAGNOSIS — Y929 Unspecified place or not applicable: Secondary | ICD-10-CM | POA: Diagnosis not present

## 2020-09-02 DIAGNOSIS — N179 Acute kidney failure, unspecified: Secondary | ICD-10-CM | POA: Diagnosis present

## 2020-09-02 DIAGNOSIS — I10 Essential (primary) hypertension: Secondary | ICD-10-CM

## 2020-09-02 DIAGNOSIS — J9601 Acute respiratory failure with hypoxia: Secondary | ICD-10-CM

## 2020-09-02 DIAGNOSIS — U071 COVID-19: Principal | ICD-10-CM

## 2020-09-02 DIAGNOSIS — Z87892 Personal history of anaphylaxis: Secondary | ICD-10-CM | POA: Diagnosis not present

## 2020-09-02 DIAGNOSIS — Z825 Family history of asthma and other chronic lower respiratory diseases: Secondary | ICD-10-CM | POA: Diagnosis not present

## 2020-09-02 DIAGNOSIS — Z6841 Body Mass Index (BMI) 40.0 and over, adult: Secondary | ICD-10-CM | POA: Diagnosis not present

## 2020-09-02 DIAGNOSIS — R0902 Hypoxemia: Secondary | ICD-10-CM

## 2020-09-02 DIAGNOSIS — E860 Dehydration: Secondary | ICD-10-CM

## 2020-09-02 DIAGNOSIS — M069 Rheumatoid arthritis, unspecified: Secondary | ICD-10-CM | POA: Diagnosis present

## 2020-09-02 DIAGNOSIS — F329 Major depressive disorder, single episode, unspecified: Secondary | ICD-10-CM | POA: Diagnosis present

## 2020-09-02 DIAGNOSIS — F419 Anxiety disorder, unspecified: Secondary | ICD-10-CM | POA: Diagnosis present

## 2020-09-02 DIAGNOSIS — R197 Diarrhea, unspecified: Secondary | ICD-10-CM | POA: Diagnosis present

## 2020-09-02 DIAGNOSIS — D72829 Elevated white blood cell count, unspecified: Secondary | ICD-10-CM | POA: Diagnosis present

## 2020-09-02 DIAGNOSIS — J1282 Pneumonia due to coronavirus disease 2019: Secondary | ICD-10-CM

## 2020-09-02 DIAGNOSIS — F4321 Adjustment disorder with depressed mood: Secondary | ICD-10-CM | POA: Diagnosis present

## 2020-09-02 DIAGNOSIS — T380X5A Adverse effect of glucocorticoids and synthetic analogues, initial encounter: Secondary | ICD-10-CM | POA: Diagnosis present

## 2020-09-02 DIAGNOSIS — Z888 Allergy status to other drugs, medicaments and biological substances status: Secondary | ICD-10-CM | POA: Diagnosis not present

## 2020-09-02 DIAGNOSIS — Z79899 Other long term (current) drug therapy: Secondary | ICD-10-CM | POA: Diagnosis not present

## 2020-09-02 DIAGNOSIS — K219 Gastro-esophageal reflux disease without esophagitis: Secondary | ICD-10-CM | POA: Diagnosis present

## 2020-09-02 DIAGNOSIS — Z91040 Latex allergy status: Secondary | ICD-10-CM | POA: Diagnosis not present

## 2020-09-02 LAB — CBC WITH DIFFERENTIAL/PLATELET
Abs Immature Granulocytes: 1.96 10*3/uL — ABNORMAL HIGH (ref 0.00–0.07)
Abs Immature Granulocytes: 1.52 10*3/uL — ABNORMAL HIGH (ref 0.00–0.07)
Basophils Absolute: 0.1 10*3/uL (ref 0.0–0.1)
Basophils Absolute: 0 10*3/uL (ref 0.0–0.1)
Basophils Relative: 1 %
Basophils Relative: 0 %
Eosinophils Absolute: 0 10*3/uL (ref 0.0–0.5)
Eosinophils Absolute: 0 10*3/uL (ref 0.0–0.5)
Eosinophils Relative: 0 %
Eosinophils Relative: 0 %
HCT: 39.1 % (ref 36.0–46.0)
HCT: 36.6 % (ref 36.0–46.0)
Hemoglobin: 12.6 g/dL (ref 12.0–15.0)
Hemoglobin: 12 g/dL (ref 12.0–15.0)
Immature Granulocytes: 14 %
Immature Granulocytes: 11 %
Lymphocytes Relative: 7 %
Lymphocytes Relative: 7 %
Lymphs Abs: 1 10*3/uL (ref 0.7–4.0)
Lymphs Abs: 1 10*3/uL (ref 0.7–4.0)
MCH: 31.6 pg (ref 26.0–34.0)
MCH: 32.3 pg (ref 26.0–34.0)
MCHC: 32.2 g/dL (ref 30.0–36.0)
MCHC: 32.8 g/dL (ref 30.0–36.0)
MCV: 98 fL (ref 80.0–100.0)
MCV: 98.4 fL (ref 80.0–100.0)
Monocytes Absolute: 1.2 10*3/uL — ABNORMAL HIGH (ref 0.1–1.0)
Monocytes Absolute: 0.4 10*3/uL (ref 0.1–1.0)
Monocytes Relative: 8 %
Monocytes Relative: 3 %
Neutro Abs: 10.2 10*3/uL — ABNORMAL HIGH (ref 1.7–7.7)
Neutro Abs: 10.6 10*3/uL — ABNORMAL HIGH (ref 1.7–7.7)
Neutrophils Relative %: 70 %
Neutrophils Relative %: 79 %
Platelets: 345 10*3/uL (ref 150–400)
Platelets: 348 10*3/uL (ref 150–400)
RBC: 3.99 MIL/uL (ref 3.87–5.11)
RBC: 3.72 MIL/uL — ABNORMAL LOW (ref 3.87–5.11)
RDW: 14.6 % (ref 11.5–15.5)
RDW: 14.6 % (ref 11.5–15.5)
WBC: 14.5 10*3/uL — ABNORMAL HIGH (ref 4.0–10.5)
WBC: 13.6 10*3/uL — ABNORMAL HIGH (ref 4.0–10.5)
nRBC: 0 % (ref 0.0–0.2)
nRBC: 0 % (ref 0.0–0.2)

## 2020-09-02 LAB — COMPREHENSIVE METABOLIC PANEL
ALT: 206 U/L — ABNORMAL HIGH (ref 0–44)
AST: 133 U/L — ABNORMAL HIGH (ref 15–41)
Albumin: 3.1 g/dL — ABNORMAL LOW (ref 3.5–5.0)
Alkaline Phosphatase: 63 U/L (ref 38–126)
Anion gap: 16 — ABNORMAL HIGH (ref 5–15)
BUN: 36 mg/dL — ABNORMAL HIGH (ref 6–20)
CO2: 21 mmol/L — ABNORMAL LOW (ref 22–32)
Calcium: 8.7 mg/dL — ABNORMAL LOW (ref 8.9–10.3)
Chloride: 99 mmol/L (ref 98–111)
Creatinine, Ser: 2.15 mg/dL — ABNORMAL HIGH (ref 0.44–1.00)
GFR calc Af Amer: 30 mL/min — ABNORMAL LOW (ref >60)
GFR calc non Af Amer: 26 mL/min — ABNORMAL LOW (ref >60)
Glucose, Bld: 180 mg/dL — ABNORMAL HIGH (ref 70–99)
Potassium: 3.2 mmol/L — ABNORMAL LOW (ref 3.5–5.1)
Sodium: 136 mmol/L (ref 135–145)
Total Bilirubin: 1.6 mg/dL — ABNORMAL HIGH (ref 0.3–1.2)
Total Protein: 7.5 g/dL (ref 6.5–8.1)

## 2020-09-02 LAB — LACTIC ACID, PLASMA: Lactic Acid, Venous: 2 mmol/L (ref 0.5–1.9)

## 2020-09-02 LAB — HEPARIN LEVEL (UNFRACTIONATED)
Heparin Unfractionated: 0.41 IU/mL (ref 0.30–0.70)
Heparin Unfractionated: 0.98 IU/mL — ABNORMAL HIGH (ref 0.30–0.70)

## 2020-09-02 LAB — I-STAT BETA HCG BLOOD, ED (MC, WL, AP ONLY): I-stat hCG, quantitative: <5 m[IU]/mL (ref <5)

## 2020-09-02 LAB — FERRITIN: Ferritin: 1478 ng/mL — ABNORMAL HIGH (ref 11–307)

## 2020-09-02 LAB — C-REACTIVE PROTEIN
CRP: 5.8 mg/dL — ABNORMAL HIGH (ref <1.0)
CRP: 5.5 mg/dL — ABNORMAL HIGH (ref <1.0)

## 2020-09-02 LAB — D-DIMER, QUANTITATIVE: D-Dimer, Quant: 1.57 ug/mL-FEU — ABNORMAL HIGH (ref 0.00–0.50)

## 2020-09-02 LAB — PROCALCITONIN: Procalcitonin: 0.16 ng/mL

## 2020-09-02 LAB — HIV ANTIBODY (ROUTINE TESTING W REFLEX): HIV Screen 4th Generation wRfx: NONREACTIVE

## 2020-09-02 MED ORDER — POTASSIUM CHLORIDE CRYS ER 20 MEQ PO TBCR
40.0000 meq | EXTENDED_RELEASE_TABLET | Freq: Once | ORAL | Status: AC
  Administered 2020-09-02: 40 meq via ORAL
  Filled 2020-09-02: qty 2

## 2020-09-02 MED ORDER — ALBUTEROL SULFATE HFA 108 (90 BASE) MCG/ACT IN AERS
2.0000 | INHALATION_SPRAY | Freq: Four times a day (QID) | RESPIRATORY_TRACT | Status: DC
  Administered 2020-09-02 – 2020-09-06 (×16): 2 via RESPIRATORY_TRACT
  Filled 2020-09-02 (×2): qty 6.7

## 2020-09-02 MED ORDER — PREDNISONE 20 MG PO TABS: 50.0000 mg | ORAL_TABLET | Freq: Every day | ORAL | Status: DC

## 2020-09-02 MED ORDER — CARVEDILOL 6.25 MG PO TABS
6.2500 mg | ORAL_TABLET | Freq: Two times a day (BID) | ORAL | Status: DC
  Administered 2020-09-02 – 2020-09-06 (×9): 6.25 mg via ORAL
  Filled 2020-09-02 (×9): qty 1

## 2020-09-02 MED ORDER — SODIUM CHLORIDE 0.9 % IV SOLN: 200.0000 mg | Freq: Once | INTRAVENOUS | Status: DC

## 2020-09-02 MED ORDER — TIZANIDINE HCL 4 MG PO TABS
4.0000 mg | ORAL_TABLET | Freq: Four times a day (QID) | ORAL | Status: DC | PRN
  Administered 2020-09-02 – 2020-09-06 (×7): 4 mg via ORAL
  Filled 2020-09-02 (×8): qty 1

## 2020-09-02 MED ORDER — SODIUM CHLORIDE 0.9 % IV SOLN
100.0000 mg | Freq: Every day | INTRAVENOUS | Status: AC
  Administered 2020-09-03 – 2020-09-06 (×4): 100 mg via INTRAVENOUS
  Filled 2020-09-02 (×4): qty 20

## 2020-09-02 MED ORDER — MONTELUKAST SODIUM 10 MG PO TABS
10.0000 mg | ORAL_TABLET | Freq: Every day | ORAL | Status: DC
  Administered 2020-09-02 – 2020-09-05 (×4): 10 mg via ORAL
  Filled 2020-09-02 (×5): qty 1

## 2020-09-02 MED ORDER — AMLODIPINE BESYLATE 2.5 MG PO TABS
2.5000 mg | ORAL_TABLET | Freq: Every day | ORAL | Status: DC
  Administered 2020-09-02 – 2020-09-06 (×5): 2.5 mg via ORAL
  Filled 2020-09-02 (×5): qty 1

## 2020-09-02 MED ORDER — PANTOPRAZOLE SODIUM 40 MG PO TBEC
40.0000 mg | DELAYED_RELEASE_TABLET | Freq: Every day | ORAL | Status: DC
  Administered 2020-09-02 – 2020-09-06 (×5): 40 mg via ORAL
  Filled 2020-09-02 (×5): qty 1

## 2020-09-02 MED ORDER — ZOLPIDEM TARTRATE 5 MG PO TABS
5.0000 mg | ORAL_TABLET | Freq: Every evening | ORAL | Status: DC | PRN
  Administered 2020-09-02 – 2020-09-05 (×4): 5 mg via ORAL
  Filled 2020-09-02 (×4): qty 1

## 2020-09-02 MED ORDER — ONDANSETRON HCL 4 MG PO TABS: 4.0000 mg | ORAL_TABLET | Freq: Four times a day (QID) | ORAL | Status: DC | PRN

## 2020-09-02 MED ORDER — VENLAFAXINE HCL ER 150 MG PO CP24
150.0000 mg | ORAL_CAPSULE | Freq: Two times a day (BID) | ORAL | Status: DC
  Administered 2020-09-02 – 2020-09-06 (×9): 150 mg via ORAL
  Filled 2020-09-02: qty 1
  Filled 2020-09-02 (×2): qty 2
  Filled 2020-09-02 (×7): qty 1

## 2020-09-02 MED ORDER — LACTATED RINGERS IV SOLN: INTRAVENOUS | Status: DC

## 2020-09-02 MED ORDER — ONDANSETRON HCL 4 MG/2ML IJ SOLN
4.0000 mg | Freq: Four times a day (QID) | INTRAMUSCULAR | Status: DC | PRN
  Administered 2020-09-02: 4 mg via INTRAVENOUS
  Filled 2020-09-02: qty 2

## 2020-09-02 MED ORDER — MOMETASONE FURO-FORMOTEROL FUM 200-5 MCG/ACT IN AERO
2.0000 | INHALATION_SPRAY | Freq: Two times a day (BID) | RESPIRATORY_TRACT | Status: DC
  Administered 2020-09-02 – 2020-09-06 (×9): 2 via RESPIRATORY_TRACT
  Filled 2020-09-02: qty 8.8

## 2020-09-02 MED ORDER — ZINC SULFATE 220 (50 ZN) MG PO CAPS
220.0000 mg | ORAL_CAPSULE | Freq: Every day | ORAL | Status: DC
  Administered 2020-09-02 – 2020-09-06 (×5): 220 mg via ORAL
  Filled 2020-09-02 (×5): qty 1

## 2020-09-02 MED ORDER — GUAIFENESIN-DM 100-10 MG/5ML PO SYRP
10.0000 mL | ORAL_SOLUTION | ORAL | Status: DC | PRN
  Administered 2020-09-02 – 2020-09-05 (×4): 10 mL via ORAL
  Filled 2020-09-02 (×5): qty 10

## 2020-09-02 MED ORDER — CHLORTHALIDONE 25 MG PO TABS
25.0000 mg | ORAL_TABLET | Freq: Every day | ORAL | Status: DC
  Filled 2020-09-02: qty 1

## 2020-09-02 MED ORDER — SODIUM CHLORIDE 0.9 % IV SOLN
1.0000 mg/kg | Freq: Two times a day (BID) | INTRAVENOUS | Status: DC
  Administered 2020-09-02 – 2020-09-03 (×3): 160 mg via INTRAVENOUS
  Filled 2020-09-02 (×5): qty 1.28

## 2020-09-02 MED ORDER — BENZONATATE 100 MG PO CAPS
200.0000 mg | ORAL_CAPSULE | Freq: Three times a day (TID) | ORAL | Status: DC
  Administered 2020-09-02 – 2020-09-06 (×13): 200 mg via ORAL
  Filled 2020-09-02 (×13): qty 2

## 2020-09-02 MED ORDER — SODIUM CHLORIDE 0.9 % IV SOLN
200.0000 mg | Freq: Once | INTRAVENOUS | Status: AC
  Administered 2020-09-02: 200 mg via INTRAVENOUS
  Filled 2020-09-02: qty 40

## 2020-09-02 MED ORDER — TECHNETIUM TO 99M ALBUMIN AGGREGATED
3.8000 | Freq: Once | INTRAVENOUS | Status: AC | PRN
  Administered 2020-09-02: 3.8 via INTRAVENOUS

## 2020-09-02 MED ORDER — HYDROXYCHLOROQUINE SULFATE 200 MG PO TABS
400.0000 mg | ORAL_TABLET | Freq: Every day | ORAL | Status: DC
  Administered 2020-09-03 – 2020-09-06 (×4): 400 mg via ORAL
  Filled 2020-09-02 (×4): qty 2

## 2020-09-02 MED ORDER — LOPERAMIDE HCL 2 MG PO CAPS
2.0000 mg | ORAL_CAPSULE | ORAL | Status: DC | PRN
  Administered 2020-09-02 (×2): 2 mg via ORAL
  Filled 2020-09-02 (×2): qty 1

## 2020-09-02 MED ORDER — ASCORBIC ACID 500 MG PO TABS
500.0000 mg | ORAL_TABLET | Freq: Every day | ORAL | Status: DC
  Administered 2020-09-02 – 2020-09-06 (×5): 500 mg via ORAL
  Filled 2020-09-02 (×5): qty 1

## 2020-09-02 MED ORDER — ACETAMINOPHEN 325 MG PO TABS
650.0000 mg | ORAL_TABLET | Freq: Four times a day (QID) | ORAL | Status: DC | PRN
  Administered 2020-09-03 – 2020-09-05 (×2): 650 mg via ORAL
  Filled 2020-09-02: qty 2

## 2020-09-02 MED ORDER — ALBUTEROL SULFATE HFA 108 (90 BASE) MCG/ACT IN AERS: 2.0000 | INHALATION_SPRAY | RESPIRATORY_TRACT | Status: DC | PRN

## 2020-09-02 MED ORDER — HEPARIN BOLUS VIA INFUSION
5000.0000 [IU] | Freq: Once | INTRAVENOUS | Status: AC
  Administered 2020-09-02: 5000 [IU] via INTRAVENOUS
  Filled 2020-09-02: qty 5000

## 2020-09-02 MED ORDER — ALUM & MAG HYDROXIDE-SIMETH 200-200-20 MG/5ML PO SUSP
30.0000 mL | Freq: Four times a day (QID) | ORAL | Status: DC | PRN
  Administered 2020-09-02: 30 mL via ORAL
  Filled 2020-09-02: qty 30

## 2020-09-02 MED ORDER — HEPARIN (PORCINE) 25000 UT/250ML-% IV SOLN
1400.0000 [IU]/h | INTRAVENOUS | Status: DC
  Administered 2020-09-02: 1600 [IU]/h via INTRAVENOUS
  Administered 2020-09-03: 1400 [IU]/h via INTRAVENOUS
  Filled 2020-09-02 (×3): qty 250

## 2020-09-02 MED ORDER — SODIUM CHLORIDE 0.9 % IV SOLN: 100.0000 mg | Freq: Every day | INTRAVENOUS | Status: DC

## 2020-09-02 MED ORDER — INFLUENZA VAC SPLIT QUAD 0.5 ML IM SUSY: 0.5000 mL | PREFILLED_SYRINGE | INTRAMUSCULAR | Status: DC

## 2020-09-02 NOTE — ED Notes (Signed)
Pt transferred to hospital bed for comfort.

## 2020-09-02 NOTE — Progress Notes (Signed)
Responded to consult for IV. RN stated no longer needed. Consult cleared.

## 2020-09-02 NOTE — Progress Notes (Signed)
VASCULAR LAB    Bilateral lower extremity venous duplex has been performed.  See CV proc for preliminary results.   Deryck Hippler, RVT 09/02/2020, 11:54 AM

## 2020-09-02 NOTE — ED Notes (Signed)
Bedside commode at pt's bedside. 

## 2020-09-02 NOTE — Progress Notes (Signed)
[place] for Heparin Indication: pulmonary embolus, R/O  Allergies  Allergen Reactions  . Amoxicillin Hives  . Codeine Itching  . Penicillins Hives    Has patient had a PCN reaction causing immediate rash, facial/tongue/throat swelling, SOB or lightheadedness with hypotension: Yes Has patient had a PCN reaction causing severe rash involving mucus membranes or skin necrosis: no Has patient had a PCN reaction that required hospitalization: no Has patient had a PCN reaction occurring within the last 10 years: no If all of the above answers are "NO", then may proceed with Cephalosporin use.   . Prednisone Other (See Comments)    Severe cramps and "spasms"  . Sulfa Antibiotics Other (See Comments)    Does not remember side effects.     Patient Measurements: Height: 5\' 10"  (177.8 cm) Weight: (!) 156 kg (343 lb 14.7 oz) IBW/kg (Calculated) : 68.5 Heparin Dosing Weight:106.7 kg   Vital Signs: Temp: 98.2 F (36.8 C) (09/20 1733) Temp Source: Oral (09/20 1733) BP: 114/70 (09/20 1733) Pulse Rate: 99 (09/20 1733)  Labs: Recent Labs    09/01/20 2230 09/02/20 0947 09/02/20 1628  HGB 12.6 12.0  --   HCT 39.1 36.6  --   PLT 345 348  --   HEPARINUNFRC  --  0.41 0.98*  CREATININE 2.41* 2.15*  --     Estimated Creatinine Clearance: 51.7 mL/min (A) (by C-G formula based on SCr of 2.15 mg/dL (H)).   Medical History: Past Medical History:  Diagnosis Date  . Allergy   . Anemia   . Asthma   . GERD (gastroesophageal reflux disease)   . Hypertension   . Hypothyroidism   . IBS (irritable bowel syndrome)   . Migraine   . Muscle cramp   . Obesity   . Plantar fasciitis   . Rheumatoid arthritis ([place])   . Vitamin D deficiency     Assessment: 49 yr old woman with COVID, to start heparin for R/O PE, d-dimer 1.57. She was not on anticoagulation PTA.    VQ scan 9/20: no findings indicative of PE Vascular ultrasound 9/20: no evidence of DVT in  either lower extremity; however, portions of the exams were limited  Initial heparin level was therapeutic (0.41 units/ml) on heparin infusion at 1600 units/h. Confirmatory heparin level drawn ~7 hrs later was 0.98 units/ml, which is above the goal range for this pt. H/H, platelets WNL. Per RN, no issues with IV or bleeding observed.  Goal of Therapy:  Heparin level 0.3-0.7 units/ml Monitor platelets by anticoagulation protocol: Yes   Plan:  Decrease heparin infusion to 1400 units/hr Check 6-hr heparin level Monitor daily heparin level, CBC Monitor for signs/symptoms of bleeding  Gillermina Hu, PharmD, BCPS, [facility] Clinical Pharmacist 09/02/2020 6:06 PM

## 2020-09-02 NOTE — Progress Notes (Signed)
ANTICOAGULATION CONSULT NOTE - Initial Consult  Pharmacy Consult for heparin Indication: pulmonary embolus, r/o  Allergies  Allergen Reactions  . Amoxicillin Hives  . Codeine Itching  . Penicillins Hives    Has patient had a PCN reaction causing immediate rash, facial/tongue/throat swelling, SOB or lightheadedness with hypotension: Yes Has patient had a PCN reaction causing severe rash involving mucus membranes or skin necrosis: no Has patient had a PCN reaction that required hospitalization: no Has patient had a PCN reaction occurring within the last 10 years: no If all of the above answers are "NO", then may proceed with Cephalosporin use.   . Prednisone Other (See Comments)    Severe cramps and "spasms"  . Sulfa Antibiotics Other (See Comments)    Does not remember side effects.     Patient Measurements: Height: 5\' 10"  (177.8 cm) Weight: (!) 156 kg (343 lb 14.7 oz) IBW/kg (Calculated) : 68.5 Heparin Dosing Weight:106.7 kg   Vital Signs: Temp: 98.5 F (36.9 C) (09/20 0040) Temp Source: Oral (09/20 0040) BP: 174/95 (09/20 0040) Pulse Rate: 119 (09/20 0040)  Labs: Recent Labs    09/01/20 2230  HGB 12.6  HCT 39.1  PLT 345  CREATININE 2.41*    Estimated Creatinine Clearance: 46.1 mL/min (A) (by C-G formula based on SCr of 2.41 mg/dL (H)).   Medical History: Past Medical History:  Diagnosis Date  . Allergy   . Anemia   . Asthma   . GERD (gastroesophageal reflux disease)   . Hypertension   . Hypothyroidism   . IBS (irritable bowel syndrome)   . Migraine   . Muscle cramp   . Obesity   . Plantar fasciitis   . Rheumatoid arthritis ([place])   . Vitamin D deficiency     Medications:  See medication history  Assessment: 49 yo COVID positive to start heparin for r/o PE.  She was not on anticoagulation PTA.  Hg 12.6, PTLC 345 Goal of Therapy:  Heparin level 0.3-0.7 units/ml Monitor platelets by anticoagulation protocol: Yes   Plan:  Heparin 5000 unit bolus  and drip at 1600 units/hr Check heparin level in 6-8 hours Daily HL and CBC Monitor for bleeding complications  Salisha Bardsley [place] 09/02/2020,1:26 AM

## 2020-09-02 NOTE — H&P (Signed)
History and Physical    Brittany Riggs OEV:[id number] DOB: 06/06/1971 DOA: 09/01/2020  PCP: [name], MD   Patient coming from: Home  Chief Complaint: Cough, shortness of breath, generalized weakness, body aches  HPI: Brittany Riggs is a 49 y.o. female with medical history significant for moderate persistent asthma, hypertension, rheumatoid arthritis, morbid obesity who presents to the emergency room for evaluation of cough, shortness of breath, generalized body aches, generalized weakness for the last 3 weeks.  Ms. Brittany Riggs reports she does not been feeling well for the last 3 weeks with generalized fatigue and malaise.  She reports that 4 to 5 days ago she began to have cough with shortness of breath that progressively worsened.  She states that she was seen in urgent care 3 days ago and was diagnosed with Covid 19 infection.  She was not sent for outpatient treatment with [facility].  She states that her mother has been sick for the last few months and she has been traveling back and forth to [facility] on the weekends.  She reports her mother died at the beginning of this month and she attended the funeral last week.  Reportedly 7 of the family was at the funeral were Covid positive.  She reports she has been having increasing shortness of breath for the last few days and cannot even walk across a room without having profound fatigue, weakness and being short of breath from the exertion.  She does states she received the first dose of the Pfizer vaccine in April of this year but had a anaphylactic reaction and therefore never got the second dose.  She states that she was scheduled to go have a [employer] Covid vaccine next week.  She states that she has tried using Tylenol for her symptoms but it has not helped much.  She is also tried over-the-counter cough occasion.  She states that she has been profound weakness, fatigue and shortness of breath even when she is  sitting and resting and any type of exertion makes her feel worse.  ED Course: [place] is found to be Covid positive in the emergency room and is hypoxic with O2 sat in the mid to upper 80s on room air according to the ER provider.  Inflammatory labs are elevated and consistent with COVID-19 infection.  Patient also found to have acute kidney injury on her labs.  She is dehydrated from the diarrhea and Covid infection.  AKI is a known chyli of Covid infection.  Patient is requiring supplemental oxygen to maintain O2 sats.  Hospital service is asked to admit for further treatment and management.  Review of Systems:  General: Reports generalized weakness and body aches.  Reports subjective fever, chills.  Nuys weight loss, night sweats.  Denies dizziness.  Creased appetite HENT: Denies head trauma, headache, denies change in hearing, tinnitus.  Denies nasal congestion or bleeding.  Denies sore throat, sores in mouth.  Denies difficulty swallowing Eyes: Denies blurry vision, pain in eye, drainage.  Denies discoloration of eyes. Neck: Denies pain.  Denies swelling.  Denies pain with movement. Cardiovascular: Denies chest pain, palpitations.  Denies edema.  Denies orthopnea Respiratory: Reports shortness of breath with cough.  Reports wheezing.  Denies sputum production Gastrointestinal: Denies abdominal pain, swelling.  Ports diarrhea.  Reports nausea no vomiting.  Denies melena.  Denies hematemesis. Musculoskeletal: Reports diffuse body aches.  Denies limitation of movement.  Denies deformity or swelling.  Genitourinary: Denies pelvic pain.  Denies urinary frequency  or hesitancy.  Denies dysuria.  Skin: Denies rash.  Denies petechiae, purpura, ecchymosis. Neurological: Reports headache.  Denies syncope.  Denies seizure activity.  Denies paresthesia.  Denies slurred speech, drooping face.  Denies visual change. Psychiatric: Denies depression, anxiety.  Denies suicidal thoughts or ideation.  Denies  hallucinations.  Past Medical History:  Diagnosis Date  . Allergy   . Anemia   . Asthma   . GERD (gastroesophageal reflux disease)   . Hypertension   . Hypothyroidism   . IBS (irritable bowel syndrome)   . Migraine   . Muscle cramp   . Obesity   . Plantar fasciitis   . Rheumatoid arthritis ([place])   . Vitamin D deficiency     Past Surgical History:  Procedure Laterality Date  . COLONOSCOPY WITH PROPOFOL N/A 09/28/2018   Procedure: COLONOSCOPY WITH PROPOFOL;  Surgeon: Arta Silence, MD;  Location: [facility];  Service: Endoscopy;  Laterality: N/A;  . POLYPECTOMY  09/28/2018   Procedure: POLYPECTOMY;  Surgeon: Arta Silence, MD;  Location: [facility];  Service: Endoscopy;;    Social History  reports that she has never smoked. She has never used smokeless tobacco. She reports that she does not drink alcohol and does not use drugs.  Allergies  Allergen Reactions  . Amoxicillin Hives  . Codeine Itching  . Penicillins Hives    Has patient had a PCN reaction causing immediate rash, facial/tongue/throat swelling, SOB or lightheadedness with hypotension: Yes Has patient had a PCN reaction causing severe rash involving mucus membranes or skin necrosis: no Has patient had a PCN reaction that required hospitalization: no Has patient had a PCN reaction occurring within the last 10 years: no If all of the above answers are "NO", then may proceed with Cephalosporin use.   . Prednisone Other (See Comments)    Severe cramps and "spasms"  . Sulfa Antibiotics Other (See Comments)    Does not remember side effects.     Family History  Problem Relation Age of Onset  . Asthma Sister   . Hypertension Sister   . Hypercholesterolemia Sister   . Sleep apnea Sister   . Arthritis Sister   . Diabetes Sister   . Allergies Sister   . Rheum arthritis Mother   . Hypertension Mother   . Diabetes Mother   . Rheum arthritis Sister   . Kidney disease Father   . Colon cancer Father   .  Breast cancer Paternal Grandmother   . Colon cancer Paternal Grandfather   . Allergic rhinitis Neg Hx   . Angioedema Neg Hx   . Atopy Neg Hx   . Eczema Neg Hx   . Immunodeficiency Neg Hx   . Urticaria Neg Hx      Prior to Admission medications   Medication Sig Start Date End Date Taking? Authorizing Provider  albuterol (PROAIR HFA) 108 (90 Base) MCG/ACT inhaler Inhale 2 puffs into the lungs every 6 (six) hours as needed for wheezing or shortness of breath. 08/28/20   Volney American, PA-C  amLODipine (NORVASC) 2.5 MG tablet Take 2.5 mg by mouth daily.     [provider]  beclomethasone (QVAR REDIHALER) 80 MCG/ACT inhaler Inhale 2 puffs into the lungs 2 (two) times daily as needed (asthma). 06/29/19   Garnet Sierras, DO  Beclomethasone Dipropionate (QNASL) 80 MCG/ACT AERS Place 2 sprays into the nose daily as needed (allergies).    [provider]  carvedilol (COREG) 3.125 MG tablet TK 1 T PO BID 12/19/18  [provider]  chlorpheniramine (CHLOR-TRIMETON) 4 MG tablet Take 4 mg by mouth daily as needed for allergies.     [provider]  chlorthalidone (HYGROTON) 25 MG tablet TK 1 T PO QD IN THE MORNING WF 10/26/18   [provider]  Cholecalciferol (VITAMIN D3) 5000 units TABS Take 5,000 Units by mouth daily.     [provider]  ciprofloxacin (CIPRO) 250 MG tablet TK 1 T PO BID FOR 7 DAYS 04/27/19   [provider]  cyclobenzaprine (FLEXERIL) 10 MG tablet Take 1 tablet (10 mg total) by mouth 3 (three) times daily as needed for muscle spasms. 11/22/17   Davonna Belling, MD  erythromycin with ethanol (EMGEL) 2 % gel APP EXT AA BID 05/07/19   [provider]  Eszopiclone 3 MG TABS Take 3 mg by mouth at bedtime as needed (sleep). Take immediately before bedtime     [provider]  famotidine (PEPCID) 20 MG tablet Take 20 mg by mouth daily as needed for heartburn. Reported on 06/23/2016    [provider]   fluticasone (CUTIVATE) 0.05 % cream APPLY 1 APPLICATION TO FACE BID FOR 1 WEEK 06/20/19   [provider]  hydrochlorothiazide (HYDRODIURIL) 25 MG tablet Take 25 mg by mouth daily.    [provider]  hydrocortisone 2.5 % cream APPLY TOPICALLY BID FOR 14 DAYS 03/20/19   [provider]  hydroxychloroquine (PLAQUENIL) 200 MG tablet Take 400 mg by mouth daily.    [provider]  irbesartan (AVAPRO) 300 MG tablet Take 300 mg by mouth daily.    [provider]  levonorgestrel-ethinyl estradiol (SEASONALE,INTROVALE,JOLESSA) 0.15-0.03 MG tablet Take 1 tablet by mouth daily.    [provider]  Lifitegrast Shirley Friar) 5 % SOLN Place 1 drop into both eyes 2 (two) times daily.    [provider]  loperamide (IMODIUM) 2 MG capsule Take 1 capsule (2 mg total) by mouth 4 (four) times daily as needed for diarrhea or loose stools. 08/28/20   Volney American, PA-C  loratadine (CLARITIN) 10 MG tablet Take 10 mg by mouth daily. Reported on 06/23/2016    [provider]  metroNIDAZOLE (METROCREAM) 0.75 % cream APPLY 1 THIN LAYER APPLICATION TO FACE BID 06/20/19   [provider]  minocycline (DYNACIN) 100 MG tablet TK 1 T PO D 06/20/19   [provider]  mometasone-formoterol (DULERA) 200-5 MCG/ACT AERO Inhale 2 puffs into the lungs 2 (two) times daily. 06/29/19   Garnet Sierras, DO  montelukast (SINGULAIR) 10 MG tablet Take 1 tablet (10 mg total) by mouth at bedtime. 07/14/19   Garnet Sierras, DO  naratriptan (AMERGE) 2.5 MG tablet TK 1 T PO QD PRF HA 09/22/18   [provider]  omeprazole (PRILOSEC) 40 MG capsule Take 1 capsule (40 mg total) by mouth daily. 06/29/19   Garnet Sierras, DO  ondansetron (ZOFRAN ODT) 4 MG disintegrating tablet Take 1 tablet (4 mg total) by mouth every 8 (eight) hours as needed for nausea or vomiting. 08/28/20   Volney American, PA-C  polyethylene glycol-electrolytes (NULYTELY/GOLYTELY) 420 g  solution MIX AND DRINK UTD 09/23/18   [provider]  Respiratory Therapy Supplies (FLUTTER) DEVI Use as directed 03/26/16   Tanda Rockers, MD  tiZANidine (ZANAFLEX) 2 MG tablet TK 1 T PO HS PRN 06/27/19   [provider]  traMADol (ULTRAM) 50 MG tablet 1-2 every 4 hours as needed for cough or pain Patient taking differently:  Take 50-100 mg by mouth every 4 (four) hours as needed (pain). 1-2 every 4 hours as needed for cough or pain 05/15/16   Tanda Rockers, MD  valACYclovir (VALTREX) 1000 MG tablet TK 1 T PO BID 03/12/19   [provider]  venlafaxine XR (EFFEXOR-XR) 150 MG 24 hr capsule Take 150 mg by mouth 2 (two) times daily.     [provider]    Physical Exam: Vitals:   09/01/20 2235 09/02/20 0000 09/02/20 0040  BP: (!) (P) 141/98 (!) 148/103 (!) 174/95  Pulse: (!) (P) 126 (!) 116 (!) 119  Resp: (!) (P) 30 (!) 28 (!) 25  Temp: (P) 98.5 F (36.9 C)  98.5 F (36.9 C)  TempSrc: (P) Oral  Oral  SpO2: (P) 100%  97%  Weight:   (!) 156 kg  Height:   5\' 10"  (1.778 m)    Constitutional: NAD, calm, comfortable Vitals:   09/01/20 2235 09/02/20 0000 09/02/20 0040  BP: (!) (P) 141/98 (!) 148/103 (!) 174/95  Pulse: (!) (P) 126 (!) 116 (!) 119  Resp: (!) (P) 30 (!) 28 (!) 25  Temp: (P) 98.5 F (36.9 C)  98.5 F (36.9 C)  TempSrc: (P) Oral  Oral  SpO2: (P) 100%  97%  Weight:   (!) 156 kg  Height:   5\' 10"  (1.778 m)   General: WDWN, Alert and oriented x3.  Eyes: EOMI, PERRL, lids and conjunctivae normal.  Sclera nonicteric HENT:  [place]/AT, external ears normal.  Nares patent without epistasis.  Mucous membranes are moist. Posterior pharynx clear of any exudate or lesions.   Neck: Soft, normal range of motion, supple, no masses, no thyromegaly.  Trachea midline Respiratory: Equal breath sounds but diminished.  Diffuse scattered Rales.  Diffuse expiratory wheezing. no crackles. Normal respiratory effort. No accessory muscle use.  Cardiovascular: Sinus  tachycardia, no murmurs / rubs / gallops. No extremity edema. 1+ pedal pulses.  Abdomen: Soft, no tenderness, nondistended, no rebound or guarding.  Morbidly obese.  No masses palpated. Bowel sounds normoactive Musculoskeletal: FROM. no clubbing / cyanosis. No joint deformity upper and lower extremities. Normal muscle tone.  Skin: Warm, dry, intact no rashes, lesions, ulcers. No induration Neurologic: CN 2-12 grossly intact.  Normal speech.  Sensation intact. Strength 5/5 in all extremities.   Psychiatric: Normal judgment and insight.  Normal mood.    Labs on Admission: I have personally reviewed following labs and imaging studies  CBC: Recent Labs  Lab 09/01/20 2230  WBC 14.5*  NEUTROABS PENDING  HGB 12.6  HCT 39.1  MCV 98.0  PLT 027    Basic Metabolic Panel: Recent Labs  Lab 09/01/20 2230  NA 139  K 3.9  CL 102  CO2 21*  GLUCOSE 124*  BUN 37*  CREATININE 2.41*  CALCIUM 9.0    GFR: Estimated Creatinine Clearance: 46.1 mL/min (A) (by C-G formula based on SCr of 2.41 mg/dL (H)).  Liver Function Tests: Recent Labs  Lab 09/01/20 2230  AST 156*  ALT 223*  ALKPHOS 64  BILITOT 2.8*  PROT 7.9  ALBUMIN 3.4*    Urine analysis: No results found for: COLORURINE, APPEARANCEUR, LABSPEC, PHURINE, GLUCOSEU, HGBUR, BILIRUBINUR, KETONESUR, PROTEINUR, UROBILINOGEN, NITRITE, LEUKOCYTESUR  Radiological Exams on Admission: DG Chest Port 1 View  Result Date: 09/01/2020 CLINICAL DATA:  COVID, hypoxia, EXAM: PORTABLE CHEST 1 VIEW COMPARISON:  03/12/2016 FINDINGS: Lung volumes are small and there is bibasilar atelectasis. No pneumothorax or pleural effusion. Cardiac size within normal limits. Pulmonary vascularity  normal. No acute bone abnormality. IMPRESSION: Bibasilar atelectasis. Electronically Signed   By: Fidela Salisbury MD   On: 09/01/2020 22:58    EKG: Independently reviewed.  EKG shows sinus tachycardia with PACs.  Nonspecific ST changes.  QTc is  432  Assessment/Plan Principal Problem:   Pneumonia due to COVID-19 virus Ms. Tift will be admitted to medical telemetry floor under COVID-19 precautions and protocols. She started on remdesivir and Solu-Medrol Check daily labs of CBC, ferritin, D-dimer, CRP, CMP Incentive spirometer every hour while awake. Albuterol MDI every 4 hours as needed for shortness of breath, cough, wheeze Supplemental oxygen to keep O2 sat between 90 to 96%.  Active Problems:   AKI (acute kidney injury) ([place]) IV fluid hydration with LR at 100 mils per hour. Monitor electrolytes and renal function morning with labs    Hypoxia Supplemental oxygen as needed as above    Not well controlled moderate persistent asthma  Continued on Dulera MDI twice a day.  Albuterol MDI for cough, shortness of breath, wheeze    Essential hypertension Continue home medications of Coreg, chlorthalidone, Norvasc.  ARB therapy is held until AKI improves     Dehydration Fluid hydration with LR as above.  Monitor fluid status    Severe obesity (BMI >= 40) (HCC) Patient will need to  follow up with PCP for dietary lifestyle changes for weight loss.    DVT prophylaxis: Is placed on heparin infusion for therapeutic anticoagulation as cannot rule out PE secondary to AKI and has elevated D-dimer, dyspnea and hypoxia in setting of Covid infection.  Covid is a prothrombotic condition Code Status:   Full code Family Communication:  Diagnosis and plan discussed with patient.  Patient verbalized understanding agrees with plan.  Questions were answered.  Further recommendations to follow as clinically indicated.  I did call patient's sister per patient's request to give her sister and update on her condition. Disposition Plan:   Patient is from:  Home  Anticipated DC to:  Home  Anticipated DC date:  Anticipate greater than 2 midnights of hospitalization to treat acute medical condition  Anticipated DC barriers: No barriers to  discharge identified at this time   Admission status:  Inpatient  Severity of Illness: The appropriate patient status for this patient is INPATIENT. Inpatient status is judged to be reasonable and necessary in order to provide the required intensity of service to ensure the patient's safety. The patient's presenting symptoms, physical exam findings, and initial radiographic and laboratory data in the context of their chronic comorbidities is felt to place them at high risk for further clinical deterioration. Furthermore, it is not anticipated that the patient will be medically stable for discharge from the hospital within 2 midnights of admission. The following factors support the patient status of inpatient.    * I certify that at the point of admission it is my clinical judgment that the patient will require inpatient hospital care spanning beyond 2 midnights from the point of admission due to high intensity of service, high risk for further deterioration and high frequency of surveillance required.Yevonne Aline Renna Kilmer MD [facility]  How to contact the [facility] Attending or Consulting provider [place] or covering provider during after hours [place], for this patient?   1. Check the care team in [facility] and look for a) attending/consulting TR[facility] provider listed and b) the [facility] team listed 2. Log into [email or web address] and use [place]'s universal password to access. If you do not  have the password, please contact the hospital operator. 3. Locate the [facility] provider you are looking for under [facility] and page to a number that you can be directly reached. 4. If you still have difficulty reaching the provider, please page the [facility] (Director on Call) for the Hospitalists listed on amion for assistance.  09/02/2020, 1:19 AM

## 2020-09-02 NOTE — ED Notes (Signed)
Pt transported to [facility] 

## 2020-09-02 NOTE — ED Notes (Signed)
Unable to collect x2 blood cultures due to not enough blood and pt difficult stick. Lab has been alerted.

## 2020-09-02 NOTE — ED Notes (Signed)
Pt urinated and defecated before going to [facility].

## 2020-09-02 NOTE — Progress Notes (Signed)
[place] for heparin Indication: pulmonary embolus, r/o  Allergies  Allergen Reactions  . Amoxicillin Hives  . Codeine Itching  . Penicillins Hives    Has patient had a PCN reaction causing immediate rash, facial/tongue/throat swelling, SOB or lightheadedness with hypotension: Yes Has patient had a PCN reaction causing severe rash involving mucus membranes or skin necrosis: no Has patient had a PCN reaction that required hospitalization: no Has patient had a PCN reaction occurring within the last 10 years: no If all of the above answers are "NO", then may proceed with Cephalosporin use.   . Prednisone Other (See Comments)    Severe cramps and "spasms"  . Sulfa Antibiotics Other (See Comments)    Does not remember side effects.     Patient Measurements: Height: 5\' 10"  (177.8 cm) Weight: (!) 156 kg (343 lb 14.7 oz) IBW/kg (Calculated) : 68.5 Heparin Dosing Weight:106.7 kg   Vital Signs: Temp: 98.2 F (36.8 C) (09/20 0747) Temp Source: Oral (09/20 0747) BP: 118/79 (09/20 0747) Pulse Rate: 101 (09/20 0747)  Labs: Recent Labs    09/01/20 2230 09/02/20 0947  HGB 12.6 12.0  HCT 39.1 36.6  PLT 345 348  HEPARINUNFRC  --  0.41  CREATININE 2.41* 2.15*    Estimated Creatinine Clearance: 51.7 mL/min (A) (by C-G formula based on SCr of 2.15 mg/dL (H)).   Medical History: Past Medical History:  Diagnosis Date  . Allergy   . Anemia   . Asthma   . GERD (gastroesophageal reflux disease)   . Hypertension   . Hypothyroidism   . IBS (irritable bowel syndrome)   . Migraine   . Muscle cramp   . Obesity   . Plantar fasciitis   . Rheumatoid arthritis ([place])   . Vitamin D deficiency     Medications:  See medication history  Assessment: 49 yo COVID positive to start heparin for r/o PE.  She was not on anticoagulation PTA.  Hg 12.6, PTLC 345  Initial heparin level therapeutic on 1600 units/hr, for VQ scan today  Goal of Therapy:   Heparin level 0.3-0.7 units/ml Monitor platelets by anticoagulation protocol: Yes   Plan:  Continue heparin gtt at 1600 units/hr F/u 6 hour heparin level to confirm F/u PE workup, VQ scan  Bertis Ruddy, PharmD Clinical Pharmacist ED Pharmacist Phone # [phone] 09/02/2020 11:39 AM

## 2020-09-02 NOTE — Progress Notes (Signed)
Brittany Riggs is a 49 y.o. female patient admitted from ED awake, alert - oriented  X 4 - no acute distress noted.  VSS - Blood pressure 114/70, pulse 99, temperature 98.2 F (36.8 C), temperature source Oral, resp. rate 17, height 5\' 10"  (1.778 m), weight (!) 156 kg, SpO2 96 %.      Call light within reach, patient able to voice, and demonstrate understanding.  Skin, clean-dry- intact without evidence of bruising, or skin tears.   No evidence of skin break down noted on exam.   Will cont to eval and treat per MD orders.  Jeanella Craze, RN 09/02/2020 6:23 PM

## 2020-09-02 NOTE — ED Notes (Signed)
Date and time results received: 09/02/20 0330 (use smartphrase ".now" to insert current time)  Test: Lactic Critical Value: 2.0  Name of Provider Notified: MD Chotiner  Orders Received? Or Actions Taken?: Awaiting Orders

## 2020-09-02 NOTE — Progress Notes (Signed)
PROGRESS NOTE                                                                                                                                                                                                             Patient Demographics:    Brittany Riggs, is a 49 y.o. female, DOB - [birth year], BPZ:[id number]  Outpatient Primary MD for the patient is [name], MD   Admit date - 09/01/2020   LOS - 0  Chief Complaint  Patient presents with  . Shortness of Breath       Brief Narrative: Patient is a 49 y.o. female with PMHx of bronchial asthma, rheumatoid arthritis, HTN, morbid obesity-who has been not feeling well for past 3 weeks with fatigue, malaise, cough-developed worsening cough and shortness of breath approximately 3 days prior to this hospital stay-she subsequently presented to the hospital on 9/19 with shortness of breath and was diagnosed with acute hypoxic respiratory failure secondary to COVID-19 pneumonia.   COVID-19 vaccinated status: Vaccinated x1 with Pfizer vaccine in March 2021-subsequently developed anaphylaxis.  Has not yet received second dose.  Significant Events: 9/15>> diagnosed with COVID-19 9/15>> Admit to [facility] for hypoxia due to COVID-19, AKI, transaminitis.  Significant studies: 9/19>>Chest x-ray: Bibasilar atelectasis. 9/20>> VQ scan: Pending 9/20>> lower extremity Doppler: Pending   COVID-19 medications: Steroids: 9/19>> Remdesivir: 9/19>>  Antibiotics: None  Microbiology data: 9/20>>blood culture: Pending  Procedures: None  Consults: None  DVT prophylaxis: IV heparin   Subjective:    Latoya Maulding today continues to feel fatigued and tired-continues to have coughing spells.   Assessment  & Plan :   Acute Hypoxic Resp Failure due to Covid 19 Viral pneumonia: Mild hypoxemia-titrated to room air this morning-remains on steroids and Remdesivir.  Continue  supportive care.  Fever: afebrile O2 requirements:  SpO2: 96 % O2 Flow Rate (L/min): 2 L/min FiO2 (%): 100 %   COVID-19 Labs: Recent Labs    09/01/20 2230  DDIMER 2.22*  FERRITIN 1,478*  LDH 582*  CRP 5.8*    No results found for: BNP  Recent Labs  Lab 09/01/20 2230  PROCALCITON 0.16    Lab Results  Component Value Date   SARSCOV2NAA POSITIVE (A) 08/28/2020   [place] Not Detected 01/18/2020    Prone/Incentive Spirometry: encouraged  incentive spirometry use 3-4/hour.  Elevated D-dimer: Likely secondary to  COVID-19-awaiting Doppler/VQ scan-on IV heparin until VTE rule out.  AKI: Hemodynamically mediated-likely secondary to COVID-19-use of ARB/HCTZ-improving with supportive care.  Avoid nephrotoxic agents.  Hypokalemia: Replete and recheck.  Transaminitis: Secondary to COVID-19-downtrending-follow periodically.  HTN: BP relatively stable-continue Coreg, amlodipine-ARB/diuretics on hold given AKI.  Bronchial asthma: No flare-continue inhaler regimen.  GERD: Continue PPI  Rheumatoid arthritis: Continue hydroxychloroquine-follow.  Depression/anxiety: Stable but could have worsened slightly due to grief (mother passed away recently) -continue Effexor  Debility/deconditioning/fatigue/malaise-ambulate with nursing staff/PT and see how she does.  Morbid obesity Estimated body mass index is 49.35 kg/m as calculated from the following:   Height as of this encounter: 5\' 10"  (1.778 m).   Weight as of this encounter: 156 kg.    ABG: No results found for: PHART, PCO2ART, PO2ART, HCO3, TCO2, ACIDBASEDEF, O2SAT  Vent Settings:  FiO2 (%):  [100 %] 100 %  Condition -Stable  Family Communication  :  Sister Inez Catalina [phone]) updated over the phone 9/20  Code Status :  Full Code  Diet :  Diet Order            Diet Heart Room service appropriate? Yes; Fluid consistency: Thin  Diet effective now                  Disposition Plan  :   Status is:  Inpatient  Remains inpatient appropriate because:Inpatient level of care appropriate due to severity of illness   Dispo: The patient is from: Home              Anticipated d/c is to: Home              Anticipated d/c date is: 2 days              Patient currently is not medically stable to d/c.   Barriers to discharge: Hypoxia requiring O2 supplementation/complete 5 days of IV Remdesivir  Antimicorbials  :    Anti-infectives (From admission, onward)   Start     Dose/Rate Route Frequency Ordered Stop   09/03/20 1000  remdesivir 100 mg in sodium chloride 0.9 % 100 mL IVPB  Status:  Discontinued       "Followed by" Linked Group Details   100 mg 200 mL/hr over 30 Minutes Intravenous Daily 09/02/20 0233 09/02/20 0235   09/03/20 1000  remdesivir 100 mg in sodium chloride 0.9 % 100 mL IVPB        100 mg 200 mL/hr over 30 Minutes Intravenous Daily 09/02/20 0123 09/07/20 0959   09/02/20 0233  remdesivir 200 mg in sodium chloride 0.9% 250 mL IVPB  Status:  Discontinued       "Followed by" Linked Group Details   200 mg 580 mL/hr over 30 Minutes Intravenous Once 09/02/20 0233 09/02/20 0235   09/02/20 0130  remdesivir 200 mg in sodium chloride 0.9% 250 mL IVPB        200 mg 580 mL/hr over 30 Minutes Intravenous Once 09/02/20 0123 09/02/20 0452      Inpatient Medications  Scheduled Meds: . albuterol  2 puff Inhalation Q6H  . amLODipine  2.5 mg Oral Daily  . vitamin C  500 mg Oral Daily  . benzonatate  200 mg Oral TID  . carvedilol  6.25 mg Oral BID WC  . mometasone-formoterol  2 puff Inhalation BID  . montelukast  10 mg Oral QHS  . [START ON 09/05/2020] predniSONE  50 mg Oral Daily  . triamcinolone acetonide  10 mg Other Once  .  zinc sulfate  220 mg Oral Daily   Continuous Infusions: . heparin 1,600 Units/hr (09/02/20 0750)  . lactated ringers 100 mL/hr at 09/02/20 0751  . methylPREDNISolone (SOLU-MEDROL) injection Stopped (09/02/20 7253)  . [START ON 09/03/2020] remdesivir 100 mg  in NS 100 mL     PRN Meds:.acetaminophen, guaiFENesin-dextromethorphan, loperamide, ondansetron **OR** ondansetron (ZOFRAN) IV, tiZANidine, zolpidem   Time Spent in minutes  25  See all Orders from today for further details   Oren Binet M.D on 09/02/2020 at 11:02 AM  To page go to [email or web address] - use universal password  [facility] -  Office  [phone]    Objective:   Vitals:   09/02/20 0430 09/02/20 0615 09/02/20 0700 09/02/20 0747  BP: 115/73 136/82 (!) 104/91 118/79  Pulse: (!) 110 (!) 106 98 (!) 101  Resp: (!) 25 (!) 24 (!) 8 14  Temp:    98.2 F (36.8 C)  TempSrc:    Oral  SpO2: 99% 95% 98% 96%  Weight:      Height:        Wt Readings from Last 3 Encounters:  09/02/20 (!) 156 kg  08/28/20 (!) 156.5 kg  09/28/18 (!) 164.7 kg    No intake or output data in the 24 hours ending 09/02/20 1102   Physical Exam Gen Exam:Alert awake-not in any distress HEENT:atraumatic, normocephalic Chest: B/L clear to auscultation anteriorly CVS:S1S2 regular Abdomen:soft non tender, non distended Extremities:no edema Neurology: Non focal Skin: no rash   Data Review:    CBC Recent Labs  Lab 09/01/20 2230 09/02/20 0947  WBC 14.5* 13.6*  HGB 12.6 12.0  HCT 39.1 36.6  PLT 345 348  MCV 98.0 98.4  MCH 31.6 32.3  MCHC 32.2 32.8  RDW 14.6 14.6  LYMPHSABS 1.0 PENDING  MONOABS 1.2* PENDING  EOSABS 0.0 PENDING  BASOSABS 0.1 PENDING    Chemistries  Recent Labs  Lab 09/01/20 2230 09/02/20 0947  NA 139 136  K 3.9 3.2*  CL 102 99  CO2 21* 21*  GLUCOSE 124* 180*  BUN 37* 36*  CREATININE 2.41* 2.15*  CALCIUM 9.0 8.7*  AST 156* 133*  ALT 223* 206*  ALKPHOS 64 63  BILITOT 2.8* 1.6*   ------------------------------------------------------------------------------------------------------------------ Recent Labs    09/01/20 2230  TRIG 238*    Lab Results  Component Value Date   HGBA1C 6.1 03/31/2018    ------------------------------------------------------------------------------------------------------------------ No results for input(s): TSH, T4TOTAL, T3FREE, THYROIDAB in the last 72 hours.  Invalid input(s): FREET3 ------------------------------------------------------------------------------------------------------------------ Recent Labs    09/01/20 2230  FERRITIN 1,478*    Coagulation profile No results for input(s): INR, PROTIME in the last 168 hours.  Recent Labs    09/01/20 2230  DDIMER 2.22*    Cardiac Enzymes No results for input(s): CKMB, TROPONINI, MYOGLOBIN in the last 168 hours.  Invalid input(s): CK ------------------------------------------------------------------------------------------------------------------ No results found for: BNP  Micro Results Recent Results (from the past 240 hour(s))  SARS CORONAVIRUS 2 (TAT 6-24 HRS) Nasopharyngeal Nasopharyngeal Swab     Status: Abnormal   Collection Time: 08/28/20  1:15 PM   Specimen: Nasopharyngeal Swab  Result Value Ref Range Status   SARS Coronavirus 2 POSITIVE (A) NEGATIVE Final    Comment: EMAILED [place] ON 08/29/2020 B Y MESSAN H. (NOTE) SARS-CoV-2 target nucleic acids are DETECTED.  The SARS-CoV-2 RNA is generally detectable in upper and lower respiratory specimens during the acute phase of infection. Positive results are indicative of the presence of SARS-CoV-2 RNA. Clinical correlation with  patient history and other diagnostic information is  necessary to determine patient infection status. Positive results do not rule out bacterial infection or co-infection with other viruses.  The expected result is Negative.  Fact Sheet for Patients: SugarRoll.be  Fact Sheet for Healthcare Providers: https://www.woods-mathews.com/  This test is not yet approved or cleared by the [place] FDA and  has been authorized for detection and/or  diagnosis of SARS-CoV-2 by FDA under an Emergency Use Authorization (EUA). This EUA will remain  in effect (meaning this test can be used) for the duration of the COVID-1 9 declaration under Section 564(b)(1) of the Act, 21 U.S.C. section 360bbb-3(b)(1), unless the authorization is terminated or revoked sooner.   Performed at [facility], [place] [address] [place], [place] [address]     Radiology Reports DG Chest [place] 1 View  Result Date: 09/01/2020 CLINICAL DATA:  COVID, hypoxia, EXAM: PORTABLE CHEST 1 VIEW COMPARISON:  03/12/2016 FINDINGS: Lung volumes are small and there is bibasilar atelectasis. No pneumothorax or pleural effusion. Cardiac size within normal limits. Pulmonary vascularity normal. No acute bone abnormality. IMPRESSION: Bibasilar atelectasis. Electronically Signed   By: Fidela Salisbury MD   On: 09/01/2020 22:58

## 2020-09-03 LAB — CBC WITH DIFFERENTIAL/PLATELET
Abs Immature Granulocytes: 1.55 10*3/uL — ABNORMAL HIGH (ref 0.00–0.07)
Basophils Absolute: 0.1 10*3/uL (ref 0.0–0.1)
Basophils Relative: 0 %
Eosinophils Absolute: 0 10*3/uL (ref 0.0–0.5)
Eosinophils Relative: 0 %
HCT: 32.6 % — ABNORMAL LOW (ref 36.0–46.0)
Hemoglobin: 10.7 g/dL — ABNORMAL LOW (ref 12.0–15.0)
Immature Granulocytes: 7 %
Lymphocytes Relative: 6 %
Lymphs Abs: 1.3 10*3/uL (ref 0.7–4.0)
MCH: 31.5 pg (ref 26.0–34.0)
MCHC: 32.8 g/dL (ref 30.0–36.0)
MCV: 95.9 fL (ref 80.0–100.0)
Monocytes Absolute: 1.2 10*3/uL — ABNORMAL HIGH (ref 0.1–1.0)
Monocytes Relative: 5 %
Neutro Abs: 17.8 10*3/uL — ABNORMAL HIGH (ref 1.7–7.7)
Neutrophils Relative %: 82 %
Platelets: 330 10*3/uL (ref 150–400)
RBC: 3.4 MIL/uL — ABNORMAL LOW (ref 3.87–5.11)
RDW: 14.4 % (ref 11.5–15.5)
WBC: 21.9 10*3/uL — ABNORMAL HIGH (ref 4.0–10.5)
nRBC: 0 % (ref 0.0–0.2)

## 2020-09-03 LAB — COMPREHENSIVE METABOLIC PANEL
ALT: 179 U/L — ABNORMAL HIGH (ref 0–44)
AST: 96 U/L — ABNORMAL HIGH (ref 15–41)
Albumin: 3 g/dL — ABNORMAL LOW (ref 3.5–5.0)
Alkaline Phosphatase: 56 U/L (ref 38–126)
Anion gap: 15 (ref 5–15)
BUN: 38 mg/dL — ABNORMAL HIGH (ref 6–20)
CO2: 20 mmol/L — ABNORMAL LOW (ref 22–32)
Calcium: 8.9 mg/dL (ref 8.9–10.3)
Chloride: 101 mmol/L (ref 98–111)
Creatinine, Ser: 1.79 mg/dL — ABNORMAL HIGH (ref 0.44–1.00)
GFR calc Af Amer: 38 mL/min — ABNORMAL LOW (ref >60)
GFR calc non Af Amer: 33 mL/min — ABNORMAL LOW (ref >60)
Glucose, Bld: 127 mg/dL — ABNORMAL HIGH (ref 70–99)
Potassium: 3.7 mmol/L (ref 3.5–5.1)
Sodium: 136 mmol/L (ref 135–145)
Total Bilirubin: 1.2 mg/dL (ref 0.3–1.2)
Total Protein: 7.1 g/dL (ref 6.5–8.1)

## 2020-09-03 LAB — C-REACTIVE PROTEIN: CRP: 5.1 mg/dL — ABNORMAL HIGH (ref <1.0)

## 2020-09-03 LAB — D-DIMER, QUANTITATIVE: D-Dimer, Quant: 1.16 ug/mL-FEU — ABNORMAL HIGH (ref 0.00–0.50)

## 2020-09-03 LAB — HEPARIN LEVEL (UNFRACTIONATED): Heparin Unfractionated: 0.47 IU/mL (ref 0.30–0.70)

## 2020-09-03 LAB — FERRITIN: Ferritin: 1337 ng/mL — ABNORMAL HIGH (ref 11–307)

## 2020-09-03 MED ORDER — METHYLPREDNISOLONE SODIUM SUCC 125 MG IJ SOLR
100.0000 mg | Freq: Two times a day (BID) | INTRAMUSCULAR | Status: AC
  Administered 2020-09-03 – 2020-09-04 (×3): 100 mg via INTRAVENOUS
  Filled 2020-09-03 (×3): qty 2

## 2020-09-03 MED ORDER — HEPARIN SODIUM (PORCINE) 10000 UNIT/ML IJ SOLN
7500.0000 [IU] | Freq: Three times a day (TID) | INTRAMUSCULAR | Status: DC
  Administered 2020-09-03 – 2020-09-06 (×8): 7500 [IU] via SUBCUTANEOUS
  Filled 2020-09-03 (×12): qty 1

## 2020-09-03 MED ORDER — PREDNISONE 20 MG PO TABS
50.0000 mg | ORAL_TABLET | Freq: Every day | ORAL | Status: DC
  Administered 2020-09-05 – 2020-09-06 (×2): 50 mg via ORAL
  Filled 2020-09-03 (×2): qty 2

## 2020-09-03 MED ORDER — LORATADINE 10 MG PO TABS
10.0000 mg | ORAL_TABLET | Freq: Every day | ORAL | Status: DC
  Administered 2020-09-03 – 2020-09-06 (×4): 10 mg via ORAL
  Filled 2020-09-03 (×4): qty 1

## 2020-09-03 NOTE — Progress Notes (Signed)
[place] for Heparin Indication: pulmonary embolus, R/O  Assessment: 49 yr old woman with COVID, to start heparin for R/O PE, d-dimer 1.57. She was not on anticoagulation PTA.    VQ scan 9/20: no findings indicative of PE Vascular ultrasound 9/20: no evidence of DVT in either lower extremity; however, portions of the exams were limited Heparin level 0.47 units/ml Hg 10.7, PTLC 330  Goal of Therapy:  Heparin level 0.3-0.7 units/ml Monitor platelets by anticoagulation protocol: Yes   Plan:  Continue heparin at 1400 Monitor daily heparin level, CBC Monitor for signs/symptoms of bleeding  Thanks for allowing pharmacy to be a part of this patient's care.  Excell Seltzer, PharmD Clinical Pharmacist 09/03/2020 3:40 AM

## 2020-09-03 NOTE — Progress Notes (Signed)
PROGRESS NOTE                                                                                                                                                                                                             Patient Demographics:    Brittany Riggs, is a 49 y.o. female, DOB - [birth year], OXB:[id number]  Outpatient Primary MD for the patient is [name], MD   Admit date - 09/01/2020   LOS - 1  Chief Complaint  Patient presents with  . Shortness of Breath       Brief Narrative: Patient is a 49 y.o. female with PMHx of bronchial asthma, rheumatoid arthritis, HTN, morbid obesity-who has been not feeling well for past 3 weeks with fatigue, malaise, cough-developed worsening cough and shortness of breath approximately 3 days prior to this hospital stay-she subsequently presented to the hospital on 9/19 with shortness of breath and was diagnosed with acute hypoxic respiratory failure secondary to COVID-19 pneumonia.   COVID-19 vaccinated status: Vaccinated x1 with Pfizer vaccine in March 2021-subsequently developed anaphylaxis.  Has not yet received second dose.  Significant Events: 9/15>> diagnosed with COVID-19 9/15>> Admit to [facility] for hypoxia due to COVID-19, AKI, transaminitis.  Significant studies: 9/19>>Chest x-ray: Bibasilar atelectasis. 9/20>> VQ scan: Low probability  9/20>> lower extremity Doppler: No DVT  COVID-19 medications: Steroids: 9/19>> Remdesivir: 9/19>>  Antibiotics: None  Microbiology data: 9/20>>blood culture: No growth  Procedures: None  Consults: None  DVT prophylaxis: SQ heparin   Subjective:    No major events overnight-main complaint continues to be fatigue and tiredness.   Assessment  & Plan :   Acute Hypoxic Resp Failure due to Covid 19 Viral pneumonia: Hypoxemia has resolved-on room air this morning.  CRP stable and downtrending.  Remains on tapering steroids  and Remdesivir.  Ambulate with PT and see how she does-continue supportive care.  Fever: afebrile O2 requirements:  SpO2: 94 % O2 Flow Rate (L/min): 2 L/min FiO2 (%): 100 %   COVID-19 Labs: Recent Labs    09/01/20 2230 09/02/20 0947 09/03/20 0118  DDIMER 2.22* 1.57* 1.16*  FERRITIN 1,478*  --  1,337*  LDH 582*  --   --   CRP 5.8* 5.5* 5.1*    No results found for: BNP  Recent Labs  Lab 09/01/20 2230  PROCALCITON 0.16    Lab  Results  Component Value Date   SARSCOV2NAA POSITIVE (A) 08/28/2020   [place] Not Detected 01/18/2020    Prone/Incentive Spirometry: encouraged  incentive spirometry use 3-4/hour.  Elevated D-dimer: Likely secondary to COVID-19-Doppler/VQ scan negative for VTE-transition from IV heparin to SQ heparin.    AKI: Hemodynamically mediated-likely secondary to COVID-19-use of ARB/HCTZ-improving with supportive care.  Avoid nephrotoxic agents.  Hypokalemia: Repleted  Transaminitis: Secondary to COVID-19-downtrending-follow periodically.  HTN: BP relatively stable-continue Coreg, amlodipine-ARB/diuretics on hold given AKI.  Bronchial asthma: No flare-continue inhaler regimen.  GERD: Continue PPI  Rheumatoid arthritis: Continue hydroxychloroquine-follow.  Depression/anxiety: Stable but could have worsened slightly due to grief (mother passed away recently) -continue Effexor  Debility/deconditioning/fatigue/malaise-ambulate with nursing staff/PT and see how she does.  Morbid obesity Estimated body mass index is 49.35 kg/m as calculated from the following:   Height as of this encounter: 5\' 10"  (1.778 m).   Weight as of this encounter: 156 kg.    ABG: No results found for: PHART, PCO2ART, PO2ART, HCO3, TCO2, ACIDBASEDEF, O2SAT  Vent Settings:     Condition -Stable  Family Communication  :  Sister Inez Catalina [phone]) updated over the phone 9/21  Code Status :  Full Code  Diet :  Diet Order            Diet Heart Room service  appropriate? Yes; Fluid consistency: Thin  Diet effective now                  Disposition Plan  :   Status is: Inpatient  Remains inpatient appropriate because:Inpatient level of care appropriate due to severity of illness   Dispo: The patient is from: Home              Anticipated d/c is to: Home              Anticipated d/c date is: 2 days              Patient currently is not medically stable to d/c.   Barriers to discharge: Hypoxia requiring O2 supplementation/complete 5 days of IV Remdesivir  Antimicorbials  :    Anti-infectives (From admission, onward)   Start     Dose/Rate Route Frequency Ordered Stop   09/03/20 1000  remdesivir 100 mg in sodium chloride 0.9 % 100 mL IVPB  Status:  Discontinued       "Followed by" Linked Group Details   100 mg 200 mL/hr over 30 Minutes Intravenous Daily 09/02/20 0233 09/02/20 0235   09/03/20 1000  remdesivir 100 mg in sodium chloride 0.9 % 100 mL IVPB        100 mg 200 mL/hr over 30 Minutes Intravenous Daily 09/02/20 0123 09/07/20 0959   09/02/20 1115  hydroxychloroquine (PLAQUENIL) tablet 400 mg        400 mg Oral Daily 09/02/20 1112     09/02/20 0233  remdesivir 200 mg in sodium chloride 0.9% 250 mL IVPB  Status:  Discontinued       "Followed by" Linked Group Details   200 mg 580 mL/hr over 30 Minutes Intravenous Once 09/02/20 0233 09/02/20 0235   09/02/20 0130  remdesivir 200 mg in sodium chloride 0.9% 250 mL IVPB        200 mg 580 mL/hr over 30 Minutes Intravenous Once 09/02/20 0123 09/02/20 0452      Inpatient Medications  Scheduled Meds: . albuterol  2 puff Inhalation Q6H  . amLODipine  2.5 mg Oral Daily  . vitamin C  500  mg Oral Daily  . benzonatate  200 mg Oral TID  . carvedilol  6.25 mg Oral BID WC  . heparin injection (subcutaneous)  7,500 Units Subcutaneous Q8H  . hydroxychloroquine  400 mg Oral Daily  . influenza vac split quadrivalent PF  0.5 mL Intramuscular Tomorrow-1000  . loratadine  10 mg Oral Daily  .  mometasone-formoterol  2 puff Inhalation BID  . montelukast  10 mg Oral QHS  . pantoprazole  40 mg Oral Daily  . [START ON 09/05/2020] predniSONE  50 mg Oral Daily  . venlafaxine XR  150 mg Oral BID  . zinc sulfate  220 mg Oral Daily   Continuous Infusions: . lactated ringers 75 mL/hr at 09/03/20 0748  . methylPREDNISolone (SOLU-MEDROL) injection 160 mg (09/03/20 0954)  . remdesivir 100 mg in NS 100 mL 100 mg (09/03/20 0849)   PRN Meds:.acetaminophen, alum & mag hydroxide-simeth, guaiFENesin-dextromethorphan, loperamide, ondansetron **OR** ondansetron (ZOFRAN) IV, tiZANidine, zolpidem   Time Spent in minutes  25  See all Orders from today for further details   Oren Binet M.D on 09/03/2020 at 1:21 PM  To page go to [email or web address] - use universal password  [facility] -  Office  [phone]    Objective:   Vitals:   09/02/20 2140 09/02/20 2304 09/03/20 0434 09/03/20 0742  BP:  124/66 132/85 122/76  Pulse: 93 96 98 99  Resp:  20 20 20   Temp:  98.2 F (36.8 C) 98 F (36.7 C) 98.2 F (36.8 C)  TempSrc:  Oral Oral Oral  SpO2: 96% 95% 98% 94%  Weight:      Height:        Wt Readings from Last 3 Encounters:  09/02/20 (!) 156 kg  08/28/20 (!) 156.5 kg  09/28/18 (!) 164.7 kg     Intake/Output Summary (Last 24 hours) at 09/03/2020 1321 Last data filed at 09/03/2020 0700 Gross per 24 hour  Intake 2920.79 ml  Output --  Net 2920.79 ml     Physical Exam Gen Exam:Alert awake-not in any distress HEENT:atraumatic, normocephalic Chest: B/L clear to auscultation anteriorly CVS:S1S2 regular Abdomen:soft non tender, non distended Extremities:no edema Neurology: Non focal Skin: no rash   Data Review:    CBC Recent Labs  Lab 09/01/20 2230 09/02/20 0947 09/03/20 0118  WBC 14.5* 13.6* 21.9*  HGB 12.6 12.0 10.7*  HCT 39.1 36.6 32.6*  PLT 345 348 330  MCV 98.0 98.4 95.9  MCH 31.6 32.3 31.5  MCHC 32.2 32.8 32.8  RDW 14.6 14.6 14.4  LYMPHSABS 1.0 1.0  1.3  MONOABS 1.2* 0.4 1.2*  EOSABS 0.0 0.0 0.0  BASOSABS 0.1 0.0 0.1    Chemistries  Recent Labs  Lab 09/01/20 2230 09/02/20 0947 09/03/20 0118  NA 139 136 136  K 3.9 3.2* 3.7  CL 102 99 101  CO2 21* 21* 20*  GLUCOSE 124* 180* 127*  BUN 37* 36* 38*  CREATININE 2.41* 2.15* 1.79*  CALCIUM 9.0 8.7* 8.9  AST 156* 133* 96*  ALT 223* 206* 179*  ALKPHOS 64 63 56  BILITOT 2.8* 1.6* 1.2   ------------------------------------------------------------------------------------------------------------------ Recent Labs    09/01/20 2230  TRIG 238*    Lab Results  Component Value Date   HGBA1C 6.1 03/31/2018   ------------------------------------------------------------------------------------------------------------------ No results for input(s): TSH, T4TOTAL, T3FREE, THYROIDAB in the last 72 hours.  Invalid input(s): FREET3 ------------------------------------------------------------------------------------------------------------------ Recent Labs    09/01/20 2230 09/03/20 0118  FERRITIN 1,478* 1,337*    Coagulation profile No results for input(s): INR, PROTIME  in the last 168 hours.  Recent Labs    09/02/20 0947 09/03/20 0118  DDIMER 1.57* 1.16*    Cardiac Enzymes No results for input(s): CKMB, TROPONINI, MYOGLOBIN in the last 168 hours.  Invalid input(s): CK ------------------------------------------------------------------------------------------------------------------ No results found for: BNP  Micro Results Recent Results (from the past 240 hour(s))  SARS CORONAVIRUS 2 (TAT 6-24 HRS) Nasopharyngeal Nasopharyngeal Swab     Status: Abnormal   Collection Time: 08/28/20  1:15 PM   Specimen: Nasopharyngeal Swab  Result Value Ref Range Status   SARS Coronavirus 2 POSITIVE (A) NEGATIVE Final    Comment: EMAILED [place] ON 08/29/2020 B Y MESSAN H. (NOTE) SARS-CoV-2 target nucleic acids are DETECTED.  The SARS-CoV-2 RNA is generally detectable in  upper and lower respiratory specimens during the acute phase of infection. Positive results are indicative of the presence of SARS-CoV-2 RNA. Clinical correlation with patient history and other diagnostic information is  necessary to determine patient infection status. Positive results do not rule out bacterial infection or co-infection with other viruses.  The expected result is Negative.  Fact Sheet for Patients: SugarRoll.be  Fact Sheet for Healthcare Providers: https://www.woods-mathews.com/  This test is not yet approved or cleared by the [place] FDA and  has been authorized for detection and/or diagnosis of SARS-CoV-2 by FDA under an Emergency Use Authorization (EUA). This EUA will remain  in effect (meaning this test can be used) for the duration of the COVID-1 9 declaration under Section 564(b)(1) of the Act, 21 U.S.C. section 360bbb-3(b)(1), unless the authorization is terminated or revoked sooner.   Performed at [facility], [place] [address], [place], [place] [address]   Blood Culture (routine x 2)     Status: None (Preliminary result)   Collection Time: 09/02/20  2:17 AM   Specimen: BLOOD  Result Value Ref Range Status   Specimen Description BLOOD LEFT ANTECUBITAL  Final   Special Requests   Final    BOTTLES DRAWN AEROBIC AND ANAEROBIC Blood Culture adequate volume   Culture   Final    NO GROWTH 1 DAY Performed at [facility], [place] [address] [place], [place] [address]    Report Status PENDING  Incomplete  Blood Culture (routine x 2)     Status: None (Preliminary result)   Collection Time: 09/02/20  2:19 AM   Specimen: BLOOD  Result Value Ref Range Status   Specimen Description BLOOD RIGHT ANTECUBITAL  Final   Special Requests   Final    BOTTLES DRAWN AEROBIC AND ANAEROBIC Blood Culture adequate volume   Culture   Final    NO GROWTH 1 DAY Performed at [facility], [place] [address],  [place], [place] [address]    Report Status PENDING  Incomplete    Radiology Reports NM Pulmonary Perfusion  Result Date: 09/02/2020 CLINICAL DATA:  Shortness of breath with positive D-dimer EXAM: NUCLEAR MEDICINE PERFUSION LUNG SCAN TECHNIQUE: Perfusion images were obtained in multiple projections after intravenous injection of radiopharmaceutical Views: Anterior, posterior, left lateral, right lateral, RPO, LPO, RAO, LAO. RADIOPHARMACEUTICALS:  3.8 mCi Tc-38m MAA IV COMPARISON:  Chest radiograph September 01, 2020 FINDINGS: Radiotracer uptake is homogeneous and symmetric bilaterally. No perfusion defects evident. IMPRESSION: No perfusion defects evident. No findings indicative of pulmonary embolus. Electronically Signed   By: Lowella Grip III M.D.   On: 09/02/2020 15:31   DG Chest Port 1 View  Result Date: 09/01/2020 CLINICAL DATA:  COVID, hypoxia, EXAM: PORTABLE CHEST  1 VIEW COMPARISON:  03/12/2016 FINDINGS: Lung volumes are small and there is bibasilar atelectasis. No pneumothorax or pleural effusion. Cardiac size within normal limits. Pulmonary vascularity normal. No acute bone abnormality. IMPRESSION: Bibasilar atelectasis. Electronically Signed   By: Fidela Salisbury MD   On: 09/01/2020 22:58   VAS [place] LOWER EXTREMITY VENOUS (DVT)  Result Date: 09/02/2020  Lower Venous DVT Study Indications: Covid-19, elevated d-dimer, hypoxia.  Limitations: Body habitus and pain with compression. Comparison Study: Prior study from 11/22/17 is available for comparison Performing Technologist: Sharion Dove RVS  Examination Guidelines: A complete evaluation includes B-mode imaging, spectral Doppler, color Doppler, and power Doppler as needed of all accessible portions of each vessel. Bilateral testing is considered an integral part of a complete examination. Limited examinations for reoccurring indications may be performed as noted. The reflux portion of the exam is performed with the patient in reverse  Trendelenburg.  +---------+---------------+---------+-----------+----------+--------------+ RIGHT    CompressibilityPhasicitySpontaneityPropertiesThrombus Aging +---------+---------------+---------+-----------+----------+--------------+ CFV      Full           Yes      Yes                                 +---------+---------------+---------+-----------+----------+--------------+ SFJ      Full                                                        +---------+---------------+---------+-----------+----------+--------------+ FV Prox  Full                                                        +---------+---------------+---------+-----------+----------+--------------+ FV Mid   Full                                                        +---------+---------------+---------+-----------+----------+--------------+ FV DistalFull                                                        +---------+---------------+---------+-----------+----------+--------------+ PFV      Full                                                        +---------+---------------+---------+-----------+----------+--------------+ POP      Full           Yes      Yes                                 +---------+---------------+---------+-----------+----------+--------------+ PTV      Full                                                        +---------+---------------+---------+-----------+----------+--------------+  PERO                                                  Not visualized +---------+---------------+---------+-----------+----------+--------------+   +---------+---------------+---------+-----------+----------+--------------+ LEFT     CompressibilityPhasicitySpontaneityPropertiesThrombus Aging +---------+---------------+---------+-----------+----------+--------------+ CFV      Full           Yes      Yes                                  +---------+---------------+---------+-----------+----------+--------------+ SFJ      Full                                                        +---------+---------------+---------+-----------+----------+--------------+ FV Prox  Full                                                        +---------+---------------+---------+-----------+----------+--------------+ FV Mid   Full                                                        +---------+---------------+---------+-----------+----------+--------------+ FV DistalFull                                                        +---------+---------------+---------+-----------+----------+--------------+ PFV      Full                                                        +---------+---------------+---------+-----------+----------+--------------+ POP      Full           Yes      Yes                                 +---------+---------------+---------+-----------+----------+--------------+ PTV      Full                                                        +---------+---------------+---------+-----------+----------+--------------+ PERO                                                  Not visualized +---------+---------------+---------+-----------+----------+--------------+  Summary: RIGHT: - Findings appear essentially unchanged compared to previous examination. - There is no evidence of deep vein thrombosis in the lower extremity. However, portions of this examination were limited- see technologist comments above.  LEFT: - There is no evidence of deep vein thrombosis in the lower extremity. However, portions of this examination were limited- see technologist comments above.  *See table(s) above for measurements and observations. Electronically signed by Ruta Hinds MD on 09/02/2020 at 5:19:04 PM.    Final
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
## 2020-09-03 NOTE — Progress Notes (Signed)
Patient arrived to unit A/O/V, denies pain. No distress noted. Will continue to monitor.

## 2020-09-04 LAB — URINALYSIS, ROUTINE W REFLEX MICROSCOPIC
Bilirubin Urine: NEGATIVE
Glucose, UA: NEGATIVE mg/dL
Hgb urine dipstick: NEGATIVE
Ketones, ur: NEGATIVE mg/dL
Leukocytes,Ua: NEGATIVE
Nitrite: NEGATIVE
Protein, ur: NEGATIVE mg/dL
Specific Gravity, Urine: 1.02 (ref 1.005–1.030)
pH: 5 (ref 5.0–8.0)

## 2020-09-04 LAB — COMPREHENSIVE METABOLIC PANEL
ALT: 143 U/L — ABNORMAL HIGH (ref 0–44)
AST: 68 U/L — ABNORMAL HIGH (ref 15–41)
Albumin: 2.7 g/dL — ABNORMAL LOW (ref 3.5–5.0)
Alkaline Phosphatase: 53 U/L (ref 38–126)
Anion gap: 13 (ref 5–15)
BUN: 38 mg/dL — ABNORMAL HIGH (ref 6–20)
CO2: 21 mmol/L — ABNORMAL LOW (ref 22–32)
Calcium: 8.6 mg/dL — ABNORMAL LOW (ref 8.9–10.3)
Chloride: 105 mmol/L (ref 98–111)
Creatinine, Ser: 1.78 mg/dL — ABNORMAL HIGH (ref 0.44–1.00)
GFR calc Af Amer: 38 mL/min — ABNORMAL LOW (ref >60)
GFR calc non Af Amer: 33 mL/min — ABNORMAL LOW (ref >60)
Glucose, Bld: 178 mg/dL — ABNORMAL HIGH (ref 70–99)
Potassium: 3.1 mmol/L — ABNORMAL LOW (ref 3.5–5.1)
Sodium: 139 mmol/L (ref 135–145)
Total Bilirubin: 0.9 mg/dL (ref 0.3–1.2)
Total Protein: 6.4 g/dL — ABNORMAL LOW (ref 6.5–8.1)

## 2020-09-04 LAB — CBC WITH DIFFERENTIAL/PLATELET
Abs Immature Granulocytes: 1.4 10*3/uL — ABNORMAL HIGH (ref 0.00–0.07)
Basophils Absolute: 0.1 10*3/uL (ref 0.0–0.1)
Basophils Relative: 0 %
Eosinophils Absolute: 0 10*3/uL (ref 0.0–0.5)
Eosinophils Relative: 0 %
HCT: 30.1 % — ABNORMAL LOW (ref 36.0–46.0)
Hemoglobin: 9.8 g/dL — ABNORMAL LOW (ref 12.0–15.0)
Immature Granulocytes: 6 %
Lymphocytes Relative: 4 %
Lymphs Abs: 0.9 10*3/uL (ref 0.7–4.0)
MCH: 32 pg (ref 26.0–34.0)
MCHC: 32.6 g/dL (ref 30.0–36.0)
MCV: 98.4 fL (ref 80.0–100.0)
Monocytes Absolute: 0.7 10*3/uL (ref 0.1–1.0)
Monocytes Relative: 3 %
Neutro Abs: 21.7 10*3/uL — ABNORMAL HIGH (ref 1.7–7.7)
Neutrophils Relative %: 87 %
Platelets: 350 10*3/uL (ref 150–400)
RBC: 3.06 MIL/uL — ABNORMAL LOW (ref 3.87–5.11)
RDW: 14.6 % (ref 11.5–15.5)
WBC: 24.8 10*3/uL — ABNORMAL HIGH (ref 4.0–10.5)
nRBC: 0 % (ref 0.0–0.2)

## 2020-09-04 LAB — FERRITIN: Ferritin: 1148 ng/mL — ABNORMAL HIGH (ref 11–307)

## 2020-09-04 LAB — C-REACTIVE PROTEIN: CRP: 1.8 mg/dL — ABNORMAL HIGH (ref <1.0)

## 2020-09-04 LAB — D-DIMER, QUANTITATIVE: D-Dimer, Quant: 1.23 ug/mL-FEU — ABNORMAL HIGH (ref 0.00–0.50)

## 2020-09-04 MED ORDER — POTASSIUM CHLORIDE CRYS ER 20 MEQ PO TBCR
40.0000 meq | EXTENDED_RELEASE_TABLET | Freq: Once | ORAL | Status: AC
  Administered 2020-09-04: 40 meq via ORAL
  Filled 2020-09-04: qty 2
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
## 2020-09-04 NOTE — Progress Notes (Signed)
PROGRESS NOTE    Brittany Riggs  LEX:[id number] DOB: [birth year] DOA: 09/01/2020 PCP: [name], MD   Chief Complaint  Patient presents with  . Shortness of Breath    Brief Narrative:  Patient is Brittany Riggs 49 y.o. female with PMHx of bronchial asthma, rheumatoid arthritis, HTN, morbid obesity-who has been not feeling well for past 3 weeks with fatigue, malaise, cough-developed worsening cough and shortness of breath approximately 3 days prior to this hospital stay-she subsequently presented to the hospital on 9/19 with shortness of breath and was diagnosed with acute hypoxic respiratory failure secondary to COVID-19 pneumonia.  Assessment & Plan:   Principal Problem:   Pneumonia due to COVID-19 virus Active Problems:   Severe obesity (BMI >= 40) (HCC)   Not well controlled moderate persistent asthma   AKI (acute kidney injury) ([place])   Hypoxia   Essential hypertension   Dehydration  Acute Hypoxic Resp Failure due to Covid 19 Viral pneumonia:  Partially vaccinated due to anaphylaxis after 1 dose of pfizer in march 2021 CXR with bibasilar atelectasis Currently on RA, continues to have SOB with minimal exertion Continue steroids, remdesivir Strict I/O, daily weights Prone as able, OOB, IS, flutter  COVID-19 Labs  Recent Labs    09/01/20 2230 09/01/20 2230 09/02/20 0947 09/03/20 0118 09/04/20 1130  DDIMER 2.22*   < > 1.57* 1.16* 1.23*  FERRITIN 1,478*  --   --  1,337* 1,148*  LDH 582*  --   --   --   --   CRP 5.8*   < > 5.5* 5.1* 1.8*   < > = values in this interval not displayed.    Lab Results  Component Value Date   SARSCOV2NAA POSITIVE (Keyshia Orwick) 08/28/2020   [place] Not Detected 01/18/2020   Elevated D-dimer: Likely secondary to COVID-19-Doppler/VQ scan negative for VTE Continue DVT ppx  AKI: Hemodynamically mediated-likely secondary to COVID-19-use of ARB/HCTZ-improving with supportive care.  Avoid nephrotoxic agents. Improved, follow UA Follow renal  US Unclear baseline - creatinine in 08/2016 was <1, follow with IVF  Hypokalemia: replace and follow  Transaminitis: Secondary to COVID-19-downtrending-follow periodically.  HTN: BP relatively stable-continue Coreg, amlodipine-ARB/diuretics on hold given AKI.  Bronchial asthma: No flare-continue inhaler regimen.  GERD: Continue PPI  Rheumatoid arthritis: Continue hydroxychloroquine-follow.  Depression/anxiety: Stable but could have worsened slightly due to grief (mother passed away recently) -continue Effexor  Debility/deconditioning/fatigue/malaise-ambulate with nursing staff/PT and see how she does.  Morbid obesity Body mass index is 49.35 kg/m.  DVT prophylaxis: heparin Code Status:full  Family Communication: none at bedside Disposition:   Status is: Inpatient  Remains inpatient appropriate because:Inpatient level of care appropriate due to severity of illness   Dispo: The patient is from: Home              Anticipated d/c is to: Home              Anticipated d/c date is: > 3 days              Patient currently is not medically stable to d/c.   Consultants:   none  Procedures:  LE [place] Summary:  RIGHT:  - Findings appear essentially unchanged compared to previous examination.  - There is no evidence of deep vein thrombosis in the lower extremity.  However, portions of this examination were limited- see technologist  comments above.    LEFT:  - There is no evidence of deep vein thrombosis in the lower extremity.  However, portions of this examination were  limited- see technologist  comments above.   Antimicrobials:  Anti-infectives (From admission, onward)   Start     Dose/Rate Route Frequency Ordered Stop   09/03/20 1000  remdesivir 100 mg in sodium chloride 0.9 % 100 mL IVPB  Status:  Discontinued       "Followed by" Linked Group Details   100 mg 200 mL/hr over 30 Minutes Intravenous Daily 09/02/20 0233 09/02/20 0235   09/03/20 1000   remdesivir 100 mg in sodium chloride 0.9 % 100 mL IVPB        100 mg 200 mL/hr over 30 Minutes Intravenous Daily 09/02/20 0123 09/07/20 0959   09/02/20 1115  hydroxychloroquine (PLAQUENIL) tablet 400 mg        400 mg Oral Daily 09/02/20 1112     09/02/20 0233  remdesivir 200 mg in sodium chloride 0.9% 250 mL IVPB  Status:  Discontinued       "Followed by" Linked Group Details   200 mg 580 mL/hr over 30 Minutes Intravenous Once 09/02/20 0233 09/02/20 0235   09/02/20 0130  remdesivir 200 mg in sodium chloride 0.9% 250 mL IVPB        200 mg 580 mL/hr over 30 Minutes Intravenous Once 09/02/20 0123 09/02/20 0452         Subjective: C/o SOB with minimal exertion  Objective: Vitals:   09/03/20 1949 09/04/20 0358 09/04/20 0732 09/04/20 1538  BP: 133/69 (!) 141/61 139/69 140/72  Pulse: 94 87 79 79  Resp: 20 (!) 25 19 18   Temp: 97.9 F (36.6 C) 98.3 F (36.8 C) 98.2 F (36.8 C) 98.3 F (36.8 C)  TempSrc: Oral Oral Oral Oral  SpO2: 96% 93% 95% 94%  Weight:      Height:        Intake/Output Summary (Last 24 hours) at 09/04/2020 1931 Last data filed at 09/04/2020 1330 Gross per 24 hour  Intake 1543.15 ml  Output --  Net 1543.15 ml   Filed Weights   09/02/20 0040  Weight: (!) 156 kg    Examination:  General exam: Appears calm and comfortable  Respiratory system: frequent cough, appears visibly SOB, distant breath sounds Cardiovascular system: S1 & S2 heard, RRR.  Gastrointestinal system: Abdomen is nondistended, soft and nontender.  Central nervous system: Alert and oriented. No focal neurological deficits. Extremities: no LEE Skin: No rashes, lesions or ulcers Psychiatry: Judgement and insight appear normal. Mood & affect appropriate.     Data Reviewed: I have personally reviewed following labs and imaging studies  CBC: Recent Labs  Lab 09/01/20 2230 09/02/20 0947 09/03/20 0118 09/04/20 1130  WBC 14.5* 13.6* 21.9* 24.8*  NEUTROABS 10.2* 10.6* 17.8* 21.7*  HGB  12.6 12.0 10.7* 9.8*  HCT 39.1 36.6 32.6* 30.1*  MCV 98.0 98.4 95.9 98.4  PLT 345 348 330 662    Basic Metabolic Panel: Recent Labs  Lab 09/01/20 2230 09/02/20 0947 09/03/20 0118 09/04/20 1130  NA 139 136 136 139  K 3.9 3.2* 3.7 3.1*  CL 102 99 101 105  CO2 21* 21* 20* 21*  GLUCOSE 124* 180* 127* 178*  BUN 37* 36* 38* 38*  CREATININE 2.41* 2.15* 1.79* 1.78*  CALCIUM 9.0 8.7* 8.9 8.6*    GFR: Estimated Creatinine Clearance: 62.5 mL/min (Johonna Binette) (by C-G formula based on SCr of 1.78 mg/dL (H)).  Liver Function Tests: Recent Labs  Lab 09/01/20 2230 09/02/20 0947 09/03/20 0118 09/04/20 1130  AST 156* 133* 96* 68*  ALT 223* 206* 179* 143*  ALKPHOS 64 63  56 53  BILITOT 2.8* 1.6* 1.2 0.9  PROT 7.9 7.5 7.1 6.4*  ALBUMIN 3.4* 3.1* 3.0* 2.7*    CBG: No results for input(s): GLUCAP in the last 168 hours.   Recent Results (from the past 240 hour(s))  SARS CORONAVIRUS 2 (TAT 6-24 HRS) Nasopharyngeal Nasopharyngeal Swab     Status: Abnormal   Collection Time: 08/28/20  1:15 PM   Specimen: Nasopharyngeal Swab  Result Value Ref Range Status   SARS Coronavirus 2 POSITIVE (Kimyatta Lecy) NEGATIVE Final    Comment: EMAILED [place] ON 08/29/2020 B Y MESSAN H. (NOTE) SARS-CoV-2 target nucleic acids are DETECTED.  The SARS-CoV-2 RNA is generally detectable in upper and lower respiratory specimens during the acute phase of infection. Positive results are indicative of the presence of SARS-CoV-2 RNA. Clinical correlation with patient history and other diagnostic information is  necessary to determine patient infection status. Positive results do not rule out bacterial infection or co-infection with other viruses.  The expected result is Negative.  Fact Sheet for Patients: SugarRoll.be  Fact Sheet for Healthcare Providers: https://www.woods-mathews.com/  This test is not yet approved or cleared by the [place] FDA and  has been  authorized for detection and/or diagnosis of SARS-CoV-2 by FDA under an Emergency Use Authorization (EUA). This EUA will remain  in effect (meaning this test can be used) for the duration of the COVID-1 9 declaration under Section 564(b)(1) of the Act, 21 U.S.C. section 360bbb-3(b)(1), unless the authorization is terminated or revoked sooner.   Performed at [facility], [place] [address] [place], [place] [address]   Blood Culture (routine x 2)     Status: None (Preliminary result)   Collection Time: 09/02/20  2:17 AM   Specimen: BLOOD  Result Value Ref Range Status   Specimen Description BLOOD LEFT ANTECUBITAL  Final   Special Requests   Final    BOTTLES DRAWN AEROBIC AND ANAEROBIC Blood Culture adequate volume   Culture   Final    NO GROWTH 2 DAYS Performed at [facility], [place] [address], [place], [place] [address]    Report Status PENDING  Incomplete  Blood Culture (routine x 2)     Status: None (Preliminary result)   Collection Time: 09/02/20  2:19 AM   Specimen: BLOOD  Result Value Ref Range Status   Specimen Description BLOOD RIGHT ANTECUBITAL  Final   Special Requests   Final    BOTTLES DRAWN AEROBIC AND ANAEROBIC Blood Culture adequate volume   Culture   Final    NO GROWTH 2 DAYS Performed at [facility], [place] [address], [place], [place] [address]    Report Status PENDING  Incomplete         Radiology Studies: No results found.      Scheduled Meds: . albuterol  2 puff Inhalation Q6H  . amLODipine  2.5 mg Oral Daily  . vitamin C  500 mg Oral Daily  . benzonatate  200 mg Oral TID  . carvedilol  6.25 mg Oral BID WC  . heparin injection (subcutaneous)  7,500 Units Subcutaneous Q8H  . hydroxychloroquine  400 mg Oral Daily  . influenza vac split quadrivalent PF  0.5 mL Intramuscular Tomorrow-1000  . loratadine  10 mg Oral Daily  . methylPREDNISolone (SOLU-MEDROL) injection  100 mg Intravenous Q12H   Followed by  . [START ON 09/05/2020]  predniSONE  50 mg Oral Daily  . mometasone-formoterol  2 puff Inhalation BID  . montelukast  10 mg Oral QHS  . pantoprazole  40 mg Oral Daily  . venlafaxine XR  150 mg Oral BID  . zinc sulfate  220 mg Oral Daily   Continuous Infusions: . lactated ringers 75 mL/hr at 09/04/20 0904  . remdesivir 100 mg in NS 100 mL 100 mg (09/04/20 0911)     LOS: 2 days    Time spent: over 24 min    Fayrene Helper, MD [facility]   To contact the attending provider between 7A-7P or the covering provider during after hours 7P-7A, please log into the web site [email or web address] and access using universal [place] password for that web site. If you do not have the password, please call the hospital operator.  09/04/2020, 7:31 PM

## 2020-09-05 ENCOUNTER — Inpatient Hospital Stay (HOSPITAL_COMMUNITY): Payer: 59

## 2020-09-05 LAB — CBC WITH DIFFERENTIAL/PLATELET
Abs Immature Granulocytes: 1.89 10*3/uL — ABNORMAL HIGH (ref 0.00–0.07)
Basophils Absolute: 0.1 10*3/uL (ref 0.0–0.1)
Basophils Relative: 0 %
Eosinophils Absolute: 0 10*3/uL (ref 0.0–0.5)
Eosinophils Relative: 0 %
HCT: 29.7 % — ABNORMAL LOW (ref 36.0–46.0)
Hemoglobin: 9.8 g/dL — ABNORMAL LOW (ref 12.0–15.0)
Immature Granulocytes: 7 %
Lymphocytes Relative: 5 %
Lymphs Abs: 1.2 10*3/uL (ref 0.7–4.0)
MCH: 32 pg (ref 26.0–34.0)
MCHC: 33 g/dL (ref 30.0–36.0)
MCV: 97.1 fL (ref 80.0–100.0)
Monocytes Absolute: 1.4 10*3/uL — ABNORMAL HIGH (ref 0.1–1.0)
Monocytes Relative: 5 %
Neutro Abs: 21.5 10*3/uL — ABNORMAL HIGH (ref 1.7–7.7)
Neutrophils Relative %: 83 %
Platelets: 380 10*3/uL (ref 150–400)
RBC: 3.06 MIL/uL — ABNORMAL LOW (ref 3.87–5.11)
RDW: 14.6 % (ref 11.5–15.5)
WBC: 26 10*3/uL — ABNORMAL HIGH (ref 4.0–10.5)
nRBC: 0 % (ref 0.0–0.2)

## 2020-09-05 LAB — FERRITIN: Ferritin: 964 ng/mL — ABNORMAL HIGH (ref 11–307)

## 2020-09-05 LAB — COMPREHENSIVE METABOLIC PANEL
ALT: 136 U/L — ABNORMAL HIGH (ref 0–44)
AST: 64 U/L — ABNORMAL HIGH (ref 15–41)
Albumin: 2.6 g/dL — ABNORMAL LOW (ref 3.5–5.0)
Alkaline Phosphatase: 49 U/L (ref 38–126)
Anion gap: 9 (ref 5–15)
BUN: 36 mg/dL — ABNORMAL HIGH (ref 6–20)
CO2: 21 mmol/L — ABNORMAL LOW (ref 22–32)
Calcium: 8.4 mg/dL — ABNORMAL LOW (ref 8.9–10.3)
Chloride: 109 mmol/L (ref 98–111)
Creatinine, Ser: 1.53 mg/dL — ABNORMAL HIGH (ref 0.44–1.00)
GFR calc Af Amer: 46 mL/min — ABNORMAL LOW (ref >60)
GFR calc non Af Amer: 40 mL/min — ABNORMAL LOW (ref >60)
Glucose, Bld: 150 mg/dL — ABNORMAL HIGH (ref 70–99)
Potassium: 3.5 mmol/L (ref 3.5–5.1)
Sodium: 139 mmol/L (ref 135–145)
Total Bilirubin: 0.5 mg/dL (ref 0.3–1.2)
Total Protein: 5.9 g/dL — ABNORMAL LOW (ref 6.5–8.1)

## 2020-09-05 LAB — D-DIMER, QUANTITATIVE: D-Dimer, Quant: 1.31 ug/mL-FEU — ABNORMAL HIGH (ref 0.00–0.50)

## 2020-09-05 LAB — C-REACTIVE PROTEIN: CRP: 1 mg/dL — ABNORMAL HIGH (ref <1.0)

## 2020-09-05 NOTE — Progress Notes (Signed)
PROGRESS NOTE    Brittany Riggs  JJK:[id number] DOB: 07-18-1971 DOA: 09/01/2020 PCP: [name], MD   Chief Complaint  Patient presents with  . Shortness of Breath    Brief Narrative:  Patient is Brittany Riggs 49 y.o. female with PMHx of bronchial asthma, rheumatoid arthritis, HTN, morbid obesity-who has been not feeling well for past 3 weeks with fatigue, malaise, cough-developed worsening cough and shortness of breath approximately 3 days prior to this hospital stay-she subsequently presented to the hospital on 9/19 with shortness of breath and was diagnosed with acute hypoxic respiratory failure secondary to COVID-19 pneumonia.  Assessment & Plan:   Principal Problem:   Pneumonia due to COVID-19 virus Active Problems:   Severe obesity (BMI >= 40) (HCC)   Not well controlled moderate persistent asthma   AKI (acute kidney injury) ([place])   Hypoxia   Essential hypertension   Dehydration  Acute Hypoxic Resp Failure due to Covid 19 Viral pneumonia:  Partially vaccinated due to anaphylaxis after 1 dose of pfizer in march 2021 CXR with bibasilar atelectasis Currently on RA, continues to have SOB with minimal exertion Continue steroids, remdesivir (will plan for discharge tomorrow after she completes her last dose of remdesivir) Strict I/O, daily weights Prone as able, OOB, IS, flutter  COVID-19 Labs  Recent Labs    09/03/20 0118 09/04/20 1130 09/05/20 0907  DDIMER 1.16* 1.23* 1.31*  FERRITIN 1,337* 1,148* 964*  CRP 5.1* 1.8* 1.0*    Lab Results  Component Value Date   SARSCOV2NAA POSITIVE (Bonifacio Pruden) 08/28/2020   [place] Not Detected 01/18/2020   Elevated D-dimer: Likely secondary to COVID-19-Doppler/VQ scan negative for VTE Continue DVT ppx  Leukocytosis: 2/2 steroids, follow  AKI: Hemodynamically mediated-likely secondary to COVID-19-use of ARB/HCTZ-improving with supportive care.  Avoid nephrotoxic agents. Improving, follow UA (bland) Follow renal US (negative for  hydro) Unclear baseline - creatinine in 08/2016 was <1, follow with IVF  Hypokalemia: replace and follow  Transaminitis: Secondary to COVID-19-downtrending-follow periodically.  Follow acute hepatitis panel.   HTN: BP relatively stable-continue Coreg, amlodipine-ARB/diuretics on hold given AKI.  Bronchial asthma: No flare-continue inhaler regimen.  GERD: Continue PPI  Rheumatoid arthritis: Continue hydroxychloroquine-follow.  Depression/anxiety: Stable but could have worsened slightly due to grief (mother passed away recently) -continue Effexor  Debility/deconditioning/fatigue/malaise-ambulate with nursing staff/PT and see how she does.  Morbid obesity Body mass index is 49.35 kg/m.  DVT prophylaxis: heparin Code Status:full  Family Communication: none at bedside Disposition:   Status is: Inpatient  Remains inpatient appropriate because:Inpatient level of care appropriate due to severity of illness   Dispo: The patient is from: Home              Anticipated d/c is to: Home              Anticipated d/c date is: > 3 days              Patient currently is not medically stable to d/c.   Consultants:   none  Procedures:  LE [place] Summary:  RIGHT:  - Findings appear essentially unchanged compared to previous examination.  - There is no evidence of deep vein thrombosis in the lower extremity.  However, portions of this examination were limited- see technologist  comments above.    LEFT:  - There is no evidence of deep vein thrombosis in the lower extremity.  However, portions of this examination were limited- see technologist  comments above.   Antimicrobials:  Anti-infectives (From admission, onward)   Start  Dose/Rate Route Frequency Ordered Stop   09/03/20 1000  remdesivir 100 mg in sodium chloride 0.9 % 100 mL IVPB  Status:  Discontinued       "Followed by" Linked Group Details   100 mg 200 mL/hr over 30 Minutes Intravenous Daily 09/02/20 0233  09/02/20 0235   09/03/20 1000  remdesivir 100 mg in sodium chloride 0.9 % 100 mL IVPB        100 mg 200 mL/hr over 30 Minutes Intravenous Daily 09/02/20 0123 09/07/20 0959   09/02/20 1115  hydroxychloroquine (PLAQUENIL) tablet 400 mg        400 mg Oral Daily 09/02/20 1112     09/02/20 0233  remdesivir 200 mg in sodium chloride 0.9% 250 mL IVPB  Status:  Discontinued       "Followed by" Linked Group Details   200 mg 580 mL/hr over 30 Minutes Intravenous Once 09/02/20 0233 09/02/20 0235   09/02/20 0130  remdesivir 200 mg in sodium chloride 0.9% 250 mL IVPB        200 mg 580 mL/hr over 30 Minutes Intravenous Once 09/02/20 0123 09/02/20 0452         Subjective: Continued SOB and cough  Objective: Vitals:   09/04/20 1933 09/05/20 0346 09/05/20 0749 09/05/20 1120  BP: 114/63 137/87 (!) 151/83   Pulse: 86 92    Resp: 19 20    Temp: 98.3 F (36.8 C) 98.3 F (36.8 C) 98.3 F (36.8 C) 98.1 F (36.7 C)  TempSrc: Oral Oral Oral Oral  SpO2: 100% 95%    Weight:      Height:        Intake/Output Summary (Last 24 hours) at 09/05/2020 1525 Last data filed at 09/05/2020 0900 Gross per 24 hour  Intake 2988.23 ml  Output 250 ml  Net 2738.23 ml   Filed Weights   09/02/20 0040  Weight: (!) 156 kg    Examination:  General: No acute distress. Cardiovascular: Heart sounds show Adelaide Pfefferkorn regular rate, and rhythm. Lungs: Clear to auscultation bilaterally Abdomen: Soft, nontender, nondistended  Neurological: Alert and oriented 3. Moves all extremities 4 . Cranial nerves II through XII grossly intact. Skin: Warm and dry. No rashes or lesions. Extremities: No clubbing or cyanosis. No edema.   Data Reviewed: I have personally reviewed following labs and imaging studies  CBC: Recent Labs  Lab 09/01/20 2230 09/02/20 0947 09/03/20 0118 09/04/20 1130 09/05/20 0907  WBC 14.5* 13.6* 21.9* 24.8* 26.0*  NEUTROABS 10.2* 10.6* 17.8* 21.7* 21.5*  HGB 12.6 12.0 10.7* 9.8* 9.8*  HCT 39.1 36.6  32.6* 30.1* 29.7*  MCV 98.0 98.4 95.9 98.4 97.1  PLT 345 348 330 350 161    Basic Metabolic Panel: Recent Labs  Lab 09/01/20 2230 09/02/20 0947 09/03/20 0118 09/04/20 1130 09/05/20 0907  NA 139 136 136 139 139  K 3.9 3.2* 3.7 3.1* 3.5  CL 102 99 101 105 109  CO2 21* 21* 20* 21* 21*  GLUCOSE 124* 180* 127* 178* 150*  BUN 37* 36* 38* 38* 36*  CREATININE 2.41* 2.15* 1.79* 1.78* 1.53*  CALCIUM 9.0 8.7* 8.9 8.6* 8.4*    GFR: Estimated Creatinine Clearance: 72.7 mL/min ([place]) (by C-G formula based on SCr of 1.53 mg/dL (H)).  Liver Function Tests: Recent Labs  Lab 09/01/20 2230 09/02/20 0947 09/03/20 0118 09/04/20 1130 09/05/20 0907  AST 156* 133* 96* 68* 64*  ALT 223* 206* 179* 143* 136*  ALKPHOS 64 63 56 53 49  BILITOT 2.8* 1.6* 1.2 0.9 0.5  PROT 7.9 7.5 7.1 6.4* 5.9*  ALBUMIN 3.4* 3.1* 3.0* 2.7* 2.6*    CBG: No results for input(s): GLUCAP in the last 168 hours.   Recent Results (from the past 240 hour(s))  SARS CORONAVIRUS 2 (TAT 6-24 HRS) Nasopharyngeal Nasopharyngeal Swab     Status: Abnormal   Collection Time: 08/28/20  1:15 PM   Specimen: Nasopharyngeal Swab  Result Value Ref Range Status   SARS Coronavirus 2 POSITIVE (Jaelon Gatley) NEGATIVE Final    Comment: EMAILED [place] ON 08/29/2020 B Y MESSAN H. (NOTE) SARS-CoV-2 target nucleic acids are DETECTED.  The SARS-CoV-2 RNA is generally detectable in upper and lower respiratory specimens during the acute phase of infection. Positive results are indicative of the presence of SARS-CoV-2 RNA. Clinical correlation with patient history and other diagnostic information is  necessary to determine patient infection status. Positive results do not rule out bacterial infection or co-infection with other viruses.  The expected result is Negative.  Fact Sheet for Patients: SugarRoll.be  Fact Sheet for Healthcare Providers: https://www.woods-mathews.com/  This test is not  yet approved or cleared by the [place] FDA and  has been authorized for detection and/or diagnosis of SARS-CoV-2 by FDA under an Emergency Use Authorization (EUA). This EUA will remain  in effect (meaning this test can be used) for the duration of the COVID-1 9 declaration under Section 564(b)(1) of the Act, 21 U.S.C. section 360bbb-3(b)(1), unless the authorization is terminated or revoked sooner.   Performed at [facility], [place] [address] [place], [place] [address]   Blood Culture (routine x 2)     Status: None (Preliminary result)   Collection Time: 09/02/20  2:17 AM   Specimen: BLOOD  Result Value Ref Range Status   Specimen Description BLOOD LEFT ANTECUBITAL  Final   Special Requests   Final    BOTTLES DRAWN AEROBIC AND ANAEROBIC Blood Culture adequate volume   Culture   Final    NO GROWTH 3 DAYS Performed at [facility], [place] [address] [place], [place] [address]    Report Status PENDING  Incomplete  Blood Culture (routine x 2)     Status: None (Preliminary result)   Collection Time: 09/02/20  2:19 AM   Specimen: BLOOD  Result Value Ref Range Status   Specimen Description BLOOD RIGHT ANTECUBITAL  Final   Special Requests   Final    BOTTLES DRAWN AEROBIC AND ANAEROBIC Blood Culture adequate volume   Culture   Final    NO GROWTH 3 DAYS Performed at [facility], [place] [employer] [address] [place], [place] [address]    Report Status PENDING  Incomplete         Radiology Studies: US RENAL  Result Date: 09/05/2020 CLINICAL DATA:  Acute kidney injury/insufficiency EXAM: RENAL / URINARY TRACT ULTRASOUND COMPLETE COMPARISON:  None. FINDINGS: Right Kidney: Renal measurements: 12.3 x 5.2 x 5.9 = volume: 199.4 mL. Echogenicity within normal limits. No mass or hydronephrosis visualized. Left Kidney: Renal measurements: 11.1 x 5.5 x 5.7 = volume: 183.2 mL. Echogenicity within normal limits. No mass or hydronephrosis visualized. Bladder: Nondistended, postvoid.  Other: None. IMPRESSION: 1. Negative.  No hydronephrosis. Electronically Signed   By: Lucrezia Europe M.D.   On: 09/05/2020 10:06        Scheduled Meds: . albuterol  2 puff Inhalation Q6H  . amLODipine  2.5 mg Oral Daily  . vitamin C  500 mg Oral Daily  . benzonatate  200 mg Oral  TID  . carvedilol  6.25 mg Oral BID WC  . heparin injection (subcutaneous)  7,500 Units Subcutaneous Q8H  . hydroxychloroquine  400 mg Oral Daily  . influenza vac split quadrivalent PF  0.5 mL Intramuscular Tomorrow-1000  . loratadine  10 mg Oral Daily  . mometasone-formoterol  2 puff Inhalation BID  . montelukast  10 mg Oral QHS  . pantoprazole  40 mg Oral Daily  . predniSONE  50 mg Oral Daily  . venlafaxine XR  150 mg Oral BID  . zinc sulfate  220 mg Oral Daily   Continuous Infusions: . lactated ringers 100 mL/hr at 09/05/20 0541  . remdesivir 100 mg in NS 100 mL 100 mg (09/05/20 0851)     LOS: 3 days    Time spent: over 30 min    Fayrene Helper, MD [facility]   To contact the attending provider between 7A-7P or the covering provider during after hours 7P-7A, please log into the web site [email or web address] and access using universal [place] password for that web site. If you do not have the password, please call the hospital operator.  09/05/2020, 3:25 PM

## 2020-09-05 NOTE — TOC Initial Note (Signed)
Transition of Care [facility]) - Initial/Assessment Note    Patient Details  Name: Brittany Riggs MRN: [id number] Date of Birth: 05-Jul-1971  Transition of Care [facility]) CM/SW Contact:    Bartholomew Crews, RN Phone Number: [phone] 09/05/2020, 3:51 PM  Clinical Narrative:                  Notified by PT of recommendations for bariatric rollator. Spoke with patient on hospital room phone. Patient agreeable to rollator. Discussed reaching out to [employer] who will deliver to the room.   Patient states that she lives alone. She states that she may be discharged tomorrow, however, she is feeling very weak. Discussed that this is normal effect with covid and that discharge home does not equal ready to resume life as usual or even return to work. Patient verbalized understanding. She asked about housekeeping services. Advised to check with local services and to inquire about covid precautions.   Patient stated that she does not have transportation home. Covid transportation has been arranged for 1pm tomorrow. Will confirm actual dc in the morning and verify with transportation.   TOC following for transition needs.   Expected Discharge Plan: Home/Self Care Barriers to Discharge: Continued Medical Work up   Patient Goals and CMS Choice Patient states their goals for this hospitalization and ongoing recovery are:: return home CMS Medicare.gov Compare Post Acute Care list provided to:: Patient Choice offered to / list presented to : Patient  Expected Discharge Plan and Services Expected Discharge Plan: Home/Self Care In-house Referral: NA Discharge Planning Services: CM Consult Post Acute Care Choice: Durable Medical Equipment Living arrangements for the past 2 months: Single Family Home                 DME Arranged: Walker rolling with seat DME Agency: [employer] Date DME Agency Contacted: 09/05/20 Time DME Agency Contacted: 14 Representative spoke with at DME Agency: [facility]  Arranged: NA          Prior Living Arrangements/Services Living arrangements for the past 2 months: [place] Lives with:: Self Patient language and need for interpreter reviewed:: Yes        Need for Family Participation in Patient Care: No (Comment)     Criminal Activity/Legal Involvement Pertinent to Current Situation/Hospitalization: No - Comment as needed  Activities of Daily Living Home Assistive Devices/Equipment: None ADL Screening (condition at time of admission) Patient's cognitive ability adequate to safely complete daily activities?: No Is the patient deaf or have difficulty hearing?: No Does the patient have difficulty seeing, even when wearing glasses/contacts?: No Does the patient have difficulty concentrating, remembering, or making decisions?: No Patient able to express need for assistance with ADLs?: No Does the patient have difficulty dressing or bathing?: No Independently performs ADLs?: Yes (appropriate for developmental age) Does the patient have difficulty walking or climbing stairs?: No Weakness of Legs: Both Weakness of Arms/Hands: None  Permission Sought/Granted                  Emotional Assessment Appearance:: Appears stated age Attitude/Demeanor/Rapport: Engaged Affect (typically observed): Accepting Orientation: : Oriented to Self, Oriented to  Time, Oriented to Place, Oriented to Situation Alcohol / Substance Use: Not Applicable Psych Involvement: No (comment)  Admission diagnosis:  Dehydration [E86.0] AKI (acute kidney injury) ([place]) [N17.9] Acute hypoxemic respiratory failure due to COVID-19 ([place]) [U07.1, J96.01] Pneumonia due to COVID-19 virus [U07.1, J12.82] Patient Active Problem List   Diagnosis Date Noted  . Pneumonia due to  COVID-19 virus 09/02/2020  . AKI (acute kidney injury) ([place]) 09/02/2020  . Asthma in adult, moderate persistent, uncomplicated 35/46/5681  . Hypoxia 09/02/2020  . Essential hypertension  09/02/2020  . Dehydration 09/02/2020  . Immunization reaction 03/06/2020  . 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 Adverse food reaction 08/02/2019  . Not well controlled moderate persistent asthma 06/29/2019  . Seasonal and perennial allergic rhinitis 06/29/2019  . Prediabetes 04/05/2019  . Thyroid nodule 10/19/2016  . Hashimoto's disease 10/19/2016  . Persistent cough 06/23/2016  . Seasonal and perennial allergic rhinitis 06/23/2016  . GERD (gastroesophageal reflux disease) 06/23/2016  . Cough variant asthma 03/13/2016  . Severe obesity (BMI >= 40) ([place]) 03/13/2016  . Upper airway cough syndrome 03/12/2016   PCP:  [name], MD Pharmacy:   [facility], [place] [address] Phone: [phone] Fax: [place], [place] [address] Phone: [phone] Fax: [place], [place] [address] AT [place] [address] Phone: [phone] Fax: [phone], LLC (New Address) - [place], [place] AT Previously: [place], [place] [address] [place] [address] Phone: [phone] Fax: [phone]  [facility] [place], [place] orders to [place] Send orders to [place] [address] Phone: [phone] Fax: [phone]  [facility] [place], [place] [address] [place] [address] Phone: [phone] Fax: [phone]     Social Determinants of Health (SDOH) Interventions    Readmission Risk Interventions No flowsheet data found.

## 2020-09-05 NOTE — Progress Notes (Signed)
Physical Therapy Treatment Patient Details Name: Brittany Riggs MRN: [id number] DOB: November 23, 1971 Today's Date: 09/05/2020    History of Present Illness Patient is a 49 y.o. female with PMHx of bronchial asthma, rheumatoid arthritis, HTN, morbid obesity-who has been not feeling well for past 3 weeks with fatigue, malaise, cough-developed worsening cough and shortness of breath.  Pt found to be COVID + and admitted with hypoxic resp failure secondary to COVID 19 PNE.  Pt received 1st Pfizer vaccine in March but developed anaphylaxis.    PT Comments    Pt progressing well towards their physical therapy goals; she remains motivated to participate and has good insight into deficits. Session focused on bed level exercises and progressive ambulation. Pt ambulating 25 ft, then 35 ft with a seated rest break; SpO2 95-96% on RA, HR peak 127 bpm. Educated pt on recommendation of bariatric Rollator in addition to shower seat (pt looked up on [place] to purchase). Also discussed energy conservation techniques, activity progression, IS use, and preparing for return to work.     Follow Up Recommendations  No PT follow up     Equipment Recommendations  Other (comment) (Rollator)    Recommendations for Other Services       Precautions / Restrictions Precautions Precautions: Fall Restrictions Weight Bearing Restrictions: No    Mobility  Bed Mobility Overal bed mobility: Modified Independent             General bed mobility comments: Increased time/effort  Transfers Overall transfer level: Needs assistance Equipment used: None Transfers: Sit to/from Stand;Stand Pivot Transfers Sit to Stand: Supervision            Ambulation/Gait Ambulation/Gait assistance: Min guard Gait Distance (Feet): 60 Feet (25", 35") Assistive device: None Gait Pattern/deviations: Step-through pattern;Decreased stride length Gait velocity: decreased   General Gait Details: Pt ambulated 25 ft, then 35  ft with one seated rest break in between. Cues for pursed lip breathing, activity pacing   Stairs             Wheelchair Mobility    Modified Rankin (Stroke Patients Only)       Balance Overall balance assessment: Needs assistance Sitting-balance support: No upper extremity supported Sitting balance-Leahy Scale: Good     Standing balance support: Single extremity supported Standing balance-Leahy Scale: Fair Standing balance comment: reaching out to IV pole with at least one handed support for stability                             Cognition Arousal/Alertness: Awake/alert Behavior During Therapy: [facility] for tasks assessed/performed;Flat affect Overall Cognitive Status: Within Functional Limits for tasks assessed                                        Exercises General Exercises - Lower Extremity Long Arc Quad: Both;10 reps;Seated Heel Slides: Both;10 reps;Supine Straight Leg Raises: Both;10 reps;Supine    General Comments        Pertinent Vitals/Pain Pain Assessment: No/denies pain    Home Living                      Prior Function            PT Goals (current goals can now be found in the care plan section) Acute Rehab PT Goals Patient Stated Goal: return home; get stronger  Potential to Achieve Goals: Good Progress towards PT goals: Progressing toward goals    Frequency    Min 3X/week      PT Plan Current plan remains appropriate    Co-evaluation              AM-PAC PT "6 Clicks" Mobility   Outcome Measure  Help needed turning from your back to your side while in a flat bed without using bedrails?: None Help needed moving from lying on your back to sitting on the side of a flat bed without using bedrails?: None Help needed moving to and from a bed to a chair (including a wheelchair)?: None Help needed standing up from a chair using your arms (e.g., wheelchair or bedside chair)?: None Help needed to  walk in hospital room?: A Little Help needed climbing 3-5 steps with a railing? : A Lot 6 Click Score: 21    End of Session   Activity Tolerance: Patient tolerated treatment well Patient left: Other (comment) (in bathroom) Nurse Communication: Mobility status PT Visit Diagnosis: Other abnormalities of gait and mobility (R26.89)     Time: 4461-9012 PT Time Calculation (min) (ACUTE ONLY): 54 min  Charges:  $Therapeutic Exercise: 8-22 mins $Therapeutic Activity: 38-52 mins                       Brittany Riggs, PT, DPT Acute Rehabilitation Services Pager [phone] Office [phone]    Brittany Riggs 09/05/2020, 5:05 PM

## 2020-09-06 LAB — COMPREHENSIVE METABOLIC PANEL
ALT: 133 U/L — ABNORMAL HIGH (ref 0–44)
AST: 60 U/L — ABNORMAL HIGH (ref 15–41)
Albumin: 2.6 g/dL — ABNORMAL LOW (ref 3.5–5.0)
Alkaline Phosphatase: 52 U/L (ref 38–126)
Anion gap: 9 (ref 5–15)
BUN: 31 mg/dL — ABNORMAL HIGH (ref 6–20)
CO2: 23 mmol/L (ref 22–32)
Calcium: 8.3 mg/dL — ABNORMAL LOW (ref 8.9–10.3)
Chloride: 109 mmol/L (ref 98–111)
Creatinine, Ser: 1.41 mg/dL — ABNORMAL HIGH (ref 0.44–1.00)
GFR calc Af Amer: 51 mL/min — ABNORMAL LOW (ref >60)
GFR calc non Af Amer: 44 mL/min — ABNORMAL LOW (ref >60)
Glucose, Bld: 113 mg/dL — ABNORMAL HIGH (ref 70–99)
Potassium: 3.8 mmol/L (ref 3.5–5.1)
Sodium: 141 mmol/L (ref 135–145)
Total Bilirubin: 0.7 mg/dL (ref 0.3–1.2)
Total Protein: 5.5 g/dL — ABNORMAL LOW (ref 6.5–8.1)

## 2020-09-06 LAB — C-REACTIVE PROTEIN: CRP: 0.8 mg/dL (ref <1.0)

## 2020-09-06 LAB — CBC WITH DIFFERENTIAL/PLATELET
Abs Immature Granulocytes: 2.24 10*3/uL — ABNORMAL HIGH (ref 0.00–0.07)
Basophils Absolute: 0.1 10*3/uL (ref 0.0–0.1)
Basophils Relative: 1 %
Eosinophils Absolute: 0 10*3/uL (ref 0.0–0.5)
Eosinophils Relative: 0 %
HCT: 30.3 % — ABNORMAL LOW (ref 36.0–46.0)
Hemoglobin: 10 g/dL — ABNORMAL LOW (ref 12.0–15.0)
Immature Granulocytes: 10 %
Lymphocytes Relative: 8 %
Lymphs Abs: 1.8 10*3/uL (ref 0.7–4.0)
MCH: 32.4 pg (ref 26.0–34.0)
MCHC: 33 g/dL (ref 30.0–36.0)
MCV: 98.1 fL (ref 80.0–100.0)
Monocytes Absolute: 2 10*3/uL — ABNORMAL HIGH (ref 0.1–1.0)
Monocytes Relative: 9 %
Neutro Abs: 17.1 10*3/uL — ABNORMAL HIGH (ref 1.7–7.7)
Neutrophils Relative %: 72 %
Platelets: 362 10*3/uL (ref 150–400)
RBC: 3.09 MIL/uL — ABNORMAL LOW (ref 3.87–5.11)
RDW: 14.6 % (ref 11.5–15.5)
WBC: 23.2 10*3/uL — ABNORMAL HIGH (ref 4.0–10.5)
nRBC: 0.2 % (ref 0.0–0.2)

## 2020-09-06 LAB — HEPATITIS PANEL, ACUTE
HCV Ab: NONREACTIVE
Hep A IgM: NONREACTIVE
Hep B C IgM: NONREACTIVE
Hepatitis B Surface Ag: NONREACTIVE

## 2020-09-06 LAB — D-DIMER, QUANTITATIVE: D-Dimer, Quant: 1.58 ug/mL-FEU — ABNORMAL HIGH (ref 0.00–0.50)

## 2020-09-06 LAB — FERRITIN: Ferritin: 829 ng/mL — ABNORMAL HIGH (ref 11–307)

## 2020-09-06 MED ORDER — TIZANIDINE HCL 2 MG PO TABS: 2.0000 mg | ORAL_TABLET | Freq: Every evening | ORAL | 0 refills | Status: AC | PRN

## 2020-09-06 MED ORDER — PREDNISONE 20 MG PO TABS: ORAL_TABLET | ORAL | 0 refills | Status: AC

## 2020-09-06 MED ORDER — BENZONATATE 200 MG PO CAPS
200.0000 mg | ORAL_CAPSULE | Freq: Three times a day (TID) | ORAL | 0 refills | Status: DC
Start: 2020-09-06 — End: 2021-03-07

## 2020-09-06 MED ORDER — MOMETASONE FURO-FORMOTEROL FUM 200-5 MCG/ACT IN AERO: 2.0000 | INHALATION_SPRAY | Freq: Two times a day (BID) | RESPIRATORY_TRACT | 1 refills | Status: DC

## 2020-09-06 MED ORDER — ALBUTEROL SULFATE HFA 108 (90 BASE) MCG/ACT IN AERS: 2.0000 | INHALATION_SPRAY | Freq: Four times a day (QID) | RESPIRATORY_TRACT | 0 refills | Status: DC | PRN

## 2020-09-06 MED FILL — BENZONATATE 200 MG CAPS: 200 | 6 days supply | Qty: 20 | Fill #0

## 2020-09-06 MED FILL — tiZANidine HCL 2 MG TABS: 2 | 30 days supply | Qty: 30 | Fill #0

## 2020-09-06 NOTE — Discharge Summary (Signed)
Physician Discharge Summary  Jumanah Hynson RDE:[id number] DOB: 12-17-1970 DOA: 09/01/2020  PCP: [name], MD  Admit date: 09/01/2020 Discharge date: 09/06/2020  Time spent: 40 minutes  Recommendations for Outpatient Follow-up:  1. Follow outpatient CBC/CMP 2. Quarantine per [employer] guidelines (until Oct 6) 3. Follow acute hepatitis panel, LFT's outpatient 4. Follow blood sugars after steroids 5. Follow BP off thiazide and arb, resume as able based on renal function and BP  6. CXR in 3-4 weeks  Discharge Diagnoses:  Principal Problem:   Pneumonia due to COVID-19 virus Active Problems:   Severe obesity (BMI >= 40) (HCC)   Not well controlled moderate persistent asthma   AKI (acute kidney injury) ([place])   Hypoxia   Essential hypertension   Dehydration   Discharge Condition: stable  Diet recommendation: heart healthy  Filed Weights   09/02/20 0040  Weight: (!) 156 kg    History of present illness:  Patient is a48 y.o.femalewith PMHx of bronchial asthma, rheumatoid arthritis, HTN, morbid obesity-who has been not feeling well for past 3 weeks with fatigue, malaise, cough-developed worsening cough and shortness of breath approximately 3 days prior to this hospital stay-she subsequently presented to the hospital on 9/19 with shortness of breath and was diagnosed with acute hypoxic respiratory failure secondary to COVID-19 pneumonia.  She's improved gradually with steroids and remdesivir.  Plan for d/c today with steroid taper.  See below for additional details.  Hospital Course:  Acute Hypoxic Resp Failure due to Covid 19 Viral pneumonia: Partially vaccinated due to anaphylaxis after 1 dose of pfizer in march 2021 - she's planning to get J&J when eligible  CXR with bibasilar atelectasis Currently on RA, appears to be improving Continue steroids, remdesivir - will complete remdesivir today, d/c with steroid taper Strict I/O, daily weights Prone as able, OOB, IS,  flutter  COVID-19 Labs  Recent Labs    09/04/20 1130 09/05/20 0907 09/06/20 0430  DDIMER 1.23* 1.31* 1.58*  FERRITIN 1,148* 964* 829*  CRP 1.8* 1.0* 0.8    Lab Results  Component Value Date   SARSCOV2NAA POSITIVE (Rosellen Lichtenberger) 08/28/2020   [place] Not Detected 01/18/2020   Elevated D-dimer: Likely secondary to COVID-19-Doppler/VQ scan negative for VTE Continue DVT ppx  Leukocytosis: 2/2 steroids, follow  AKI: Hemodynamically mediated-likely secondary to COVID-19-use of ARB/HCTZ-improving with supportive care. Avoid nephrotoxic agents. Improving, follow UA (bland) - hold thiazide/arb until follow up with PCP Follow renal US (negative for hydro) Unclear baseline - creatinine in 08/2016 was <1, follow with IVF  Hypokalemia:replace and follow - improved  Transaminitis:Secondary to COVID-19-downtrending-follow periodically.  Follow acute hepatitis panel - pending.   HTN:BP relatively stable-continue Coreg, amlodipine-ARB/diuretics on hold given AKI.  Hold these until follow up with PCP.  Bronchial asthma:No flare-continue inhaler regimen.  GERD:Continue PPI  Rheumatoid arthritis:Continue hydroxychloroquine-follow.  Depression/anxiety: Stable but could have worsened slightly due to grief (mother passed away recently) -continue Effexor  Debility/deconditioning/fatigue/malaise-ambulate with nursing staff/PT and see how she does.  Morbid obesity Body mass index is 49.35 kg/m.  Procedures: LE [place] Summary:  RIGHT:  - Findings appear essentially unchanged compared to previous examination.  - There is no evidence of deep vein thrombosis in the lower extremity.  However, portions of this examination were limited- see technologist  comments above.    LEFT:  - There is no evidence of deep vein thrombosis in the lower extremity.  However, portions of this examination were limited- see technologist  comments above.   Consultations:  none  Discharge  Exam:  Vitals:   09/06/20 0342 09/06/20 0811  BP: 139/69 131/63  Pulse: 91 88  Resp: 18 17  Temp: 98.4 F (36.9 C) 98 F (36.7 C)  SpO2: 97% 97%   Feels tired No new complaints  General: No acute distress. Cardiovascular: Heart sounds show Abhi Moccia regular rate, and rhythm. Lungs: Clear to auscultation bilaterally  Abdomen: Soft, nontender, nondistended Neurological: Alert and oriented 3. Moves all extremities 4. Cranial nerves II through XII grossly intact. Skin: Warm and dry. No rashes or lesions. Extremities: No clubbing or cyanosis. No edema.  Discharge Instructions   Discharge Instructions    Call MD for:  difficulty breathing, headache or visual disturbances   Complete by: As directed    Call MD for:  extreme fatigue   Complete by: As directed    Call MD for:  hives   Complete by: As directed    Call MD for:  persistant dizziness or light-headedness   Complete by: As directed    Call MD for:  persistant nausea and vomiting   Complete by: As directed    Call MD for:  redness, tenderness, or signs of infection (pain, swelling, redness, odor or green/yellow discharge around incision site)   Complete by: As directed    Call MD for:  severe uncontrolled pain   Complete by: As directed    Call MD for:  temperature >100.4   Complete by: As directed    Diet - low sodium heart healthy   Complete by: As directed    Discharge instructions   Complete by: As directed    You were seen for COVID 19 pneumonia.  You've improved with steroids and remdesivir.  We'll send you home with steroids to taper.  You should continue to quarantine for 21 days (your quarantine can discontinued after October 6th if you continue to improve without the need for fever reducing medications).  Please hold your chlorthalidone and your irbesartan.  Your kidney function was reduced, but is improving.  Keep taking your amlodipine and coreg at your current doses.  Return for new, recurrent, or worsening  symptoms.  Please ask your PCP to request records from this hospitalization so they know what was done and what the next steps will be.   Increase activity slowly   Complete by: As directed      Allergies as of 09/06/2020      Reactions   Amoxicillin Hives   Codeine Itching   Penicillins Hives   Has patient had Luverta Korte PCN reaction causing immediate rash, facial/tongue/throat swelling, SOB or lightheadedness with hypotension: Yes Has patient had Dontario Evetts PCN reaction causing severe rash involving mucus membranes or skin necrosis: no Has patient had Nya Monds PCN reaction that required hospitalization: no Has patient had Lizeth Bencosme PCN reaction occurring within the last 10 years: no If all of the above answers are "NO", then may proceed with Cephalosporin use.   Prednisone Other (See Comments)   Severe cramps and "spasms"   Sulfa Antibiotics Other (See Comments)   Does not remember side effects   Latex Rash, Other (See Comments)   "Sometimes bothers me"      Medication List    STOP taking these medications   chlorthalidone 25 MG tablet Commonly known as: HYGROTON   cyclobenzaprine 10 MG tablet Commonly known as: FLEXERIL   hydrochlorothiazide 25 MG tablet Commonly known as: HYDRODIURIL   irbesartan 300 MG tablet Commonly known as: AVAPRO   Qvar RediHaler 80 MCG/ACT inhaler Generic drug: beclomethasone  TAKE these medications   albuterol 108 (90 Base) MCG/ACT inhaler Commonly known as: ProAir HFA Inhale 2 puffs into the lungs every 6 (six) hours as needed for wheezing or shortness of breath.   amLODipine 2.5 MG tablet Commonly known as: NORVASC Take 2.5 mg by mouth daily.   benzonatate 200 MG capsule Commonly known as: TESSALON Take 1 capsule (200 mg total) by mouth 3 (three) times daily.   carvedilol 3.125 MG tablet Commonly known as: COREG Take 3.125 mg by mouth 2 (two) times daily with Aaminah Forrester meal.   Contrave 8-90 MG Tb12 Generic drug: Naltrexone-buPROPion HCl ER Take 2 tablets by  mouth 2 (two) times daily.   Eszopiclone 3 MG Tabs Take 3 mg by mouth at bedtime as needed (sleep). Take immediately before bedtime   Flutter Devi Use as directed   hydroxychloroquine 200 MG tablet Commonly known as: PLAQUENIL Take 200 mg by mouth 2 (two) times daily.   Jolessa 0.15-0.03 MG tablet Generic drug: levonorgestrel-ethinyl estradiol Take 1 tablet by mouth daily.   loperamide 2 MG capsule Commonly known as: IMODIUM Take 1 capsule (2 mg total) by mouth 4 (four) times daily as needed for diarrhea or loose stools.   loratadine 10 MG tablet Commonly known as: CLARITIN Take 10 mg by mouth in the morning. Reported on 06/23/2016   metroNIDAZOLE 0.75 % cream Commonly known as: METROCREAM Apply 1 application topically See admin instructions.   mometasone-formoterol 200-5 MCG/ACT Aero Commonly known as: DULERA Inhale 2 puffs into the lungs 2 (two) times daily.   montelukast 10 MG tablet Commonly known as: SINGULAIR Take 1 tablet (10 mg total) by mouth at bedtime.   naratriptan 2.5 MG tablet Commonly known as: AMERGE Take 2.5 mg by mouth as needed for migraine.   omeprazole 40 MG capsule Commonly known as: PRILOSEC Take 1 capsule (40 mg total) by mouth daily.   ondansetron 4 MG disintegrating tablet Commonly known as: Zofran ODT Take 1 tablet (4 mg total) by mouth every 8 (eight) hours as needed for nausea or vomiting.   predniSONE 20 MG tablet Commonly known as: DELTASONE Take 2 tablets (40 mg total) by mouth daily for 2 days, THEN 1.5 tablets (30 mg total) daily for 1 day, THEN 1 tablet (20 mg total) daily for 1 day, THEN 0.5 tablets (10 mg total) daily for 1 day. Start taking on: September 06, 2020   tiZANidine 2 MG tablet Commonly known as: ZANAFLEX Take 1 tablet (2 mg total) by mouth at bedtime as needed for muscle spasms.   traMADol 50 MG tablet Commonly known as: Ultram 1-2 every 4 hours as needed for cough or pain What changed:   how much to  take  how to take this  when to take this  reasons to take this   valACYclovir 500 MG tablet Commonly known as: VALTREX Take 500 mg by mouth daily.   venlafaxine XR 150 MG 24 hr capsule Commonly known as: EFFEXOR-XR Take 150 mg by mouth 2 (two) times daily with Laiyla Slagel meal. What changed: Another medication with the same name was removed. Continue taking this medication, and follow the directions you see here.   Vitamin D3 125 MCG (5000 UT) Tabs Take 5,000 Units by mouth daily.   Xiidra 5 % Soln Generic drug: Lifitegrast Place 1 drop into both eyes 2 (two) times daily.   ZyrTEC Allergy 10 MG tablet Generic drug: cetirizine Take 10 mg by mouth at bedtime as needed for allergies or rhinitis.  Durable Medical Equipment  (From admission, onward)         Start     Ordered   09/06/20 0939  DME Tub Bench  Once        09/06/20 1610   09/05/20 1538  For home use only DME 4 wheeled rolling walker with seat  Once       Comments: bariatric  Question:  Patient needs Markavious Micco walker to treat with the following condition  Answer:  Decreased functional mobility and endurance   09/05/20 1537         Allergies  Allergen Reactions  . Amoxicillin Hives  . Codeine Itching  . Penicillins Hives    Has patient had Coady Train PCN reaction causing immediate rash, facial/tongue/throat swelling, SOB or lightheadedness with hypotension: Yes Has patient had Marquis Diles PCN reaction causing severe rash involving mucus membranes or skin necrosis: no Has patient had [place] PCN reaction that required hospitalization: no Has patient had Annise Boran PCN reaction occurring within the last 10 years: no If all of the above answers are "NO", then may proceed with Cephalosporin use.   . Prednisone Other (See Comments)    Severe cramps and "spasms"  . Sulfa Antibiotics Other (See Comments)    Does not remember side effects  . Latex Rash and Other (See Comments)    "Sometimes bothers me"      The results of significant  diagnostics from this hospitalization (including imaging, microbiology, ancillary and laboratory) are listed below for reference.    Significant Diagnostic Studies: NM Pulmonary Perfusion  Result Date: 09/02/2020 CLINICAL DATA:  Shortness of breath with positive D-dimer EXAM: NUCLEAR MEDICINE PERFUSION LUNG SCAN TECHNIQUE: Perfusion images were obtained in multiple projections after intravenous injection of radiopharmaceutical Views: Anterior, posterior, left lateral, right lateral, RPO, LPO, RAO, LAO. RADIOPHARMACEUTICALS:  3.8 mCi Tc-52m MAA IV COMPARISON:  Chest radiograph September 01, 2020 FINDINGS: Radiotracer uptake is homogeneous and symmetric bilaterally. No perfusion defects evident. IMPRESSION: No perfusion defects evident. No findings indicative of pulmonary embolus. Electronically Signed   By: Lowella Grip III M.D.   On: 09/02/2020 15:31   US RENAL  Result Date: 09/05/2020 CLINICAL DATA:  Acute kidney injury/insufficiency EXAM: RENAL / URINARY TRACT ULTRASOUND COMPLETE COMPARISON:  None. FINDINGS: Right Kidney: Renal measurements: 12.3 x 5.2 x 5.9 = volume: 199.4 mL. Echogenicity within normal limits. No mass or hydronephrosis visualized. Left Kidney: Renal measurements: 11.1 x 5.5 x 5.7 = volume: 183.2 mL. Echogenicity within normal limits. No mass or hydronephrosis visualized. Bladder: Nondistended, postvoid. Other: None. IMPRESSION: 1. Negative.  No hydronephrosis. Electronically Signed   By: Lucrezia Europe M.D.   On: 09/05/2020 10:06   DG Chest Port 1 View  Result Date: 09/01/2020 CLINICAL DATA:  COVID, hypoxia, EXAM: PORTABLE CHEST 1 VIEW COMPARISON:  03/12/2016 FINDINGS: Lung volumes are small and there is bibasilar atelectasis. No pneumothorax or pleural effusion. Cardiac size within normal limits. Pulmonary vascularity normal. No acute bone abnormality. IMPRESSION: Bibasilar atelectasis. Electronically Signed   By: Fidela Salisbury MD   On: 09/01/2020 22:58   VAS [place] LOWER EXTREMITY  VENOUS (DVT)  Result Date: 09/02/2020  Lower Venous DVT Study Indications: Covid-19, elevated d-dimer, hypoxia.  Limitations: Body habitus and pain with compression. Comparison Study: Prior study from 11/22/17 is available for comparison Performing Technologist: Sharion Dove RVS  Examination Guidelines: Kathy Wares complete evaluation includes B-mode imaging, spectral Doppler, color Doppler, and power Doppler as needed of all accessible portions of each vessel. Bilateral testing is considered an integral part  of Valjean Ruppel complete examination. Limited examinations for reoccurring indications may be performed as noted. The reflux portion of the exam is performed with the patient in reverse Trendelenburg.  +---------+---------------+---------+-----------+----------+--------------+ RIGHT    CompressibilityPhasicitySpontaneityPropertiesThrombus Aging +---------+---------------+---------+-----------+----------+--------------+ CFV      Full           Yes      Yes                                 +---------+---------------+---------+-----------+----------+--------------+ SFJ      Full                                                        +---------+---------------+---------+-----------+----------+--------------+ FV Prox  Full                                                        +---------+---------------+---------+-----------+----------+--------------+ FV Mid   Full                                                        +---------+---------------+---------+-----------+----------+--------------+ FV DistalFull                                                        +---------+---------------+---------+-----------+----------+--------------+ PFV      Full                                                        +---------+---------------+---------+-----------+----------+--------------+ POP      Full           Yes      Yes                                  +---------+---------------+---------+-----------+----------+--------------+ PTV      Full                                                        +---------+---------------+---------+-----------+----------+--------------+ PERO                                                  Not visualized +---------+---------------+---------+-----------+----------+--------------+   +---------+---------------+---------+-----------+----------+--------------+ LEFT     CompressibilityPhasicitySpontaneityPropertiesThrombus Aging +---------+---------------+---------+-----------+----------+--------------+ CFV      Full  Yes      Yes                                 +---------+---------------+---------+-----------+----------+--------------+ SFJ      Full                                                        +---------+---------------+---------+-----------+----------+--------------+ FV Prox  Full                                                        +---------+---------------+---------+-----------+----------+--------------+ FV Mid   Full                                                        +---------+---------------+---------+-----------+----------+--------------+ FV DistalFull                                                        +---------+---------------+---------+-----------+----------+--------------+ PFV      Full                                                        +---------+---------------+---------+-----------+----------+--------------+ POP      Full           Yes      Yes                                 +---------+---------------+---------+-----------+----------+--------------+ PTV      Full                                                        +---------+---------------+---------+-----------+----------+--------------+ PERO                                                  Not visualized  +---------+---------------+---------+-----------+----------+--------------+     Summary: RIGHT: - Findings appear essentially unchanged compared to previous examination. - There is no evidence of deep vein thrombosis in the lower extremity. However, portions of this examination were limited- see technologist comments above.  LEFT: - There is no evidence of deep vein thrombosis in the lower extremity. However, portions of this examination were limited- see technologist comments above.  *See table(s) above for measurements and observations. Electronically signed by Ruta Hinds MD on 09/02/2020  at 5:19:04 PM.    Final     Microbiology: Recent Results (from the past 240 hour(s))  SARS CORONAVIRUS 2 (TAT 6-24 HRS) Nasopharyngeal Nasopharyngeal Swab     Status: Abnormal   Collection Time: 08/28/20  1:15 PM   Specimen: Nasopharyngeal Swab  Result Value Ref Range Status   SARS Coronavirus 2 POSITIVE (Charo Philipp) NEGATIVE Final    Comment: EMAILED [place] ON 08/29/2020 B Y MESSAN H. (NOTE) SARS-CoV-2 target nucleic acids are DETECTED.  The SARS-CoV-2 RNA is generally detectable in upper and lower respiratory specimens during the acute phase of infection. Positive results are indicative of the presence of SARS-CoV-2 RNA. Clinical correlation with patient history and other diagnostic information is  necessary to determine patient infection status. Positive results do not rule out bacterial infection or co-infection with other viruses.  The expected result is Negative.  Fact Sheet for Patients: SugarRoll.be  Fact Sheet for Healthcare Providers: https://www.woods-mathews.com/  This test is not yet approved or cleared by the [place] FDA and  has been authorized for detection and/or diagnosis of SARS-CoV-2 by FDA under an Emergency Use Authorization (EUA). This EUA will remain  in effect (meaning this test can be used) for the duration of the COVID-1  9 declaration under Section 564(b)(1) of the Act, 21 U.S.C. section 360bbb-3(b)(1), unless the authorization is terminated or revoked sooner.   Performed at [facility], [place] [address], [place], [place] [address]   Blood Culture (routine x 2)     Status: None (Preliminary result)   Collection Time: 09/02/20  2:17 AM   Specimen: BLOOD  Result Value Ref Range Status   Specimen Description BLOOD LEFT ANTECUBITAL  Final   Special Requests   Final    BOTTLES DRAWN AEROBIC AND ANAEROBIC Blood Culture adequate volume   Culture   Final    NO GROWTH 3 DAYS Performed at [facility], [place] [address] [place], [place] [address]    Report Status PENDING  Incomplete  Blood Culture (routine x 2)     Status: None (Preliminary result)   Collection Time: 09/02/20  2:19 AM   Specimen: BLOOD  Result Value Ref Range Status   Specimen Description BLOOD RIGHT ANTECUBITAL  Final   Special Requests   Final    BOTTLES DRAWN AEROBIC AND ANAEROBIC Blood Culture adequate volume   Culture   Final    NO GROWTH 3 DAYS Performed at [facility], [place] [address] [place], [place] [address]    Report Status PENDING  Incomplete     Labs: Basic Metabolic Panel: Recent Labs  Lab 09/02/20 0947 09/03/20 0118 09/04/20 1130 09/05/20 0907 09/06/20 0430  NA 136 136 139 139 141  K 3.2* 3.7 3.1* 3.5 3.8  CL 99 101 105 109 109  CO2 21* 20* 21* 21* 23  GLUCOSE 180* 127* 178* 150* 113*  BUN 36* 38* 38* 36* 31*  CREATININE 2.15* 1.79* 1.78* 1.53* 1.41*  CALCIUM 8.7* 8.9 8.6* 8.4* 8.3*   Liver Function Tests: Recent Labs  Lab 09/02/20 0947 09/03/20 0118 09/04/20 1130 09/05/20 0907 09/06/20 0430  AST 133* 96* 68* 64* 60*  ALT 206* 179* 143* 136* 133*  ALKPHOS 63 56 53 49 52  BILITOT 1.6* 1.2 0.9 0.5 0.7  PROT 7.5 7.1 6.4* 5.9* 5.5*  ALBUMIN 3.1* 3.0* 2.7* 2.6* 2.6*   No results for input(s): LIPASE, AMYLASE in the last 168 hours. No results for input(s): AMMONIA in the last  168  hours. CBC: Recent Labs  Lab 09/02/20 0947 09/03/20 0118 09/04/20 1130 09/05/20 0907 09/06/20 0430  WBC 13.6* 21.9* 24.8* 26.0* 23.2*  NEUTROABS 10.6* 17.8* 21.7* 21.5* 17.1*  HGB 12.0 10.7* 9.8* 9.8* 10.0*  HCT 36.6 32.6* 30.1* 29.7* 30.3*  MCV 98.4 95.9 98.4 97.1 98.1  PLT 348 330 350 380 362   Cardiac Enzymes: No results for input(s): CKTOTAL, CKMB, CKMBINDEX, TROPONINI in the last 168 hours. BNP: BNP (last 3 results) No results for input(s): BNP in the last 8760 hours.  ProBNP (last 3 results) No results for input(s): PROBNP in the last 8760 hours.  CBG: No results for input(s): GLUCAP in the last 168 hours.     Signed:  Fayrene Helper MD.  [facility] 09/06/2020, 9:42 AM

## 2020-09-06 NOTE — TOC Transition Note (Signed)
Transition of Care [facility]) - CM/SW Discharge Note   Patient Details  Name: Aneisa Karren MRN: [id number] Date of Birth: 1971-01-16  [facility] CM/SW Contact:  Bartholomew Crews, RN Phone Number: [phone] 09/06/2020, 12:24 PM   Clinical Narrative:     Patient to transition home today. Unable to reach patient in her room this morning or on her mobile phone. Spoke with bedside RN about Covid transportation being scheduled. Call back received from transportation to advise of 12:45 pick up. RN confirmed rollator had been delivered to the room. No further TOC needs identified.   Final next level of care: Home/Self Care Barriers to Discharge: No Barriers Identified   Patient Goals and CMS Choice Patient states their goals for this hospitalization and ongoing recovery are:: return home CMS Medicare.gov Compare Post Acute Care list provided to:: Patient Choice offered to / list presented to : Patient  Discharge Placement                       Discharge Plan and Services In-house Referral: NA Discharge Planning Services: CM Consult Post Acute Care Choice: Durable Medical Equipment          DME Arranged: Walker rolling with seat DME Agency: [employer] Date DME Agency Contacted: 09/05/20 Time DME Agency Contacted: [phone] Representative spoke with at DME Agency: Freda Munro [facility] Arranged: NA          Social Determinants of Health ([place]) Interventions     Readmission Risk Interventions No flowsheet data found.

## 2020-09-07 LAB — CULTURE, BLOOD (ROUTINE X 2)
Culture: NO GROWTH
Culture: NO GROWTH
Special Requests: ADEQUATE
Special Requests: ADEQUATE

## 2020-09-13 NOTE — Progress Notes (Signed)
Subjective:   Patient ID: Brittany Riggs, female   DOB: 49 y.o.   MRN: [id number]   HPI Saw Brittany Riggs   ROS      Objective:  Physical Exam  Not done     Assessment:  Not done     Plan:  Not done

## 2020-09-26 ENCOUNTER — Ambulatory Visit (INDEPENDENT_AMBULATORY_CARE_PROVIDER_SITE_OTHER): Payer: 59

## 2020-09-26 DIAGNOSIS — J309 Allergic rhinitis, unspecified: Secondary | ICD-10-CM

## 2020-10-04 ENCOUNTER — Other Ambulatory Visit: Payer: Self-pay | Admitting: Family Medicine

## 2020-10-04 ENCOUNTER — Ambulatory Visit
Admission: RE | Admit: 2020-10-04 | Discharge: 2020-10-04 | Disposition: A | Discharge status: Home / Self Care | Payer: 59 | Source: Ambulatory Visit | Attending: Family Medicine | Admitting: Family Medicine

## 2020-10-04 ENCOUNTER — Ambulatory Visit (INDEPENDENT_AMBULATORY_CARE_PROVIDER_SITE_OTHER): Payer: 59 | Admitting: *Deleted

## 2020-10-04 DIAGNOSIS — U071 COVID-19: Secondary | ICD-10-CM

## 2020-10-04 DIAGNOSIS — J309 Allergic rhinitis, unspecified: Secondary | ICD-10-CM | POA: Diagnosis not present

## 2020-10-10 ENCOUNTER — Ambulatory Visit (INDEPENDENT_AMBULATORY_CARE_PROVIDER_SITE_OTHER): Payer: 59

## 2020-10-10 DIAGNOSIS — J309 Allergic rhinitis, unspecified: Secondary | ICD-10-CM

## 2020-10-23 ENCOUNTER — Ambulatory Visit (INDEPENDENT_AMBULATORY_CARE_PROVIDER_SITE_OTHER): Payer: 59 | Admitting: Pulmonary Disease

## 2020-10-23 ENCOUNTER — Ambulatory Visit (INDEPENDENT_AMBULATORY_CARE_PROVIDER_SITE_OTHER): Payer: 59 | Admitting: *Deleted

## 2020-10-23 ENCOUNTER — Other Ambulatory Visit: Payer: Self-pay

## 2020-10-23 VITALS — BP 142/68 | HR 118 | Temp 97.3°F | Ht 69.0 in | Wt 351.6 lb

## 2020-10-23 DIAGNOSIS — R059 Cough, unspecified: Secondary | ICD-10-CM | POA: Diagnosis not present

## 2020-10-23 DIAGNOSIS — G4733 Obstructive sleep apnea (adult) (pediatric): Secondary | ICD-10-CM | POA: Diagnosis not present

## 2020-10-23 DIAGNOSIS — J1282 Pneumonia due to coronavirus disease 2019: Secondary | ICD-10-CM

## 2020-10-23 DIAGNOSIS — U071 COVID-19: Secondary | ICD-10-CM

## 2020-10-23 DIAGNOSIS — J309 Allergic rhinitis, unspecified: Secondary | ICD-10-CM

## 2020-10-23 NOTE — Patient Instructions (Signed)
I am sorry not feeling well with the cough Continue the tramadol, inhalers Increase your antiacid medication twice daily as already done by your primary care Continue Zyrtec and Singulair.  I will get a high-res CT for better evaluation of your lungs to see if there is any residual inflammation from COVID-19.  Based on those results we may decide to give additional steroids.  We will also get a home sleep study for evaluation of sleep apnea.  Follow-up in 1 to 2 months.

## 2020-10-23 NOTE — Progress Notes (Signed)
Brittany Riggs    [id number]    02-May-1971  Primary Care Physician:[name], [name], MD  Referring Physician: [name], MD [place],  [place] [address]  Chief complaint: Consult for chronic cough, post COVID-19, asthma  HPI:  49 year old with history of morbid obesity, rheumatoid arthritis, GERD, asthma.  Previously seen by Dr. Melvyn Novas in 2017 for chronic cough with CT scan showing mild nonspecific changes and no acute interstitial lung disease. She was hospitalized for COVID-19 in September 2021 and received steroids, remdesivir.  After her COVID-19 infection she has had persistent chronic cough that has been refractory to promethazine, Tessalon Perls.  She is allergic to codeine and oral prednisone but is okay with steroid shots and methylprednisolone.  Her primary care has increased her PPI to twice daily recently.  She had a chest x-ray last month which showed increasing opacities on the right which was treated with 2 rounds of antibiotics including Zithromax Has history of longstanding rheumatoid arthritis and is on Rinvoq and Plaquenil. Has allergies and rhinitis for which she takes OTC antihistamines.  Cannot tolerate nasal sprays as it causes epistaxis.  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  No chemical exposures.  No feather pillows or comforters Smoking history: Never smoker Travel history: No significant travel history Relevant family history: Sister was recently diagnosed with lupus   Outpatient Encounter Medications as of 10/23/2020  Medication Sig  . albuterol (PROAIR HFA) 108 (90 Base) MCG/ACT inhaler Inhale 2 puffs into the lungs every 6 (six) hours as needed for wheezing or shortness of breath.  Marland Kitchen amLODipine (NORVASC) 2.5 MG tablet Take 2.5 mg by mouth daily.   .  carvedilol (COREG) 3.125 MG tablet Take 3.125 mg by mouth 2 (two) times daily with a meal.   . cetirizine (ZYRTEC ALLERGY) 10 MG tablet Take 10 mg by mouth at bedtime as needed for allergies or rhinitis.  . Cholecalciferol (VITAMIN D3) 5000 units TABS Take 5,000 Units by mouth daily.   Marland Kitchen CONTRAVE 8-90 MG TB12 Take 2 tablets by mouth 2 (two) times daily.  . Eszopiclone 3 MG TABS Take 3 mg by mouth at bedtime as needed (sleep). Take immediately before bedtime   . hydroxychloroquine (PLAQUENIL) 200 MG tablet Take 200 mg by mouth daily. 2 tabs daily  . JOLESSA 0.15-0.03 MG tablet Take 1 tablet by mouth daily.  Marland Kitchen Lifitegrast (XIIDRA) 5 % SOLN Place 1 drop into both eyes 2 (two) times daily.  Marland Kitchen loperamide (IMODIUM) 2 MG capsule Take 1 capsule (2 mg total) by mouth 4 (four) times daily as needed for diarrhea or loose stools.  Marland Kitchen loratadine (CLARITIN) 10 MG tablet Take 10 mg by mouth in the morning. Reported on 06/23/2016  . metroNIDAZOLE (METROCREAM) 0.75 % cream Apply 1 application topically See admin instructions.   . mometasone-formoterol (DULERA) 200-5 MCG/ACT AERO Inhale 2 puffs into the lungs 2 (two) times daily.  . montelukast (SINGULAIR) 10 MG tablet Take 1 tablet (10 mg total) by mouth at bedtime.  . naratriptan (AMERGE) 2.5 MG tablet Take 2.5 mg by mouth as needed for migraine.   Marland Kitchen omeprazole (PRILOSEC) 40 MG capsule Take 1 capsule (40 mg total) by mouth daily.  . ondansetron (ZOFRAN ODT) 4 MG disintegrating tablet Take 1 tablet (4 mg total) by mouth every 8 (  eight) hours as needed for nausea or vomiting.  Marland Kitchen Respiratory Therapy Supplies (FLUTTER) DEVI Use as directed  . tiZANidine (ZANAFLEX) 2 MG tablet Take 1 tablet (2 mg total) by mouth at bedtime as needed for muscle spasms.  . traMADol (ULTRAM) 50 MG tablet 1-2 every 4 hours as needed for cough or pain (Patient taking differently: Take 50-100 mg by mouth every 4 (four) hours as needed (pain). 1-2 every 4 hours as needed for cough or pain)  .  valACYclovir (VALTREX) 500 MG tablet Take 500 mg by mouth daily.  Marland Kitchen venlafaxine XR (EFFEXOR-XR) 150 MG 24 hr capsule Take 150 mg by mouth 2 (two) times daily with a meal.   . benzonatate (TESSALON) 200 MG capsule Take 1 capsule (200 mg total) by mouth 3 (three) times daily. (Patient not taking: Reported on 10/23/2020)   No facility-administered encounter medications on file as of 10/23/2020.    Allergies as of 10/23/2020 - Review Complete 10/23/2020  Allergen Reaction Noted  . Amoxicillin Hives 03/22/2015  . Codeine Itching 03/22/2015  . Penicillins Hives 03/22/2015  . Prednisone Other (See Comments) 03/26/2016  . Sulfa antibiotics Other (See Comments) 10/16/2016  . Latex Rash and Other (See Comments) 09/02/2020    Past Medical History:  Diagnosis Date  . Allergy   . Anemia   . Asthma   . GERD (gastroesophageal reflux disease)   . Hypertension   . Hypothyroidism   . IBS (irritable bowel syndrome)   . Migraine   . Muscle cramp   . Obesity   . Plantar fasciitis   . Rheumatoid arthritis ([place])   . Vitamin D deficiency     Past Surgical History:  Procedure Laterality Date  . COLONOSCOPY WITH PROPOFOL N/A 09/28/2018   Procedure: COLONOSCOPY WITH PROPOFOL;  Surgeon: Arta Silence, MD;  Location: [facility];  Service: Endoscopy;  Laterality: N/A;  . POLYPECTOMY  09/28/2018   Procedure: POLYPECTOMY;  Surgeon: Arta Silence, MD;  Location: [facility];  Service: Endoscopy;;    Family History  Problem Relation Age of Onset  . Asthma Sister   . Hypertension Sister   . Hypercholesterolemia Sister   . Sleep apnea Sister   . Arthritis Sister   . Diabetes Sister   . Allergies Sister   . Rheum arthritis Mother   . Hypertension Mother   . Diabetes Mother   . Rheum arthritis Sister   . Kidney disease Father   . Colon cancer Father   . Breast cancer Paternal Grandmother   . Colon cancer Paternal Grandfather   . Allergic rhinitis Neg Hx   . Angioedema Neg Hx   . Atopy  Neg Hx   . Eczema Neg Hx   . Immunodeficiency Neg Hx   . Urticaria Neg Hx     Social History   Socioeconomic History  . Marital status: Single    Spouse name: Not on file  . Number of children: Not on file  . Years of education: Not on file  . Highest education level: Not on file  Occupational History  . Not on file  Tobacco Use  . Smoking status: Never Smoker  . Smokeless tobacco: Never Used  Vaping Use  . Vaping Use: Never used  Substance and Sexual Activity  . Alcohol use: No    Alcohol/week: 0.0 standard drinks  . Drug use: No  . Sexual activity: Not on file  Other Topics Concern  . Not on file  Social History Narrative  . Not on file  Social Determinants of Health   Financial Resource Strain:   . Difficulty of Paying Living Expenses: Not on file  Food Insecurity:   . Worried About Charity fundraiser in the Last Year: Not on file  . Ran Out of Food in the Last Year: Not on file  Transportation Needs:   . Lack of Transportation (Medical): Not on file  . Lack of Transportation (Non-Medical): Not on file  Physical Activity:   . Days of Exercise per Week: Not on file  . Minutes of Exercise per Session: Not on file  Stress:   . Feeling of Stress : Not on file  Social Connections:   . Frequency of Communication with Friends and Family: Not on file  . Frequency of Social Gatherings with Friends and Family: Not on file  . Attends Religious Services: Not on file  . Active Member of Clubs or Organizations: Not on file  . Attends Archivist Meetings: Not on file  . Marital Status: Not on file  Intimate Partner Violence:   . Fear of Current or Ex-Partner: Not on file  . Emotionally Abused: Not on file  . Physically Abused: Not on file  . Sexually Abused: Not on file    Review of systems: Review of Systems  Constitutional: Negative for fever and chills.  HENT: Negative.   Eyes: Negative for blurred vision.  Respiratory: as per HPI  Cardiovascular:  Negative for chest pain and palpitations.  Gastrointestinal: Negative for vomiting, diarrhea, blood per rectum. Genitourinary: Negative for dysuria, urgency, frequency and hematuria.  Musculoskeletal: Negative for myalgias, back pain and joint pain.  Skin: Negative for itching and rash.  Neurological: Negative for dizziness, tremors, focal weakness, seizures and loss of consciousness.  Endo/Heme/Allergies: Negative for environmental allergies.  Psychiatric/Behavioral: Negative for depression, suicidal ideas and hallucinations.  All other systems reviewed and are negative.  Physical Exam: There were no vitals taken for this visit. Gen:      No acute distress HEENT:  EOMI, sclera anicteric Neck:     No masses; no thyromegaly Lungs:    Clear to auscultation bilaterally; normal respiratory effort CV:         Regular rate and rhythm; no murmurs Abd:      + bowel sounds; soft, non-tender; no palpable masses, no distension Ext:    No edema; adequate peripheral perfusion Skin:      Warm and dry; no rash Neuro: alert and oriented x 3 Psych: normal mood and affect  Data Reviewed: Imaging: High-res CT 04/17/2016-minimal nonspecific changes.  No clear evidence of interstitial lung disease Chest x-ray 09/01/2020-bibasal atelectasis Chest x-ray 10/04/2020-increasing right lung opacity. I have reviewed the images personally.  PFTs:  Labs: CBC 09/06/2020-WBC 23.2, eos 0%  Assessment:  Chronic cough Differential diagnosis is broad including asthma, GERD, postnasal drip, post Covid pneumonitis She is already getting optimal treatment for asthma.  PPI increased to twice daily recently and getting antihistamines.  Cannot tolerate nasal spray We will avoid prednisone as it causes side effects such as tachycardia and mood changes.  Schedule high-res CT for better evaluation of the lungs.  If there is evidence of post Covid pneumonitis then we may need to consider alternate steroid therapy to  prednisone.  If normal then consider Neurontin for neurogenic cough  Suspected OSA Untreated OSA may contribute to ongoing cough Schedule home sleep study.  Plan/Recommendations: High-res CT Home sleep study  Marshell Garfinkel MD [facility] 10/23/2020, 9:08 AM  CC:  [name], MD

## 2020-10-29 ENCOUNTER — Ambulatory Visit (INDEPENDENT_AMBULATORY_CARE_PROVIDER_SITE_OTHER): Payer: 59

## 2020-10-29 DIAGNOSIS — J309 Allergic rhinitis, unspecified: Secondary | ICD-10-CM | POA: Diagnosis not present

## 2020-11-01 ENCOUNTER — Other Ambulatory Visit: Payer: Self-pay

## 2020-11-01 ENCOUNTER — Ambulatory Visit (INDEPENDENT_AMBULATORY_CARE_PROVIDER_SITE_OTHER)
Admission: RE | Admit: 2020-11-01 | Discharge: 2020-11-01 | Disposition: A | Discharge status: Home / Self Care | Payer: 59 | Source: Ambulatory Visit | Attending: Pulmonary Disease | Admitting: Pulmonary Disease

## 2020-11-01 DIAGNOSIS — U071 COVID-19: Secondary | ICD-10-CM | POA: Diagnosis not present

## 2020-11-01 DIAGNOSIS — J1282 Pneumonia due to coronavirus disease 2019: Secondary | ICD-10-CM

## 2020-11-12 ENCOUNTER — Institutional Professional Consult (permissible substitution): Payer: 59 | Admitting: Pulmonary Disease

## 2020-11-13 DIAGNOSIS — U071 COVID-19: Secondary | ICD-10-CM

## 2020-11-13 DIAGNOSIS — J1282 Pneumonia due to coronavirus disease 2019: Secondary | ICD-10-CM

## 2020-11-13 DIAGNOSIS — R059 Cough, unspecified: Secondary | ICD-10-CM

## 2020-11-13 DIAGNOSIS — R053 Chronic cough: Secondary | ICD-10-CM

## 2020-11-15 ENCOUNTER — Ambulatory Visit (INDEPENDENT_AMBULATORY_CARE_PROVIDER_SITE_OTHER): Payer: 59 | Admitting: *Deleted

## 2020-11-15 DIAGNOSIS — J309 Allergic rhinitis, unspecified: Secondary | ICD-10-CM

## 2020-11-21 ENCOUNTER — Ambulatory Visit (INDEPENDENT_AMBULATORY_CARE_PROVIDER_SITE_OTHER): Payer: 59

## 2020-11-21 DIAGNOSIS — J309 Allergic rhinitis, unspecified: Secondary | ICD-10-CM

## 2020-11-27 ENCOUNTER — Ambulatory Visit (INDEPENDENT_AMBULATORY_CARE_PROVIDER_SITE_OTHER): Payer: 59

## 2020-11-27 DIAGNOSIS — J309 Allergic rhinitis, unspecified: Secondary | ICD-10-CM

## 2020-12-03 ENCOUNTER — Ambulatory Visit (INDEPENDENT_AMBULATORY_CARE_PROVIDER_SITE_OTHER): Payer: 59 | Admitting: *Deleted

## 2020-12-03 DIAGNOSIS — J309 Allergic rhinitis, unspecified: Secondary | ICD-10-CM | POA: Diagnosis not present

## 2020-12-16 ENCOUNTER — Encounter: Payer: Self-pay | Admitting: Pulmonary Disease

## 2020-12-18 ENCOUNTER — Ambulatory Visit (INDEPENDENT_AMBULATORY_CARE_PROVIDER_SITE_OTHER): Payer: 59

## 2020-12-18 DIAGNOSIS — J309 Allergic rhinitis, unspecified: Secondary | ICD-10-CM | POA: Diagnosis not present

## 2020-12-20 ENCOUNTER — Ambulatory Visit: Payer: 59

## 2021-01-08 ENCOUNTER — Ambulatory Visit: Payer: Self-pay

## 2021-01-10 ENCOUNTER — Ambulatory Visit (INDEPENDENT_AMBULATORY_CARE_PROVIDER_SITE_OTHER): Payer: 59

## 2021-01-10 DIAGNOSIS — J309 Allergic rhinitis, unspecified: Secondary | ICD-10-CM | POA: Diagnosis not present

## 2021-01-22 NOTE — Progress Notes (Signed)
VIALS EXP 01-23-22

## 2021-01-23 DIAGNOSIS — J301 Allergic rhinitis due to pollen: Secondary | ICD-10-CM | POA: Diagnosis not present

## 2021-01-31 ENCOUNTER — Ambulatory Visit (INDEPENDENT_AMBULATORY_CARE_PROVIDER_SITE_OTHER): Payer: 59 | Admitting: *Deleted

## 2021-01-31 DIAGNOSIS — J309 Allergic rhinitis, unspecified: Secondary | ICD-10-CM | POA: Diagnosis not present

## 2021-02-13 ENCOUNTER — Telehealth: Payer: Self-pay | Admitting: Pulmonary Disease

## 2021-02-17 NOTE — Telephone Encounter (Signed)
LM on both numbers for pt to call me directly to schedule the HST.

## 2021-02-17 NOTE — Telephone Encounter (Signed)
Patient was called in December & January to schedule the HST.  Letter mailed to pt in January since there was no response to LM.  I will reach out to the pt again to see if we can get this scheduled.

## 2021-02-17 NOTE — Telephone Encounter (Signed)
Pt sched for 3/11 @ 2:30.

## 2021-02-20 ENCOUNTER — Ambulatory Visit (INDEPENDENT_AMBULATORY_CARE_PROVIDER_SITE_OTHER): Payer: 59

## 2021-02-20 DIAGNOSIS — J309 Allergic rhinitis, unspecified: Secondary | ICD-10-CM | POA: Diagnosis not present

## 2021-02-21 ENCOUNTER — Other Ambulatory Visit: Payer: Self-pay

## 2021-02-21 ENCOUNTER — Ambulatory Visit: Payer: 59

## 2021-02-21 DIAGNOSIS — R0683 Snoring: Secondary | ICD-10-CM | POA: Diagnosis not present

## 2021-02-21 DIAGNOSIS — G4733 Obstructive sleep apnea (adult) (pediatric): Secondary | ICD-10-CM

## 2021-02-26 DIAGNOSIS — R0683 Snoring: Secondary | ICD-10-CM | POA: Diagnosis not present

## 2021-02-28 ENCOUNTER — Ambulatory Visit (INDEPENDENT_AMBULATORY_CARE_PROVIDER_SITE_OTHER): Payer: 59 | Admitting: *Deleted

## 2021-02-28 DIAGNOSIS — J309 Allergic rhinitis, unspecified: Secondary | ICD-10-CM

## 2021-03-06 NOTE — Progress Notes (Deleted)
Follow Up Note  RE: Brittany Riggs MRN: [id number] DOB: 04/22/1971 Date of Office Visit: 03/07/2021  Referring provider: [name], MD Primary care provider: [name], MD  Chief Complaint: No chief complaint on file.  History of Present Illness: I had the pleasure of seeing Brittany Riggs for a follow up visit at the [facility] on 03/06/2021. She is a 50 y.o. female, who is being followed for asthma, allergic rhinitis on AIT, adverse food reaction and adverse vaccine reaction. Her previous allergy office visit was on 03/21/2020 with Dr. Ernst Bowler. Today is a regular follow up visit.  1. Vaccine reaction - You did not tolerate the Miralax challenge, failing even at the first small dose. - Therefore, we are going to list this as an allergy in your chart. - Miralax contains PEG, which is thought to be the allergenic component of the [place] and Moderna vaccinations.  - I would recommend getting the J&J vaccine since this has a stabilizer that is used in the influenza vaccinations. - You will have to call around to see who has the J&J vaccines.  - You received a dose of cetirizine today in clinic. - Take cetirizine 10mg  twice daily for a few days while you recover.   2. Anaphylactic shock due to food - ? shrimp - We will call you when we get those labs results back. - Hopefully you are not allergic to the shrimp.   Immunization reaction Developed throat pruritus and felt throat swelling with difficulty swallowing after receiving Pfizer COVID-19 vaccine on 02/19/20. Treated with benadryl and felt better the following day. She works with polysorbates and has noted some itching when coming in contact with the material. Some large localized swelling after flu vaccines. Tolerates miralax.   Discussed with patient that based on clinical history I'm concerned that she had an IgE mediated reaction to the [employer] vaccine.  Recommend Covid-19 vaccine  component testing and if negative then will discuss a premedication regimen before the second vaccine.  Not well controlled moderate persistent asthma  Symptoms flared with pollen and started on Qvar last week.  Daily controller medication(s):Dulera 200 2 puffs twice a day with spacer and rinse mouth afterwards.  ? Continue Singulair 10mg  daily at night.   Prior to physical activity:May use albuterol rescue inhaler 2 puffs 5 to 15 minutes prior to strenuous physical activities.  Rescue medications:May use albuterol rescue inhaler 2 puffs or nebulizer every 4 to 6 hours as needed for shortness of breath, chest tightness, coughing, and wheezing. Monitor frequency of use.   During upper respiratory infections: Start Qvar 80 2 puffs twice a day for 1-2 weeks. Rinse mouth afterwards.  Get spirometry at next visit. Consider adding Spiriva next.   Seasonal and perennial allergic rhinitis Past history - on AIT: G/Weeds/T + M/DM/C/CR since 07/02/2016. Nasal sprays cause epistaxis. Patient has dry eyes.  Interim history - some increased symptoms with the pollen.   Continue allergy injections every 2 weeks.   Continue environmental control measures.  May use over the counter antihistamines such as Zyrtec (cetirizine), Claritin (loratadine), Allegra (fexofenadine), or Xyzal (levocetirizine) daily as needed.  Continue Singulair 10mg  daily at night.  Adverse food reaction Past history - Concerned for allergies to marinara sauce and possibly noodles. Tolerates ketchup and tomatoes. Usually has itching, nausea and diarrhea after eating marinara sauce and noodles. Sesame causes itching. Interim history - No reactions since last visit.   Unable to skin test today due  to recent antihistamine intake.  Avoid marinara sauce, sesame for now.   Monitor symptoms and if you have another episode write down what you had eaten within the last 4-6 hours.  For mild symptoms you can take over the  counter antihistamines such as Benadryl and monitor symptoms closely. If symptoms worsen or if you have severe symptoms including breathing issues, throat closure, significant swelling, whole body hives, severe diarrhea and vomiting, lightheadedness then seek immediate medical care.  Will skin test to foods - top basic foods and tomatoes, grains, sesame at next visit.    Assessment and Plan: Amandamarie is a 50 y.o. female with: No problem-specific Assessment & Plan notes found for this encounter.  No follow-ups on file.  No orders of the defined types were placed in this encounter.  Lab Orders  No laboratory test(s) ordered today    Diagnostics: Spirometry:  Tracings reviewed. Her effort: {Blank single:19197::"Good reproducible efforts.","It was hard to get consistent efforts and there is a question as to whether this reflects a maximal maneuver.","Poor effort, data can not be interpreted."} FVC: ***L FEV1: ***L, ***% predicted FEV1/FVC ratio: ***% Interpretation: {Blank single:19197::"Spirometry consistent with mild obstructive disease","Spirometry consistent with moderate obstructive disease","Spirometry consistent with severe obstructive disease","Spirometry consistent with possible restrictive disease","Spirometry consistent with mixed obstructive and restrictive disease","Spirometry uninterpretable due to technique","Spirometry consistent with normal pattern","No overt abnormalities noted given today's efforts"}.  Please see scanned spirometry results for details.  Skin Testing: {Blank single:19197::"Select foods","Environmental allergy panel","Environmental allergy panel and select foods","Food allergy panel","None","Deferred due to recent antihistamines use"}. Positive test to: ***. Negative test to: ***.  Results discussed with patient/family.   Medication List:  Current Outpatient Medications  Medication Sig Dispense Refill  . albuterol (PROAIR HFA) 108 (90 Base) MCG/ACT  inhaler Inhale 2 puffs into the lungs every 6 (six) hours as needed for wheezing or shortness of breath. 18 g 0  . amLODipine (NORVASC) 2.5 MG tablet Take 2.5 mg by mouth daily.     . benzonatate (TESSALON) 200 MG capsule Take 1 capsule (200 mg total) by mouth 3 (three) times daily. (Patient not taking: Reported on 10/23/2020) 20 capsule 0  . carvedilol (COREG) 3.125 MG tablet Take 3.125 mg by mouth 2 (two) times daily with a meal.     . cetirizine (ZYRTEC ALLERGY) 10 MG tablet Take 10 mg by mouth at bedtime as needed for allergies or rhinitis.    . Cholecalciferol (VITAMIN D3) 5000 units TABS Take 5,000 Units by mouth daily.     Marland Kitchen CONTRAVE 8-90 MG TB12 Take 2 tablets by mouth 2 (two) times daily.    . Eszopiclone 3 MG TABS Take 3 mg by mouth at bedtime as needed (sleep). Take immediately before bedtime     . hydroxychloroquine (PLAQUENIL) 200 MG tablet Take 200 mg by mouth daily. 2 tabs daily    . JOLESSA 0.15-0.03 MG tablet Take 1 tablet by mouth daily.    Marland Kitchen Lifitegrast (XIIDRA) 5 % SOLN Place 1 drop into both eyes 2 (two) times daily.    Marland Kitchen loperamide (IMODIUM) 2 MG capsule Take 1 capsule (2 mg total) by mouth 4 (four) times daily as needed for diarrhea or loose stools. 20 capsule 0  . loratadine (CLARITIN) 10 MG tablet Take 10 mg by mouth in the morning. Reported on 06/23/2016    . metroNIDAZOLE (METROCREAM) 0.75 % cream Apply 1 application topically See admin instructions.     . mometasone-formoterol (DULERA) 200-5 MCG/ACT AERO Inhale 2 puffs into the lungs  2 (two) times daily. 13 g 1  . montelukast (SINGULAIR) 10 MG tablet Take 1 tablet (10 mg total) by mouth at bedtime. 90 tablet 1  . naratriptan (AMERGE) 2.5 MG tablet Take 2.5 mg by mouth as needed for migraine.   5  . omeprazole (PRILOSEC) 40 MG capsule Take 1 capsule (40 mg total) by mouth daily. 90 capsule 2  . ondansetron (ZOFRAN ODT) 4 MG disintegrating tablet Take 1 tablet (4 mg total) by mouth every 8 (eight) hours as needed for nausea  or vomiting. 21 tablet 0  . Respiratory Therapy Supplies (FLUTTER) DEVI Use as directed 1 each 0  . tiZANidine (ZANAFLEX) 2 MG tablet Take 1 tablet (2 mg total) by mouth at bedtime as needed for muscle spasms. 30 tablet 0  . traMADol (ULTRAM) 50 MG tablet 1-2 every 4 hours as needed for cough or pain (Patient taking differently: Take 50-100 mg by mouth every 4 (four) hours as needed (pain). 1-2 every 4 hours as needed for cough or pain) 40 tablet 0  . valACYclovir (VALTREX) 500 MG tablet Take 500 mg by mouth daily.    Marland Kitchen venlafaxine XR (EFFEXOR-XR) 150 MG 24 hr capsule Take 150 mg by mouth 2 (two) times daily with a meal.      No current facility-administered medications for this visit.   Allergies: Allergies  Allergen Reactions  . Amoxicillin Hives  . Codeine Itching  . Penicillins Hives    Has patient had a PCN reaction causing immediate rash, facial/tongue/throat swelling, SOB or lightheadedness with hypotension: Yes Has patient had a PCN reaction causing severe rash involving mucus membranes or skin necrosis: no Has patient had a PCN reaction that required hospitalization: no Has patient had a PCN reaction occurring within the last 10 years: no If all of the above answers are "NO", then may proceed with Cephalosporin use.   . Prednisone Other (See Comments)    Severe cramps and "spasms"  . Sulfa Antibiotics Other (See Comments)    Does not remember side effects  . Latex Rash and Other (See Comments)    "Sometimes bothers me"   I reviewed her past medical history, social history, family history, and environmental history and no significant changes have been reported from her previous visit.  Review of Systems  Constitutional: Negative for appetite change, chills, fever and unexpected weight change.  HENT: Negative for congestion, rhinorrhea and sneezing.   Eyes: Negative for itching.  Respiratory: Negative for cough, chest tightness, shortness of breath and wheezing.    Gastrointestinal: Negative for abdominal pain.  Skin: Negative for rash.  Allergic/Immunologic: Positive for environmental allergies.  Neurological: Negative for headaches.   Objective: There were no vitals taken for this visit. There is no height or weight on file to calculate BMI. Physical Exam Previous notes and tests were reviewed. The plan was reviewed with the patient/family, and all questions/concerned were addressed.  It was my pleasure to see Brittany Riggs today and participate in her care. Please feel free to contact me with any questions or concerns.  Sincerely,  Rexene Alberts, DO Allergy & Immunology  [facility] office: [place] office: [phone]

## 2021-03-07 ENCOUNTER — Other Ambulatory Visit: Payer: Self-pay

## 2021-03-07 ENCOUNTER — Ambulatory Visit: Payer: Self-pay | Admitting: *Deleted

## 2021-03-07 ENCOUNTER — Ambulatory Visit: Payer: 59 | Admitting: Allergy

## 2021-03-07 ENCOUNTER — Encounter: Payer: Self-pay | Admitting: Allergy

## 2021-03-07 ENCOUNTER — Ambulatory Visit (INDEPENDENT_AMBULATORY_CARE_PROVIDER_SITE_OTHER): Payer: 59 | Admitting: Allergy

## 2021-03-07 VITALS — BP 124/82 | HR 97 | Temp 98.1°F | Resp 16 | Ht 69.0 in | Wt 352.4 lb

## 2021-03-07 DIAGNOSIS — J4540 Moderate persistent asthma, uncomplicated: Secondary | ICD-10-CM

## 2021-03-07 DIAGNOSIS — T50905A Adverse effect of unspecified drugs, medicaments and biological substances, initial encounter: Secondary | ICD-10-CM | POA: Insufficient documentation

## 2021-03-07 DIAGNOSIS — J302 Other seasonal allergic rhinitis: Secondary | ICD-10-CM

## 2021-03-07 DIAGNOSIS — T7800XD Anaphylactic reaction due to unspecified food, subsequent encounter: Secondary | ICD-10-CM | POA: Diagnosis not present

## 2021-03-07 DIAGNOSIS — J3089 Other allergic rhinitis: Secondary | ICD-10-CM | POA: Diagnosis not present

## 2021-03-07 DIAGNOSIS — T50905D Adverse effect of unspecified drugs, medicaments and biological substances, subsequent encounter: Secondary | ICD-10-CM

## 2021-03-07 DIAGNOSIS — J309 Allergic rhinitis, unspecified: Secondary | ICD-10-CM

## 2021-03-07 DIAGNOSIS — T7800XA Anaphylactic reaction due to unspecified food, initial encounter: Secondary | ICD-10-CM | POA: Insufficient documentation

## 2021-03-07 MED ORDER — EPINEPHRINE 0.3 MG/0.3ML IJ SOAJ: 0.3000 mg | INTRAMUSCULAR | 1 refills | Status: DC | PRN

## 2021-03-07 NOTE — Assessment & Plan Note (Signed)
Past history - Developed throat pruritus and felt throat swelling with difficulty swallowing after receiving Pfizer COVID-19 vaccine on 02/19/20. Treated with benadryl and felt better the following day. She works with polysorbates and has noted some itching when coming in contact with the material. Some large localized swelling after flu vaccines.  Interim history - Patient had 2 Moderna vaccines since last OV with no reactions.

## 2021-03-07 NOTE — Assessment & Plan Note (Signed)
Had Covid-19 in September 2021 requiring hospitalization and treated with prednisone and remdesivir. Breathing back to baseline.   Today's spirometry shows some restriction most likely due to body habitus.  Daily controller medication(s):Dulera 266mcg 2 puffs twice a day with spacer and rinse mouth afterwards.  ? Continue Singulair 10mg  daily at night.  ? May take 1/2 tablet at night and see if it makes you less tired.  Prior to physical activity:May use albuterol rescue inhaler 2 puffs 5 to 15 minutes prior to strenuous physical activities.  Rescue medications:May use albuterol rescue inhaler 2 puffs or nebulizer every 4 to 6 hours as needed for shortness of breath, chest tightness, coughing, and wheezing. Monitor frequency of use.   During upper respiratory infections: Start Qvar 63mcg 2 puffs twice a day for 1-2 weeks. Rinse mouth afterwards.

## 2021-03-07 NOTE — Patient Instructions (Addendum)
Asthma:   Daily controller medication(s):Dulera 232mcg 2 puffs twice a day with spacer and rinse mouth afterwards.  ? Continue Singulair 10mg  daily at night.  ? May take 1/2 tablet at night and see if it makes you less tired.  Prior to physical activity:May use albuterol rescue inhaler 2 puffs 5 to 15 minutes prior to strenuous physical activities.  Rescue medications:May use albuterol rescue inhaler 2 puffs or nebulizer every 4 to 6 hours as needed for shortness of breath, chest tightness, coughing, and wheezing. Monitor frequency of use.   During upper respiratory infections: Start Qvar 35mcg 2 puffs twice a day for 1-2 weeks. Rinse mouth afterwards. Asthma control goals:  Full participation in all desired activities (may need albuterol before activity) Albuterol use two times or less a week on average (not counting use with activity) Cough interfering with sleep two times or less a month Oral steroids no more than once a year No hospitalizations  Seasonal and perennial allergic rhinitis  Continue allergy injections - given today.   Continue environmental control measures.  May use over the counter antihistamines such as Zyrtec (cetirizine), Claritin (loratadine), Allegra (fexofenadine), or Xyzal (levocetirizine) daily as needed. May take twice a day during flares.   Continue Singulair 10mg  daily at night.  May use nasal saline spray (i.e., Simply Saline) as needed.  Adverse food reaction  Continue to avoid shellfish and sesame for now.   For mild symptoms you can take over the counter antihistamines such as Benadryl and monitor symptoms closely. If symptoms worsen or if you have severe symptoms including breathing issues, throat closure, significant swelling, whole body hives, severe diarrhea and vomiting, lightheadedness then inject epinephrine and seek immediate medical care afterwards.  Follow up in 6 months or sooner if needed.

## 2021-03-07 NOTE — Assessment & Plan Note (Signed)
Past history -  Sesame causes itching. 2021 bloodwork borderline positive to shellfish. Interim history - No reactions since last visit.   Continue to avoid shellfish and sesame for now.   For mild symptoms you can take over the counter antihistamines such as Benadryl and monitor symptoms closely. If symptoms worsen or if you have severe symptoms including breathing issues, throat closure, significant swelling, whole body hives, severe diarrhea and vomiting, lightheadedness then inject epinephrine and seek immediate medical care afterwards.

## 2021-03-07 NOTE — Progress Notes (Signed)
Follow Up Note  RE: Brittany Riggs MRN: [id number] DOB: 08-26-1971 Date of Office Visit: 03/07/2021  Referring provider: [name], MD Primary care provider: [name], MD  Chief Complaint: Allergic Rhinitis  and Asthma  History of Present Illness: I had the pleasure of seeing Brittany Riggs for a follow up visit at the [facility] on 03/07/2021. She is a 50 y.o. female, who is being followed for adverse drug reaction, food allergy, asthma, allergic rhinitis. Her previous allergy office visit was on 03/21/2020 with Dr. Ernst Bowler. Today is a regular follow up visit.  Asthma: Patient had Covid-19 in September 2021 and was hospitalized for this - she did have a course of prednisone and remdesivir. Now back to baseline.  Currently on Dulera 233mcg 2 puffs twice a day and only using albuterol during exertion.  Still taking montelukast at night.   Seasonal and perennial allergic rhinitis Sneezing a few days ago. Tolerating allergy injections with some localized reactions to the grass pollen. Patient has noticed improvement already. Takes Claritin daily and takes and additional allegra if needed. Not using any nasal sprays. Uses Arlex as needed.  Vaccine reaction Patient got [place] one dose and then she got both Moderna dose in 2022 with no issues.  Food allergy Avoiding shellfish but tolerates some finned fish with no issues. Tolerates small amounts of marinara as well. Has not had sesame yet.  09/01/2020 hospitalization: "Acute Hypoxic Resp Failure due to Covid 19 Viral pneumonia: Partially vaccinated due to anaphylaxis after 1 dose of pfizer in march 2021 - she's planning to get J&J when eligible  CXR with bibasilar atelectasis Currently on RA, appears to be improving Continue steroids, remdesivir- will complete remdesivir today, d/c with steroid taper Strict I/O, daily weights Prone as able, OOB, IS, flutter"  Assessment and Plan: Brittany Riggs is a  50 y.o. female with: Moderate persistent asthma without complication Had UXLKG-40 in September 2021 requiring hospitalization and treated with prednisone and remdesivir. Breathing back to baseline.   Today's spirometry shows some restriction most likely due to body habitus.  Daily controller medication(s):Dulera 280mcg 2 puffs twice a day with spacer and rinse mouth afterwards.  ? Continue Singulair 10mg  daily at night.  ? May take 1/2 tablet at night and see if it makes you less tired.  Prior to physical activity:May use albuterol rescue inhaler 2 puffs 5 to 15 minutes prior to strenuous physical activities.  Rescue medications:May use albuterol rescue inhaler 2 puffs or nebulizer every 4 to 6 hours as needed for shortness of breath, chest tightness, coughing, and wheezing. Monitor frequency of use.   During upper respiratory infections: Start Qvar 6mcg 2 puffs twice a day for 1-2 weeks. Rinse mouth afterwards.  Seasonal and perennial allergic rhinitis Past history - started AIT on 07/02/2016 (G/Weeds/T + M/DM/C/CR). Nasal sprays cause epistaxis. Patient has dry eyes.  Interim history - stable. Injections are helping.   Continue allergy injections - given today.   Continue environmental control measures.  May use over the counter antihistamines such as Zyrtec (cetirizine), Claritin (loratadine), Allegra (fexofenadine), or Xyzal (levocetirizine) daily as needed. May take twice a day during flares.   Continue Singulair 10mg  daily at night.  May use nasal saline spray (i.e., Simply Saline) as needed.  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 2021 bloodwork borderline positive to shellfish. Interim history - No reactions since last visit.   Continue to avoid shellfish and sesame for  now.   For mild symptoms you can take over the counter antihistamines such as Benadryl and monitor symptoms closely. If symptoms worsen or if you have  severe symptoms including breathing issues, throat closure, significant swelling, whole body hives, severe diarrhea and vomiting, lightheadedness then inject epinephrine and seek immediate medical care afterwards.  Drug reaction Past history - Developed throat pruritus and felt throat swelling with difficulty swallowing after receiving Pfizer COVID-19 vaccine on 02/19/20. Treated with benadryl and felt better the following day. She works with polysorbates and has noted some itching when coming in contact with the material. Some large localized swelling after flu vaccines.  Interim history - Patient had 2 Moderna vaccines since last OV with no reactions.  Return in about 6 months (around 09/07/2021).  Meds ordered this encounter  Medications  . EPINEPHrine (AUVI-Q) 0.3 mg/0.3 mL IJ SOAJ injection    Sig: Inject 0.3 mg into the muscle as needed for anaphylaxis.    Dispense:  1 each    Refill:  1   Lab Orders  No laboratory test(s) ordered today    Diagnostics: Spirometry:  Tracings reviewed. Her effort: It was hard to get consistent efforts and there is a question as to whether this reflects a maximal maneuver. FVC: 2.67L FEV1 1.97L, 71% predicted FEV1/FVC ratio: 74% Interpretation: Spirometry consistent with possible restrictive disease.  Please see scanned spirometry results for details.  Medication List:  Current Outpatient Medications  Medication Sig Dispense Refill  . albuterol (PROAIR HFA) 108 (90 Base) MCG/ACT inhaler Inhale 2 puffs into the lungs every 6 (six) hours as needed for wheezing or shortness of breath. 18 g 0  . amLODipine (NORVASC) 2.5 MG tablet Take 2.5 mg by mouth daily.     . carvedilol (COREG) 3.125 MG tablet Take 3.125 mg by mouth 2 (two) times daily with a meal.     . cetirizine (ZYRTEC) 10 MG tablet Take 10 mg by mouth at bedtime as needed for allergies or rhinitis.    . Cholecalciferol (VITAMIN D3) 5000 units TABS Take 5,000 Units by mouth daily.     Marland Kitchen  CONTRAVE 8-90 MG TB12 Take 2 tablets by mouth 2 (two) times daily.    Marland Kitchen EPINEPHrine (AUVI-Q) 0.3 mg/0.3 mL IJ SOAJ injection Inject 0.3 mg into the muscle as needed for anaphylaxis. 1 each 1  . Eszopiclone 3 MG TABS Take 3 mg by mouth at bedtime as needed (sleep). Take immediately before bedtime    . hydroxychloroquine (PLAQUENIL) 200 MG tablet Take 200 mg by mouth daily. 2 tabs daily    . JOLESSA 0.15-0.03 MG tablet Take 1 tablet by mouth daily.    . Lifitegrast 5 % SOLN Place 1 drop into both eyes 2 (two) times daily.    Marland Kitchen loperamide (IMODIUM) 2 MG capsule Take 1 capsule (2 mg total) by mouth 4 (four) times daily as needed for diarrhea or loose stools. 20 capsule 0  . loratadine (CLARITIN) 10 MG tablet Take 10 mg by mouth in the morning. Reported on 06/23/2016    . metroNIDAZOLE (METROCREAM) 0.75 % cream Apply 1 application topically See admin instructions.     . mometasone-formoterol (DULERA) 200-5 MCG/ACT AERO Inhale 2 puffs into the lungs 2 (two) times daily. 13 g 1  . montelukast (SINGULAIR) 10 MG tablet Take 1 tablet (10 mg total) by mouth at bedtime. 90 tablet 1  . naratriptan (AMERGE) 2.5 MG tablet Take 2.5 mg by mouth as needed for migraine.   5  .  omeprazole (PRILOSEC) 40 MG capsule Take 1 capsule (40 mg total) by mouth daily. 90 capsule 2  . ondansetron (ZOFRAN ODT) 4 MG disintegrating tablet Take 1 tablet (4 mg total) by mouth every 8 (eight) hours as needed for nausea or vomiting. 21 tablet 0  . Respiratory Therapy Supplies (FLUTTER) DEVI Use as directed 1 each 0  . tiZANidine (ZANAFLEX) 2 MG tablet Take 1 tablet (2 mg total) by mouth at bedtime as needed for muscle spasms. 30 tablet 0  . traMADol (ULTRAM) 50 MG tablet 1-2 every 4 hours as needed for cough or pain (Patient taking differently: Take 50-100 mg by mouth every 4 (four) hours as needed (pain). 1-2 every 4 hours as needed for cough or pain) 40 tablet 0  . valACYclovir (VALTREX) 500 MG tablet Take 500 mg by mouth daily.    Marland Kitchen  venlafaxine XR (EFFEXOR-XR) 150 MG 24 hr capsule Take 150 mg by mouth 2 (two) times daily with a meal.      No current facility-administered medications for this visit.   Allergies: Allergies  Allergen Reactions  . Amoxicillin Hives  . Codeine Itching  . Penicillins Hives    Has patient had a PCN reaction causing immediate rash, facial/tongue/throat swelling, SOB or lightheadedness with hypotension: Yes Has patient had a PCN reaction causing severe rash involving mucus membranes or skin necrosis: no Has patient had a PCN reaction that required hospitalization: no Has patient had a PCN reaction occurring within the last 10 years: no If all of the above answers are "NO", then may proceed with Cephalosporin use.   . Prednisone Other (See Comments)    Severe cramps and "spasms"  . Sulfa Antibiotics Other (See Comments)    Does not remember side effects  . Latex Rash and Other (See Comments)    "Sometimes bothers me"   I reviewed her past medical history, social history, family history, and environmental history and no significant changes have been reported from her previous visit.  Review of Systems  Constitutional: Negative for appetite change, chills, fever and unexpected weight change.  HENT: Negative for congestion, rhinorrhea and sneezing.   Eyes: Negative for itching.  Respiratory: Negative for cough, chest tightness, shortness of breath and wheezing.   Gastrointestinal: Negative for abdominal pain.  Skin: Negative for rash.  Allergic/Immunologic: Positive for environmental allergies.  Neurological: Negative for headaches.   Objective: BP 124/82   Pulse 97   Temp 98.1 F (36.7 C) (Temporal)   Resp 16   Ht 5\' 9"  (1.753 m)   Wt (!) 352 lb 6.4 oz (159.8 kg)   SpO2 99%   BMI 52.04 kg/m  Body mass index is 52.04 kg/m. Physical Exam Vitals and nursing note reviewed.  Constitutional:      Appearance: Normal appearance. She is well-developed.  HENT:     Head:  Normocephalic and atraumatic.     Right Ear: Tympanic membrane and external ear normal.     Left Ear: Tympanic membrane and external ear normal.     Nose: Nose normal.     Mouth/Throat:     Mouth: Mucous membranes are moist.     Pharynx: Oropharynx is clear.  Eyes:     Conjunctiva/sclera: Conjunctivae normal.  Cardiovascular:     Rate and Rhythm: Normal rate and regular rhythm.     Heart sounds: Normal heart sounds. No murmur heard.   Pulmonary:     Effort: Pulmonary effort is normal.     Breath sounds: Normal breath sounds.  No wheezing, rhonchi or rales.  Musculoskeletal:     Cervical back: Neck supple.  Skin:    General: Skin is warm.     Findings: No rash.  Neurological:     Mental Status: She is alert and oriented to person, place, and time.  Psychiatric:        Behavior: Behavior normal.    Previous notes and tests were reviewed. The plan was reviewed with the patient/family, and all questions/concerned were addressed.  It was my pleasure to see Brittany Riggs today and participate in her care. Please feel free to contact me with any questions or concerns.  Sincerely,  Rexene Alberts, DO Allergy & Immunology  [facility] office: [place] office: [phone]

## 2021-03-07 NOTE — Assessment & Plan Note (Signed)
Past history - started AIT on 07/02/2016 (G/Weeds/T + M/DM/C/CR). Nasal sprays cause epistaxis. Patient has dry eyes.  Interim history - stable. Injections are helping.   Continue allergy injections - given today.   Continue environmental control measures.  May use over the counter antihistamines such as Zyrtec (cetirizine), Claritin (loratadine), Allegra (fexofenadine), or Xyzal (levocetirizine) daily as needed. May take twice a day during flares.   Continue Singulair 10mg  daily at night.  May use nasal saline spray (i.e., Simply Saline) as needed.

## 2021-03-11 NOTE — Telephone Encounter (Signed)
Dr Vaughan Browner, please advise on pt email:   My family physician retrieved my sleep study results from [facility] and it stated that I have mild abnormalities. So there's no need for a CPAP machine.  Please ask Dr. Vaughan Browner what alternatives do I have to be able to sleep soundly and stay asleep? I don't sleep well. I toss and turn throughout the night. I get up in the mornings tired.  Thanks Alberteen Spindle

## 2021-03-12 NOTE — Telephone Encounter (Signed)
The study does not show significant sleep apnea. We can offer an appointment with one of our sleep specialists to consult and give advice.

## 2021-03-14 ENCOUNTER — Ambulatory Visit (INDEPENDENT_AMBULATORY_CARE_PROVIDER_SITE_OTHER): Payer: 59

## 2021-03-14 DIAGNOSIS — J309 Allergic rhinitis, unspecified: Secondary | ICD-10-CM | POA: Diagnosis not present

## 2021-03-19 ENCOUNTER — Ambulatory Visit (INDEPENDENT_AMBULATORY_CARE_PROVIDER_SITE_OTHER): Payer: 59

## 2021-03-19 DIAGNOSIS — J309 Allergic rhinitis, unspecified: Secondary | ICD-10-CM

## 2021-04-14 ENCOUNTER — Telehealth: Payer: Self-pay

## 2021-04-14 ENCOUNTER — Other Ambulatory Visit: Payer: Self-pay | Admitting: *Deleted

## 2021-04-14 MED ORDER — ALBUTEROL SULFATE HFA 108 (90 BASE) MCG/ACT IN AERS: 2.0000 | INHALATION_SPRAY | Freq: Four times a day (QID) | RESPIRATORY_TRACT | 1 refills | Status: DC | PRN

## 2021-04-14 MED ORDER — MOMETASONE FURO-FORMOTEROL FUM 200-5 MCG/ACT IN AERO
2.0000 | INHALATION_SPRAY | Freq: Two times a day (BID) | RESPIRATORY_TRACT | 5 refills | Status: DC
Start: 2021-04-14 — End: 2021-09-10

## 2021-04-14 NOTE — Telephone Encounter (Signed)
Refills have been sent in. Called and advised patient. Patient verbalized understanding.

## 2021-04-14 NOTE — Telephone Encounter (Signed)
Patient called requesting a refill for Dulera 200 & Albuterol Inhaler.  Patient is out of her refills.   [facility] on [address]

## 2021-04-23 ENCOUNTER — Ambulatory Visit (INDEPENDENT_AMBULATORY_CARE_PROVIDER_SITE_OTHER): Payer: 59

## 2021-04-23 DIAGNOSIS — J309 Allergic rhinitis, unspecified: Secondary | ICD-10-CM

## 2021-05-16 ENCOUNTER — Inpatient Hospital Stay: Admission: RE | Admit: 2021-05-16 | Payer: 59 | Source: Ambulatory Visit

## 2021-05-16 ENCOUNTER — Ambulatory Visit (INDEPENDENT_AMBULATORY_CARE_PROVIDER_SITE_OTHER): Payer: 59

## 2021-05-16 DIAGNOSIS — J309 Allergic rhinitis, unspecified: Secondary | ICD-10-CM

## 2021-06-02 ENCOUNTER — Ambulatory Visit (INDEPENDENT_AMBULATORY_CARE_PROVIDER_SITE_OTHER): Payer: 59

## 2021-06-02 DIAGNOSIS — J309 Allergic rhinitis, unspecified: Secondary | ICD-10-CM

## 2021-06-11 ENCOUNTER — Encounter: Payer: Self-pay | Admitting: Podiatry

## 2021-06-11 ENCOUNTER — Ambulatory Visit (INDEPENDENT_AMBULATORY_CARE_PROVIDER_SITE_OTHER): Payer: 59 | Admitting: Podiatry

## 2021-06-11 ENCOUNTER — Other Ambulatory Visit: Payer: Self-pay

## 2021-06-11 DIAGNOSIS — M722 Plantar fascial fibromatosis: Secondary | ICD-10-CM

## 2021-06-11 MED ORDER — TRIAMCINOLONE ACETONIDE 10 MG/ML IJ SUSP
10.0000 mg | Freq: Once | INTRAMUSCULAR | Status: AC
  Administered 2021-06-11: 11:00:00 10 mg

## 2021-06-11 NOTE — Progress Notes (Signed)
Subjective:   Patient ID: Brittany Riggs, female   DOB: 49 y.o.   MRN: [id number]   HPI Patient states she is developed a lot of pain in her left arch recently and she has been treated by me in the past and states that she is in the process of losing weight and is getting more active   ROS      Objective:  Physical Exam  Neurovascular status intact with patient's left mid arch found to be quite inflamed and painful and patient is noted to have no other current discomforts     Assessment:  Acute Planter fasciitis left mid arch     Plan:  H&P x-ray reviewed sterile prep injected the mid arch left 3 mg Kenalog 5 mg Xylocaine advised on fascial bracing which was applied today along with support shoes and reappoint to recheck  X-rays indicate that there is moderate depression of the arch no indications of bony pathology

## 2021-07-02 ENCOUNTER — Other Ambulatory Visit: Payer: Self-pay | Admitting: Family Medicine

## 2021-07-02 DIAGNOSIS — Z1231 Encounter for screening mammogram for malignant neoplasm of breast: Secondary | ICD-10-CM

## 2021-07-04 ENCOUNTER — Other Ambulatory Visit (HOSPITAL_BASED_OUTPATIENT_CLINIC_OR_DEPARTMENT_OTHER): Payer: Self-pay

## 2021-07-04 ENCOUNTER — Ambulatory Visit (INDEPENDENT_AMBULATORY_CARE_PROVIDER_SITE_OTHER): Payer: 59

## 2021-07-04 DIAGNOSIS — J309 Allergic rhinitis, unspecified: Secondary | ICD-10-CM

## 2021-07-04 DIAGNOSIS — R0683 Snoring: Secondary | ICD-10-CM

## 2021-07-04 DIAGNOSIS — G4719 Other hypersomnia: Secondary | ICD-10-CM

## 2021-07-04 DIAGNOSIS — G478 Other sleep disorders: Secondary | ICD-10-CM

## 2021-07-08 ENCOUNTER — Ambulatory Visit
Admission: RE | Admit: 2021-07-08 | Discharge: 2021-07-08 | Disposition: A | Discharge status: Home / Self Care | Payer: 59 | Source: Ambulatory Visit | Attending: Family Medicine | Admitting: Family Medicine

## 2021-07-08 ENCOUNTER — Other Ambulatory Visit: Payer: Self-pay

## 2021-07-08 DIAGNOSIS — Z1231 Encounter for screening mammogram for malignant neoplasm of breast: Secondary | ICD-10-CM

## 2021-07-11 ENCOUNTER — Ambulatory Visit: Payer: 59

## 2021-07-19 IMAGING — DX DG CHEST 1V PORT
1 series · 1 of 1 positions shown · non-contrast
Comparison: 03/12/2016

CLINICAL DATA: COVID, hypoxia,

EXAM:
PORTABLE CHEST 1 VIEW

[chest ap]
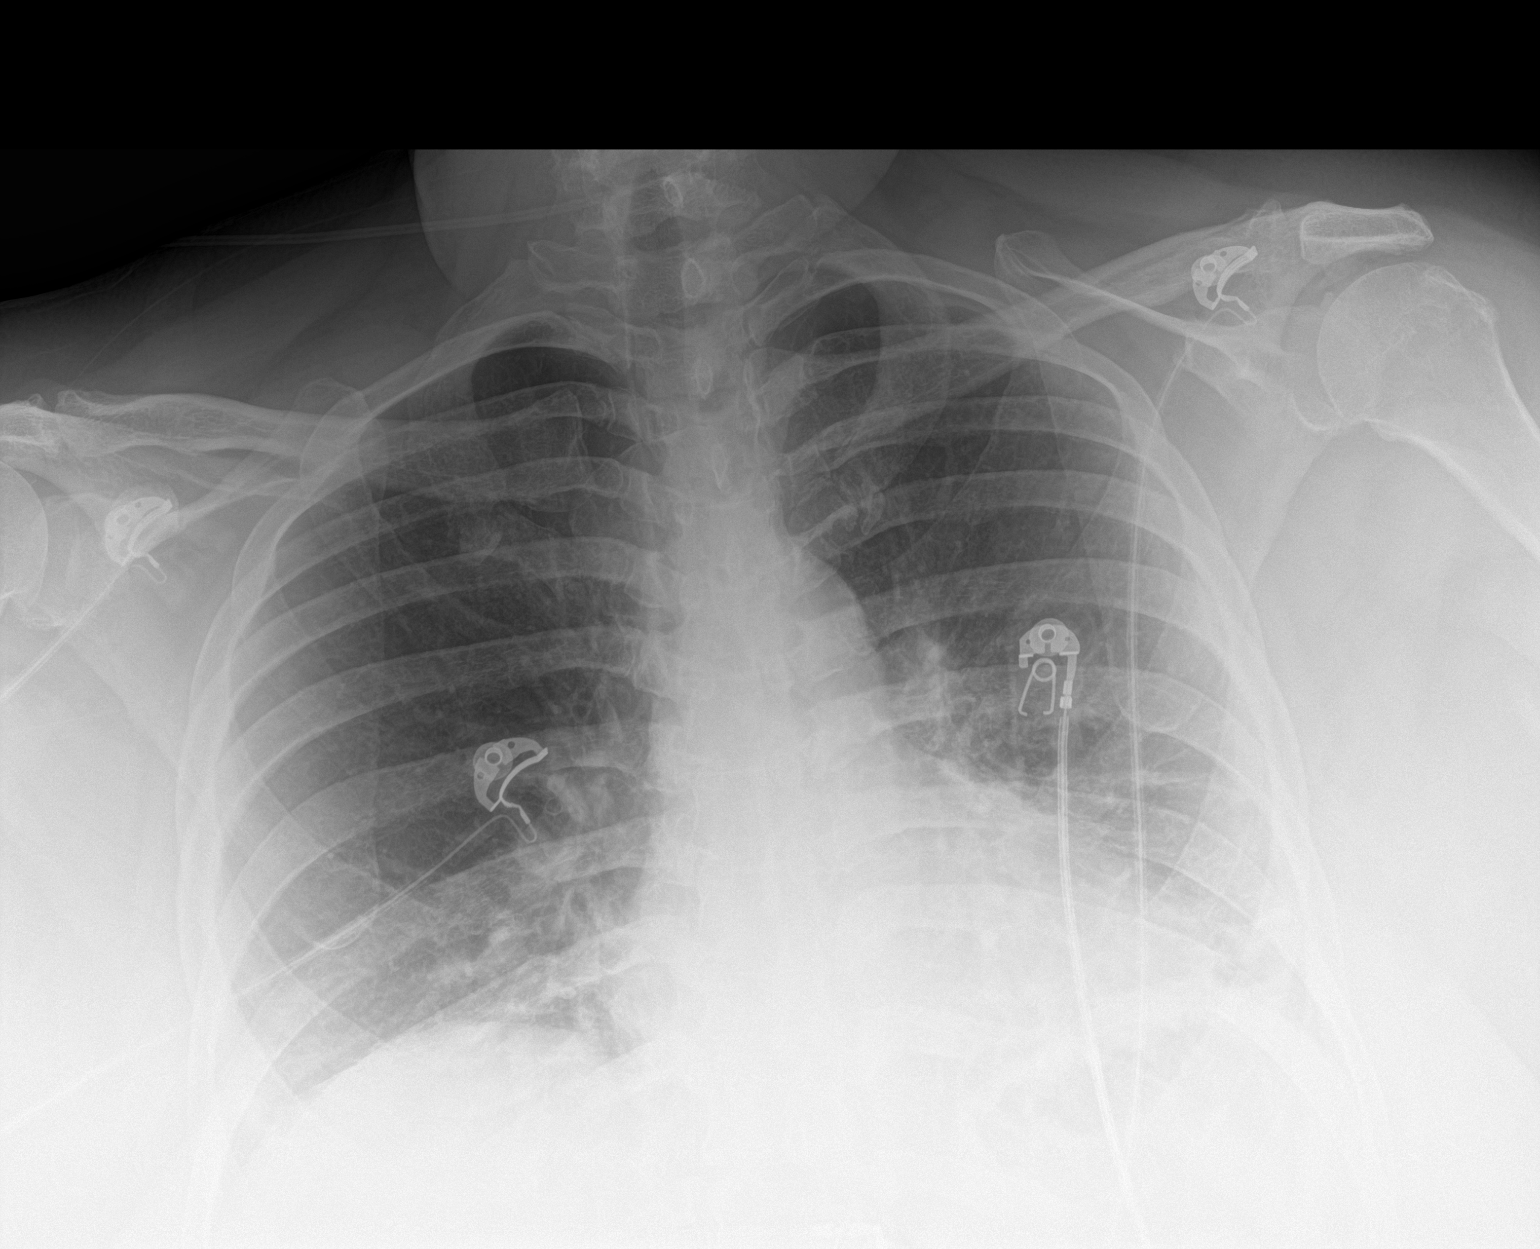

[1 of 1 positions shown; findings below may reference images not displayed]

FINDINGS: Lung volumes are small and there is bibasilar atelectasis. No
pneumothorax or pleural effusion. Cardiac size within normal limits.
Pulmonary vascularity normal. No acute bone abnormality.
IMPRESSION: Bibasilar atelectasis.

## 2021-07-24 DIAGNOSIS — J301 Allergic rhinitis due to pollen: Secondary | ICD-10-CM | POA: Diagnosis not present

## 2021-07-24 NOTE — Progress Notes (Signed)
VIALS MADE. EXP 07-24-22

## 2021-08-01 ENCOUNTER — Ambulatory Visit (INDEPENDENT_AMBULATORY_CARE_PROVIDER_SITE_OTHER): Payer: 59

## 2021-08-01 DIAGNOSIS — J309 Allergic rhinitis, unspecified: Secondary | ICD-10-CM

## 2021-08-25 ENCOUNTER — Telehealth: Payer: Self-pay | Admitting: Allergy

## 2021-08-25 NOTE — Telephone Encounter (Signed)
Patient takes [place]. She said they are not working very well and she is still coughing. She would like to know if she could try a sample of Trelogy?

## 2021-08-25 NOTE — Telephone Encounter (Signed)
Please call patient and have her move her follow up appointment to 9/14. I have a few openings that day to discuss further.  She was not seen since March 2022.  Thank you.

## 2021-08-26 NOTE — Telephone Encounter (Signed)
Patient was moved to 08/27/21 at 1:30 as a tele-visit. Patient was not able to be seen in the 3pm spot she has a work Building services engineer.  She was not able to talk much she lost her voice.

## 2021-08-27 ENCOUNTER — Encounter: Payer: Self-pay | Admitting: Allergy

## 2021-08-27 ENCOUNTER — Other Ambulatory Visit: Payer: Self-pay

## 2021-08-27 ENCOUNTER — Ambulatory Visit (INDEPENDENT_AMBULATORY_CARE_PROVIDER_SITE_OTHER): Payer: 59 | Admitting: Allergy

## 2021-08-27 VITALS — BP 144/94 | HR 113 | Temp 98.1°F | Resp 20 | Ht 69.0 in | Wt 368.5 lb

## 2021-08-27 DIAGNOSIS — J45901 Unspecified asthma with (acute) exacerbation: Secondary | ICD-10-CM

## 2021-08-27 DIAGNOSIS — J302 Other seasonal allergic rhinitis: Secondary | ICD-10-CM | POA: Diagnosis not present

## 2021-08-27 DIAGNOSIS — J3089 Other allergic rhinitis: Secondary | ICD-10-CM | POA: Diagnosis not present

## 2021-08-27 DIAGNOSIS — T7800XD Anaphylactic reaction due to unspecified food, subsequent encounter: Secondary | ICD-10-CM

## 2021-08-27 MED ORDER — QVAR REDIHALER 80 MCG/ACT IN AERB: 2.0000 | INHALATION_SPRAY | Freq: Two times a day (BID) | RESPIRATORY_TRACT | 3 refills | Status: DC

## 2021-08-27 MED ORDER — ALBUTEROL SULFATE (2.5 MG/3ML) 0.083% IN NEBU: 2.5000 mg | INHALATION_SOLUTION | RESPIRATORY_TRACT | 1 refills | Status: DC | PRN

## 2021-08-27 MED ORDER — METHYLPREDNISOLONE ACETATE 40 MG/ML IJ SUSP
40.0000 mg | Freq: Once | INTRAMUSCULAR | Status: AC
Start: 2021-08-27 — End: 2021-08-27
  Administered 2021-08-27: 40 mg via INTRAMUSCULAR

## 2021-08-27 NOTE — Progress Notes (Signed)
Follow Up Note  RE: Brittany Riggs MRN: [id number] DOB: 1971-08-06 Date of Office Visit: 08/27/2021  Referring provider: [name], MD Primary care provider: [name], MD  Chief Complaint: asthma flare  History of Present Illness: I had the pleasure of seeing Brittany Riggs for a follow up visit at the [facility] on 08/27/2021. She is a 50 y.o. female, who is being followed for asthma, allergic rhinitis, food allergy. Her previous allergy office visit was on 03/07/2021 with Dr. [name]. Today is a new complaint visit of not feeling well .  Patient has been coughing with some clear mucous, shortness of breath, fatigue for about 3 weeks now.  Patient doesn't believe her inhalers are working as well.  Denies any fevers. Some chills. No sick contacts but some of her co-workers have been out with covid.   Patient did do at home covid-19 testing yesterday which was negative. Currently on Dulera 226mg 2 puffs twice a day and using albuterol every 4-6 hours with some benefit. Also using Qvar 871m 2 puffs BID.  No prednisone since the last visit.  Her insurance is requiring mail [facility]   Seasonal and perennial allergic rhinitis Currently taking Claritin in the morning, allegra at night. No nasal sprays due to epistaxis.  Some rhinorrhea.  Assessment and Plan: ShDenisias a [age].o. female with: Asthma with acute exacerbation 3 week history of coughing, shortness of breath and fatigue. Negative at home covid-19 test yesterday but co-workers had covid. No fevers. Does not think inhalers are working as well. Coughing resolved after duoneb treatment in the office.  Depo '40mg'$  given today - patient does not tolerate oral prednisone.  For the next 5 days, use albuterol nebulizer every 4-6 hours while awake then use as needed. If you need to use the nebulizer machine back to back for 2 times and your breathing is still not controlled then go  to the ER! Daily controller medication(s):  Start Trelegy 2009m1 puff daily and rinse mouth after each use. Samples given. Demonstrated proper use. Stop Dulera for now.  Continue Singulair '10mg'$  daily at night.  During upper respiratory infections: Start Qvar 52m64m puffs twice a day for 1-2 weeks. Rinse mouth afterwards.  May use albuterol rescue inhaler 2 puffs or nebulizer every 4 to 6 hours as needed for shortness of breath, chest tightness, coughing, and wheezing. May use albuterol rescue inhaler 2 puffs 5 to 15 minutes prior to strenuous physical activities. Monitor frequency of use.  Nebulizer machine given. Get spirometry at next visit.  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 2021 bloodwork borderline positive to shellfish. Interim history - No reactions since last visit.  Continue to avoid shellfish and sesame for now.  For mild symptoms you can take over the counter antihistamines such as Benadryl and monitor symptoms closely. If symptoms worsen or if you have severe symptoms including breathing issues, throat closure, significant swelling, whole body hives, severe diarrhea and vomiting, lightheadedness then inject epinephrine and seek immediate medical care afterwards.  Seasonal and perennial allergic rhinitis Past history - started AIT on 07/02/2016 (G/Weeds/T + M/DM/C/CR). Nasal sprays cause epistaxis. Patient has dry eyes.  Interim history - increased rhinorrhea.  Continue allergy injections - restart when feeling better.  Continue environmental control measures.  May use over the counter antihistamines such as Zyrtec (cetirizine), Claritin (loratadine), Allegra (fexofenadine), or Xyzal (levocetirizine) daily as needed. May  take twice a day during flares.  Continue Singulair '10mg'$  daily at night.  May use nasal saline spray (i.e., Simply Saline) as needed. Declines nasal sprays due to epistaxis issues.   Return in about 2 weeks  (around 09/10/2021).  Meds ordered this encounter  Medications   albuterol (PROVENTIL) (2.5 MG/3ML) 0.083% nebulizer solution    Sig: Take 3 mLs (2.5 mg total) by nebulization every 4 (four) hours as needed for wheezing or shortness of breath (coughing fits).    Dispense:  150 mL    Refill:  1   beclomethasone (QVAR REDIHALER) 80 MCG/ACT inhaler    Sig: Inhale 2 puffs into the lungs 2 (two) times daily. Rinse mouth after each use.    Dispense:  1 each    Refill:  3   methylPREDNISolone acetate (DEPO-MEDROL) injection 40 mg    Lab Orders  No laboratory test(s) ordered today    Diagnostics: None.  Medication List:  Current Outpatient Medications  Medication Sig Dispense Refill   albuterol (PROAIR HFA) 108 (90 Base) MCG/ACT inhaler Inhale 2 puffs into the lungs every 6 (six) hours as needed for wheezing or shortness of breath. 18 g 1   albuterol (PROVENTIL) (2.5 MG/3ML) 0.083% nebulizer solution Take 3 mLs (2.5 mg total) by nebulization every 4 (four) hours as needed for wheezing or shortness of breath (coughing fits). 150 mL 1   Ascorbic Acid (VITAMIN C) 1000 MG tablet 1 tablet     beclomethasone (QVAR REDIHALER) 80 MCG/ACT inhaler Inhale 2 puffs into the lungs 2 (two) times daily. Rinse mouth after each use. 1 each 3   Beclomethasone Dipropionate (QNASL) 80 MCG/ACT AERS 2 sprays     carvedilol (COREG) 3.125 MG tablet Take 3.125 mg by mouth 2 (two) times daily with a meal.      cetirizine (ZYRTEC) 10 MG tablet Take 10 mg by mouth at bedtime as needed for allergies or rhinitis.     chlorthalidone (HYGROTON) 25 MG tablet 1 tablet in the morning with food     Cholecalciferol (VITAMIN D3) 5000 units TABS Take 5,000 Units by mouth daily.      EPINEPHrine (AUVI-Q) 0.3 mg/0.3 mL IJ SOAJ injection Inject 0.3 mg into the muscle as needed for anaphylaxis. 1 each 1   Eszopiclone 3 MG TABS Take 3 mg by mouth at bedtime as needed (sleep). Take immediately before bedtime     fexofenadine (ALLEGRA)  180 MG tablet 1 tablet     hydrocortisone (ANUSOL-HC) 2.5 % rectal cream 1 application to affected area     hydroxychloroquine (PLAQUENIL) 200 MG tablet Take 200 mg by mouth daily. 2 tabs daily     hydroxychloroquine (PLAQUENIL) 200 MG tablet See admin instructions.     irbesartan (AVAPRO) 300 MG tablet 1 tablet     JOLESSA 0.15-0.03 MG tablet Take 1 tablet by mouth daily.     levonorgestrel-ethinyl estradiol (SEASONALE) 0.15-0.03 MG tablet 1 tablet     Lifitegrast 5 % SOLN Place 1 drop into both eyes 2 (two) times daily.     loperamide (IMODIUM) 2 MG capsule Take 1 capsule (2 mg total) by mouth 4 (four) times daily as needed for diarrhea or loose stools. 20 capsule 0   loratadine (CLARITIN) 10 MG tablet Take 10 mg by mouth in the morning. Reported on 06/23/2016     metaxalone (SKELAXIN) 800 MG tablet 1 tablet     metroNIDAZOLE (METROCREAM) 0.75 % cream Apply 1 application topically See admin instructions.  mometasone-formoterol (DULERA) 200-5 MCG/ACT AERO Inhale 2 puffs into the lungs 2 (two) times daily. 13 g 5   montelukast (SINGULAIR) 10 MG tablet Take 1 tablet (10 mg total) by mouth at bedtime. 90 tablet 1   montelukast (SINGULAIR) 10 MG tablet 1 tablet in the evening     naratriptan (AMERGE) 2.5 MG tablet Take 2.5 mg by mouth as needed for migraine.   5   NURTEC 75 MG TBDP Take 1 tablet by mouth daily as needed.     omeprazole (PRILOSEC) 20 MG capsule 1 capsule     omeprazole (PRILOSEC) 40 MG capsule Take 1 capsule (40 mg total) by mouth daily. 90 capsule 2   ondansetron (ZOFRAN ODT) 4 MG disintegrating tablet Take 1 tablet (4 mg total) by mouth every 8 (eight) hours as needed for nausea or vomiting. 21 tablet 0   pantoprazole (PROTONIX) 40 MG tablet Take 40 mg by mouth daily.     Potassium Gluconate 2 MEQ TABS 1 tablet     predniSONE (DELTASONE) 5 MG tablet as directed for RA flare     promethazine-dextromethorphan (PROMETHAZINE-DM) 6.25-15 MG/5ML syrup 5 ml as needed      Respiratory Therapy Supplies (FLUTTER) DEVI Use as directed 1 each 0   Rimegepant Sulfate (NURTEC) 75 MG TBDP      Semaglutide, 1 MG/DOSE, (OZEMPIC, 1 MG/DOSE,) 4 MG/3ML SOPN 0.5 mg     Semaglutide-Weight Management (WEGOVY) 1 MG/0.5ML SOAJ 0.5 ml     tiZANidine (ZANAFLEX) 2 MG tablet Take 1 tablet (2 mg total) by mouth at bedtime as needed for muscle spasms. 30 tablet 0   traMADol (ULTRAM) 50 MG tablet 1-2 every 4 hours as needed for cough or pain (Patient taking differently: Take 50-100 mg by mouth every 4 (four) hours as needed (pain). 1-2 every 4 hours as needed for cough or pain) 40 tablet 0   valACYclovir (VALTREX) 500 MG tablet Take 500 mg by mouth daily.     venlafaxine XR (EFFEXOR-XR) 150 MG 24 hr capsule Take 150 mg by mouth 2 (two) times daily with a meal.      Vitamin D, Ergocalciferol, 50000 units CAPS 1 capsule     CONTRAVE 8-90 MG TB12 Take 2 tablets by mouth 2 (two) times daily.     No current facility-administered medications for this visit.   Allergies: Allergies  Allergen Reactions   Amoxicillin Hives   Codeine Itching   Penicillins Hives    Has patient had a PCN reaction causing immediate rash, facial/tongue/throat swelling, SOB or lightheadedness with hypotension: Yes Has patient had a PCN reaction causing severe rash involving mucus membranes or skin necrosis: no Has patient had a PCN reaction that required hospitalization: no Has patient had a PCN reaction occurring within the last 10 years: no If all of the above answers are "NO", then may proceed with Cephalosporin use.    Prednisone Other (See Comments)    Severe cramps and "spasms"   Sulfa Antibiotics Other (See Comments)    Does not remember side effects   Latex Rash and Other (See Comments)    "Sometimes bothers me"   I reviewed her past medical history, social history, family history, and environmental history and no significant changes have been reported from her previous visit.  Review of Systems   Constitutional:  Negative for appetite change, chills, fever and unexpected weight change.  HENT:  Positive for rhinorrhea. Negative for congestion and sneezing.   Eyes:  Negative for itching.  Respiratory:  Positive for cough, chest tightness, shortness of breath and wheezing.   Gastrointestinal:  Negative for abdominal pain.  Skin:  Negative for rash.  Allergic/Immunologic: Positive for environmental allergies.  Neurological:  Negative for headaches.   Objective: BP (!) 144/94   Pulse (!) 113   Temp 98.1 F (36.7 C) (Temporal)   Resp 20   Ht '5\' 9"'$  (1.753 m)   Wt (!) 368 lb 8 oz (167.2 kg)   SpO2 98%   BMI 54.42 kg/m  Body mass index is 54.42 kg/m. Physical Exam Vitals and nursing note reviewed.  Constitutional:      Appearance: Normal appearance. She is well-developed.  HENT:     Head: Normocephalic and atraumatic.     Right Ear: Tympanic membrane and external ear normal.     Left Ear: Tympanic membrane and external ear normal.     Nose: Nose normal.     Mouth/Throat:     Mouth: Mucous membranes are moist.     Pharynx: Oropharynx is clear.  Eyes:     Conjunctiva/sclera: Conjunctivae normal.  Cardiovascular:     Rate and Rhythm: Normal rate and regular rhythm.     Heart sounds: Normal heart sounds. No murmur heard. Pulmonary:     Effort: Pulmonary effort is normal.     Breath sounds: Normal breath sounds. No wheezing, rhonchi or rales.     Comments: Coughing throughout Musculoskeletal:     Cervical back: Neck supple.  Skin:    General: Skin is warm.     Findings: No rash.  Neurological:     Mental Status: She is alert and oriented to person, place, and time.  Psychiatric:        Behavior: Behavior normal.   Previous notes and tests were reviewed. The plan was reviewed with the patient/family, and all questions/concerned were addressed.  It was my pleasure to see Soraya today and participate in her care. Please feel free to contact me with any questions or  concerns.  Sincerely,  Rexene Alberts, DO Allergy & Immunology  [facility] office: [place] office: [phone]

## 2021-08-27 NOTE — Patient Instructions (Addendum)
Asthma:  Nebulizer treatment given today. Steroid injection given today. For the next 5 days, use albuterol nebulizer every 4-6 hours while awake then use as needed. If you need to use the neblizer machine back to back for 2 times and your breathing is still not controlled then go to the ER!  Daily controller medication(s):  Start Trelegy 247mg 1 puff daily and rinse mouth after each use. Samples given. Demonstrated proper use. Stop Dulera for now.  Continue Singulair '10mg'$  daily at night.  During upper respiratory infections: Start Qvar 855m 2 puffs twice a day for 1-2 weeks. Rinse mouth afterwards.  May use albuterol rescue inhaler 2 puffs or nebulizer every 4 to 6 hours as needed for shortness of breath, chest tightness, coughing, and wheezing. May use albuterol rescue inhaler 2 puffs 5 to 15 minutes prior to strenuous physical activities. Monitor frequency of use.  Nebulizer machine given. Asthma control goals:  Full participation in all desired activities (may need albuterol before activity) Albuterol use two times or less a week on average (not counting use with activity) Cough interfering with sleep two times or less a month Oral steroids no more than once a year No hospitalizations   Seasonal and perennial allergic rhinitis Continue allergy injections - restart when feeling better.  Continue environmental control measures.  May use over the counter antihistamines such as Zyrtec (cetirizine), Claritin (loratadine), Allegra (fexofenadine), or Xyzal (levocetirizine) daily as needed. May take twice a day during flares.  Continue Singulair '10mg'$  daily at night.  May use nasal saline spray (i.e., Simply Saline) as needed.   Food allergy Continue to avoid shellfish and sesame. For mild symptoms you can take over the counter antihistamines such as Benadryl and monitor symptoms closely. If symptoms worsen or if you have severe symptoms including breathing issues, throat closure,  significant swelling, whole body hives, severe diarrhea and vomiting, lightheadedness then inject epinephrine and seek immediate medical care afterwards.  Follow up in 2 weeks or sooner if needed.

## 2021-08-27 NOTE — Assessment & Plan Note (Signed)
Past history -  Sesame causes itching. 2021 bloodwork borderline positive to shellfish. Interim history - No reactions since last visit.   Continue to avoid shellfish and sesame for now.   For mild symptoms you can take over the counter antihistamines such as Benadryl and monitor symptoms closely. If symptoms worsen or if you have severe symptoms including breathing issues, throat closure, significant swelling, whole body hives, severe diarrhea and vomiting, lightheadedness then inject epinephrine and seek immediate medical care afterwards.

## 2021-08-27 NOTE — Assessment & Plan Note (Signed)
3 week history of coughing, shortness of breath and fatigue. Negative at home covid-19 test yesterday but co-workers had covid. No fevers. Does not think inhalers are working as well. . Coughing resolved after duoneb treatment in the office.  . Depo '40mg'$  given today - patient does not tolerate oral prednisone.  . For the next 5 days, use albuterol nebulizer every 4-6 hours while awake then use as needed. o If you need to use the nebulizer machine back to back for 2 times and your breathing is still not controlled then go to the ER! . Daily controller medication(s):  o Start Trelegy 288mg 1 puff daily and rinse mouth after each use. Samples given. Demonstrated proper use. o Stop Dulera for now.  . Continue Singulair '10mg'$  daily at night.  . During upper respiratory infections: Start Qvar 870m 2 puffs twice a day for 1-2 weeks. Rinse mouth afterwards.  . May use albuterol rescue inhaler 2 puffs or nebulizer every 4 to 6 hours as needed for shortness of breath, chest tightness, coughing, and wheezing. May use albuterol rescue inhaler 2 puffs 5 to 15 minutes prior to strenuous physical activities. Monitor frequency of use.  . Nebulizer machine given.  Get spirometry at next visit.

## 2021-08-27 NOTE — Assessment & Plan Note (Signed)
Past history - started AIT on 07/02/2016 (G/Weeds/T + M/DM/C/CR). Nasal sprays cause epistaxis. Patient has dry eyes.  Interim history - increased rhinorrhea.   Continue allergy injections - restart when feeling better.   Continue environmental control measures.   May use over the counter antihistamines such as Zyrtec (cetirizine), Claritin (loratadine), Allegra (fexofenadine), or Xyzal (levocetirizine) daily as needed. May take twice a day during flares.   Continue Singulair '10mg'$  daily at night.   May use nasal saline spray (i.e., Simply Saline) as needed.  Declines nasal sprays due to epistaxis issues.

## 2021-09-08 ENCOUNTER — Ambulatory Visit: Payer: 59 | Admitting: Allergy

## 2021-09-10 ENCOUNTER — Encounter: Payer: Self-pay | Admitting: Allergy

## 2021-09-10 ENCOUNTER — Other Ambulatory Visit: Payer: Self-pay

## 2021-09-10 ENCOUNTER — Ambulatory Visit (INDEPENDENT_AMBULATORY_CARE_PROVIDER_SITE_OTHER): Payer: 59 | Admitting: Allergy

## 2021-09-10 VITALS — BP 134/80 | HR 132 | Temp 97.1°F | Resp 16 | Ht 68.0 in | Wt 358.0 lb

## 2021-09-10 DIAGNOSIS — J3089 Other allergic rhinitis: Secondary | ICD-10-CM

## 2021-09-10 DIAGNOSIS — T7800XD Anaphylactic reaction due to unspecified food, subsequent encounter: Secondary | ICD-10-CM

## 2021-09-10 DIAGNOSIS — J302 Other seasonal allergic rhinitis: Secondary | ICD-10-CM

## 2021-09-10 DIAGNOSIS — J4550 Severe persistent asthma, uncomplicated: Secondary | ICD-10-CM

## 2021-09-10 MED ORDER — AZITHROMYCIN 250 MG PO TABS: ORAL_TABLET | ORAL | 0 refills | Status: DC

## 2021-09-10 MED ORDER — BENZONATATE 100 MG PO CAPS: 200.0000 mg | ORAL_CAPSULE | Freq: Three times a day (TID) | ORAL | 0 refills | Status: DC | PRN

## 2021-09-10 MED ORDER — BUDESONIDE 0.5 MG/2ML IN SUSP: 0.5000 mg | Freq: Two times a day (BID) | RESPIRATORY_TRACT | 1 refills | Status: DC

## 2021-09-10 NOTE — Assessment & Plan Note (Addendum)
Feeling 50% improved but now has some discolored phlegm. Denies any fevers/chills. Does not like to take oral prednisone or cough medicine with codeine. . Coughing resolved after duoneb treatment in the office.  . Start azithromycin.  . For the next 7 days: o Start budesonide nebulizer twice a day. o If you need to use the albuterol nebulizer machine back to back for 2 times and your breathing is still not controlled then go to the ER! . May take tessalon perles as needed for coughing. . Letter written to work from home this week and next week. . Daily controller medication(s):  o Continue Trelegy 25mcg 1 puff daily and rinse mouth after each use.  . Continue Singulair 10mg  daily at night.  . During upper respiratory infections: Start budesonide 0.5mg  nebulizer twice a day for 7 days. . May use albuterol rescue inhaler 2 puffs or nebulizer every 4 to 6 hours as needed for shortness of breath, chest tightness, coughing, and wheezing. May use albuterol rescue inhaler 2 puffs 5 to 15 minutes prior to strenuous physical activities. Monitor frequency of use.  . Get bloodwork to see if you qualify for biologics. . Get spirometry at next visit.

## 2021-09-10 NOTE — Assessment & Plan Note (Signed)
Past history -  Sesame causes itching. 2021 bloodwork borderline positive to shellfish.  Continue to avoid shellfish and sesame.  For mild symptoms you can take over the counter antihistamines such as Benadryl and monitor symptoms closely. If symptoms worsen or if you have severe symptoms including breathing issues, throat closure, significant swelling, whole body hives, severe diarrhea and vomiting, lightheadedness then inject epinephrine and seek immediate medical care afterwards.

## 2021-09-10 NOTE — Progress Notes (Signed)
Follow Up Note  RE: Brittany Riggs MRN: [id number] DOB: 04/28/1971 Date of Office Visit: 09/10/2021  Referring provider: [name], MD Primary care provider: [name], MD  Chief Complaint: Follow-up (Patient in for a 2 week follow up and states she is feeling better but still having the cough.)  History of Present Illness: I had the pleasure of seeing Brittany Riggs for a follow up visit at the [facility] [place] on 09/10/2021. She is a 50 y.o. female, who is being followed for asthma, food allergy, allergic rhinitis on AIT. Her previous allergy office visit was on 08/27/2021 with Dr. [name]. Today is a regular follow up visit.  Asthma  Patient is feeling about 50% improved. Patient is still coughing throughout the day with minimal phlegm production - slightly yellow now.  No fevers/chills.  Currently on Trelegy 23mcg 1 puff once a day which seems to be helping more than prior inhalers. Takes Singulair 10mg  at night.  Still using albuterol nebulizer which helps.  Does not like to use cough medicine with codeine or oral prednisone.   Anaphylactic shock due to adverse food reaction Avoiding shellfish and sesame.   Seasonal and perennial allergic rhinitis Taking Claritin in the morning and allegra in the evening.  Assessment and Plan: Brittany Riggs is a 50 y.o. female with: Not well controlled severe persistent asthma Feeling 50% improved but now has some discolored phlegm. Denies any fevers/chills. Does not like to take oral prednisone or cough medicine with codeine. Coughing resolved after duoneb treatment in the office.  Start azithromycin.  For the next 7 days: Start budesonide nebulizer twice a day. If you need to use the albuterol nebulizer machine back to back for 2 times and your breathing is still not controlled then go to the ER! May take tessalon perles as needed for coughing. Letter written to work from home this week and next week. Daily controller  medication(s):  Continue Trelegy 231mcg 1 puff daily and rinse mouth after each use.  Continue Singulair 10mg  daily at night.  During upper respiratory infections: Start budesonide 0.5mg  nebulizer twice a day for 7 days. May use albuterol rescue inhaler 2 puffs or nebulizer every 4 to 6 hours as needed for shortness of breath, chest tightness, coughing, and wheezing. May use albuterol rescue inhaler 2 puffs 5 to 15 minutes prior to strenuous physical activities. Monitor frequency of use.  Get bloodwork to see if you qualify for biologics. Get spirometry at next visit.  Seasonal and perennial allergic rhinitis Past history - started AIT on 07/02/2016 (G/Weeds/T + M/DM/C/CR). Nasal sprays cause epistaxis. Patient has dry eyes.  Interim history - stable.  Continue allergy injections - restart when feeling better.  Continue environmental control measures.  May use over the counter antihistamines such as Zyrtec (cetirizine), Claritin (loratadine), Allegra (fexofenadine), or Xyzal (levocetirizine) daily as needed. May take twice a day during flares.  Continue Singulair 10mg  daily at night.  May use nasal saline spray (i.e., Simply Saline) as needed. Declines nasal sprays due to epistaxis issues.   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 2021 bloodwork borderline positive to shellfish. Continue to avoid shellfish and sesame. For mild symptoms you can take over the counter antihistamines such as Benadryl and monitor symptoms closely. If symptoms worsen or if you have severe symptoms including breathing issues, throat closure, significant swelling, whole body hives, severe diarrhea and vomiting, lightheadedness then inject epinephrine and seek immediate medical care  afterwards.  Return in about 6 weeks (around 10/22/2021).  Meds ordered this encounter  Medications   azithromycin (ZITHROMAX Z-PAK) 250 MG tablet    Sig: Take 2 tablets on day 1, then 1  tablet daily.    Dispense:  6 each    Refill:  0   benzonatate (TESSALON PERLES) 100 MG capsule    Sig: Take 2 capsules (200 mg total) by mouth 3 (three) times daily as needed for cough.    Dispense:  50 capsule    Refill:  0   budesonide (PULMICORT) 0.5 MG/2ML nebulizer solution    Sig: Take 2 mLs (0.5 mg total) by nebulization in the morning and at bedtime.    Dispense:  120 mL    Refill:  1    Lab Orders         CBC with Differential/Platelet         IgE      Diagnostics: None.   Medication List:  Current Outpatient Medications  Medication Sig Dispense Refill   albuterol (PROAIR HFA) 108 (90 Base) MCG/ACT inhaler Inhale 2 puffs into the lungs every 6 (six) hours as needed for wheezing or shortness of breath. 18 g 1   albuterol (PROVENTIL) (2.5 MG/3ML) 0.083% nebulizer solution Take 3 mLs (2.5 mg total) by nebulization every 4 (four) hours as needed for wheezing or shortness of breath (coughing fits). 150 mL 1   Ascorbic Acid (VITAMIN C) 1000 MG tablet 1 tablet     azithromycin (ZITHROMAX Z-PAK) 250 MG tablet Take 2 tablets on day 1, then 1 tablet daily. 6 each 0   Beclomethasone Dipropionate (QNASL) 80 MCG/ACT AERS 2 sprays     benzonatate (TESSALON PERLES) 100 MG capsule Take 2 capsules (200 mg total) by mouth 3 (three) times daily as needed for cough. 50 capsule 0   budesonide (PULMICORT) 0.5 MG/2ML nebulizer solution Take 2 mLs (0.5 mg total) by nebulization in the morning and at bedtime. 120 mL 1   carvedilol (COREG) 3.125 MG tablet Take 3.125 mg by mouth 2 (two) times daily with a meal.      cetirizine (ZYRTEC) 10 MG tablet Take 10 mg by mouth at bedtime as needed for allergies or rhinitis.     chlorthalidone (HYGROTON) 25 MG tablet 1 tablet in the morning with food     Cholecalciferol (VITAMIN D3) 5000 units TABS Take 5,000 Units by mouth daily.      EPINEPHrine (AUVI-Q) 0.3 mg/0.3 mL IJ SOAJ injection Inject 0.3 mg into the muscle as needed for anaphylaxis. 1 each 1    Eszopiclone 3 MG TABS Take 3 mg by mouth at bedtime as needed (sleep). Take immediately before bedtime     fexofenadine (ALLEGRA) 180 MG tablet 1 tablet     hydrocortisone (ANUSOL-HC) 2.5 % rectal cream 1 application to affected area     hydroxychloroquine (PLAQUENIL) 200 MG tablet Take 200 mg by mouth daily. 2 tabs daily     hydroxychloroquine (PLAQUENIL) 200 MG tablet See admin instructions.     irbesartan (AVAPRO) 300 MG tablet 1 tablet     JOLESSA 0.15-0.03 MG tablet Take 1 tablet by mouth daily.     levonorgestrel-ethinyl estradiol (SEASONALE) 0.15-0.03 MG tablet 1 tablet     Lifitegrast 5 % SOLN Place 1 drop into both eyes 2 (two) times daily.     loperamide (IMODIUM) 2 MG capsule Take 1 capsule (2 mg total) by mouth 4 (four) times daily as needed for diarrhea or loose stools.  20 capsule 0   loratadine (CLARITIN) 10 MG tablet Take 10 mg by mouth in the morning. Reported on 06/23/2016     metaxalone (SKELAXIN) 800 MG tablet 1 tablet     metroNIDAZOLE (METROCREAM) 0.75 % cream Apply 1 application topically See admin instructions.      montelukast (SINGULAIR) 10 MG tablet Take 1 tablet (10 mg total) by mouth at bedtime. 90 tablet 1   naratriptan (AMERGE) 2.5 MG tablet Take 2.5 mg by mouth as needed for migraine.   5   NURTEC 75 MG TBDP Take 1 tablet by mouth daily as needed.     omeprazole (PRILOSEC) 20 MG capsule 1 capsule     omeprazole (PRILOSEC) 40 MG capsule Take 1 capsule (40 mg total) by mouth daily. 90 capsule 2   ondansetron (ZOFRAN ODT) 4 MG disintegrating tablet Take 1 tablet (4 mg total) by mouth every 8 (eight) hours as needed for nausea or vomiting. 21 tablet 0   pantoprazole (PROTONIX) 40 MG tablet Take 40 mg by mouth daily.     Potassium Gluconate 2 MEQ TABS 1 tablet     predniSONE (DELTASONE) 5 MG tablet as directed for RA flare     promethazine-dextromethorphan (PROMETHAZINE-DM) 6.25-15 MG/5ML syrup 5 ml as needed     Respiratory Therapy Supplies (FLUTTER) DEVI Use as  directed 1 each 0   Rimegepant Sulfate (NURTEC) 75 MG TBDP      tiZANidine (ZANAFLEX) 2 MG tablet Take 1 tablet (2 mg total) by mouth at bedtime as needed for muscle spasms. 30 tablet 0   traMADol (ULTRAM) 50 MG tablet 1-2 every 4 hours as needed for cough or pain (Patient taking differently: Take 50-100 mg by mouth every 4 (four) hours as needed (pain). 1-2 every 4 hours as needed for cough or pain) 40 tablet 0   valACYclovir (VALTREX) 500 MG tablet Take 500 mg by mouth daily.     venlafaxine XR (EFFEXOR-XR) 150 MG 24 hr capsule Take 150 mg by mouth 2 (two) times daily with a meal.      Vitamin D, Ergocalciferol, 50000 units CAPS 1 capsule     No current facility-administered medications for this visit.   Allergies: Allergies  Allergen Reactions   Amoxicillin Hives   Codeine Itching   Penicillins Hives    Has patient had a PCN reaction causing immediate rash, facial/tongue/throat swelling, SOB or lightheadedness with hypotension: Yes Has patient had a PCN reaction causing severe rash involving mucus membranes or skin necrosis: no Has patient had a PCN reaction that required hospitalization: no Has patient had a PCN reaction occurring within the last 10 years: no If all of the above answers are "NO", then may proceed with Cephalosporin use.    Prednisone Other (See Comments)    Severe cramps and "spasms"   Sulfa Antibiotics Other (See Comments)    Does not remember side effects   Latex Rash and Other (See Comments)    "Sometimes bothers me"   I reviewed her past medical history, social history, family history, and environmental history and no significant changes have been reported from her previous visit.  Review of Systems  Constitutional:  Negative for appetite change, chills, fever and unexpected weight change.  HENT:  Positive for rhinorrhea and sneezing. Negative for congestion.   Eyes:  Negative for itching.  Respiratory:  Positive for cough and shortness of breath. Negative  for chest tightness and wheezing.   Gastrointestinal:  Negative for abdominal pain.  Skin:  Negative  for rash.  Allergic/Immunologic: Positive for environmental allergies.  Neurological:  Negative for headaches.   Objective: BP 134/80   Pulse (!) 132   Temp (!) 97.1 F (36.2 C) (Temporal)   Resp 16   Ht 5\' 8"  (1.727 m)   Wt (!) 358 lb (162.4 kg)   SpO2 100%   BMI 54.43 kg/m  Body mass index is 54.43 kg/m. Physical Exam Vitals and nursing note reviewed.  Constitutional:      Appearance: Normal appearance. She is well-developed.  HENT:     Head: Normocephalic and atraumatic.     Right Ear: Tympanic membrane and external ear normal.     Left Ear: Tympanic membrane and external ear normal.     Nose: Nose normal.     Mouth/Throat:     Mouth: Mucous membranes are moist.     Pharynx: Oropharynx is clear.  Eyes:     Conjunctiva/sclera: Conjunctivae normal.  Cardiovascular:     Rate and Rhythm: Normal rate and regular rhythm.     Heart sounds: Normal heart sounds. No murmur heard. Pulmonary:     Effort: Pulmonary effort is normal.     Breath sounds: Normal breath sounds. No wheezing, rhonchi or rales.     Comments: Coughing throughout Musculoskeletal:     Cervical back: Neck supple.  Skin:    General: Skin is warm.     Findings: No rash.  Neurological:     Mental Status: She is alert and oriented to person, place, and time.  Psychiatric:        Behavior: Behavior normal.   Previous notes and tests were reviewed. The plan was reviewed with the patient/family, and all questions/concerned were addressed.  It was my pleasure to see Brittany Riggs today and participate in her care. Please feel free to contact me with any questions or concerns.  Sincerely,  Rexene Alberts, DO Allergy & Immunology  [facility] office: [place] office: [phone]

## 2021-09-10 NOTE — Assessment & Plan Note (Signed)
Past history - started AIT on 07/02/2016 (G/Weeds/T + M/DM/C/CR). Nasal sprays cause epistaxis. Patient has dry eyes.  Interim history - stable.   Continue allergy injections - restart when feeling better.   Continue environmental control measures.   May use over the counter antihistamines such as Zyrtec (cetirizine), Claritin (loratadine), Allegra (fexofenadine), or Xyzal (levocetirizine) daily as needed. May take twice a day during flares.   Continue Singulair 10mg  daily at night.   May use nasal saline spray (i.e., Simply Saline) as needed.  Declines nasal sprays due to epistaxis issues.

## 2021-09-10 NOTE — Patient Instructions (Addendum)
Asthma:  For the next 7 days: Start budesonide nebulizer twice a day. If you need to use the neblizer machine back to back for 2 times and your breathing is still not controlled then go to the ER!  May take tessalon perles as needed for coughing. Letter written to work from home this week and next week.  Daily controller medication(s):  ContinueTrelegy 281mcg 1 puff daily and rinse mouth after each use.  Continue Singulair 10mg  daily at night.  During upper respiratory infections: Start budesonide nebulizer twice a day for 7 days. May use albuterol rescue inhaler 2 puffs or nebulizer every 4 to 6 hours as needed for shortness of breath, chest tightness, coughing, and wheezing. May use albuterol rescue inhaler 2 puffs 5 to 15 minutes prior to strenuous physical activities. Monitor frequency of use.  Asthma control goals:  Full participation in all desired activities (may need albuterol before activity) Albuterol use two times or less a week on average (not counting use with activity) Cough interfering with sleep two times or less a month Oral steroids no more than once a year No hospitalizations   Get bloodwork to see if you qualify for biologics.  We are ordering labs, so please allow 1-2 weeks for the results to come back. With the newly implemented Cures Act, the labs might be visible to you at the same time that they become visible to me. However, I will not address the results until all of the results are back, so please be patient.  In the meantime, continue recommendations in your patient instructions, including avoidance measures (if applicable), until you hear from me.  Seasonal and perennial allergic rhinitis Continue allergy injections - restart when feeling better.  Continue environmental control measures.  May use over the counter antihistamines such as Zyrtec (cetirizine), Claritin (loratadine), Allegra (fexofenadine), or Xyzal (levocetirizine) daily as needed. May take twice  a day during flares.  Continue Singulair 10mg  daily at night.  May use nasal saline spray (i.e., Simply Saline) as needed.   Food allergy Continue to avoid shellfish and sesame. For mild symptoms you can take over the counter antihistamines such as Benadryl and monitor symptoms closely. If symptoms worsen or if you have severe symptoms including breathing issues, throat closure, significant swelling, whole body hives, severe diarrhea and vomiting, lightheadedness then inject epinephrine and seek immediate medical care afterwards.  Follow up in 6 weeks or sooner if needed.

## 2021-09-11 NOTE — Telephone Encounter (Signed)
error 

## 2021-09-15 LAB — CBC WITH DIFFERENTIAL/PLATELET
Basophils Absolute: 0 10*3/uL (ref 0.0–0.2)
Basos: 0 %
EOS (ABSOLUTE): 0 10*3/uL (ref 0.0–0.4)
Eos: 0 %
Hematocrit: 43.6 % (ref 34.0–46.6)
Hemoglobin: 14.7 g/dL (ref 11.1–15.9)
Immature Grans (Abs): 0 10*3/uL (ref 0.0–0.1)
Immature Granulocytes: 0 %
Lymphocytes Absolute: 1.7 10*3/uL (ref 0.7–3.1)
Lymphs: 30 %
MCH: 33.3 pg — ABNORMAL HIGH (ref 26.6–33.0)
MCHC: 33.7 g/dL (ref 31.5–35.7)
MCV: 99 fL — ABNORMAL HIGH (ref 79–97)
Monocytes Absolute: 0.4 10*3/uL (ref 0.1–0.9)
Monocytes: 8 %
Neutrophils Absolute: 3.4 10*3/uL (ref 1.4–7.0)
Neutrophils: 62 %
Platelets: 358 10*3/uL (ref 150–450)
RBC: 4.42 x10E6/uL (ref 3.77–5.28)
RDW: 12.4 % (ref 11.7–15.4)
WBC: 5.6 10*3/uL (ref 3.4–10.8)

## 2021-09-15 LAB — IGE: IgE (Immunoglobulin E), Serum: 95 IU/mL (ref 6–495)

## 2021-09-18 IMAGING — CT CT CHEST HIGH RESOLUTION W/O CM
2 of 7 series · 14 of 36 positions shown, 17 images · non-contrast
Comparison: 10/04/2020 chest radiograph. 04/17/2016 high-resolution
chest CT.

CLINICAL DATA: NFFZ0-12 pneumonia in [REDACTED], with persistent
cough and dyspnea.

EXAM:
CT CHEST WITHOUT CONTRAST
TECHNIQUE: Multidetector CT imaging of the chest was performed following the
standard protocol without intravenous contrast. High resolution
imaging of the lungs, as well as inspiratory and expiratory imaging,
was performed.

[Series 4: high resolution · axial · 0.79mm/px · z∈[-322,-64]mm · 11 of 310 slices shown, 14 images]
[im 26/310  mediastinal]
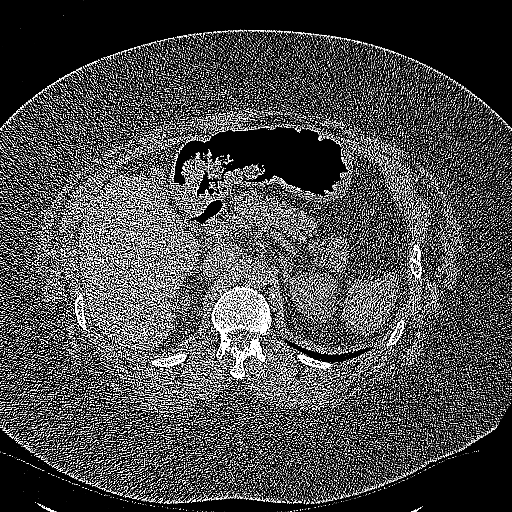
[im 26/310  lung]
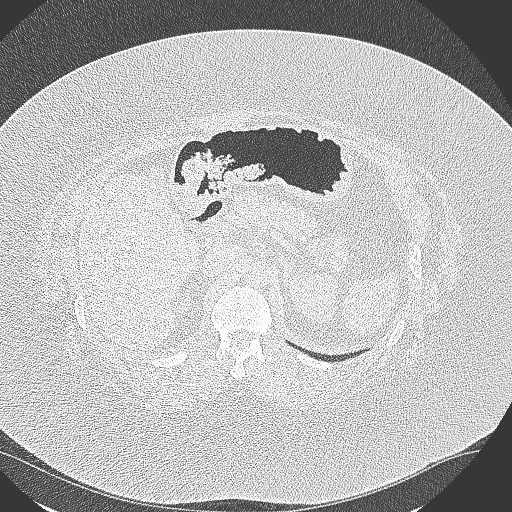
[im 52/310  lung]
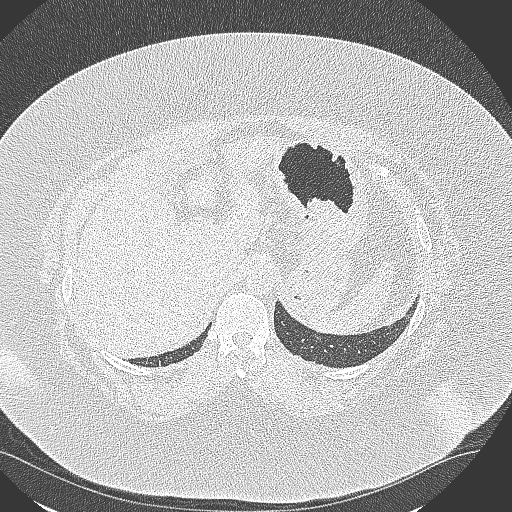
[im 78/310  lung]
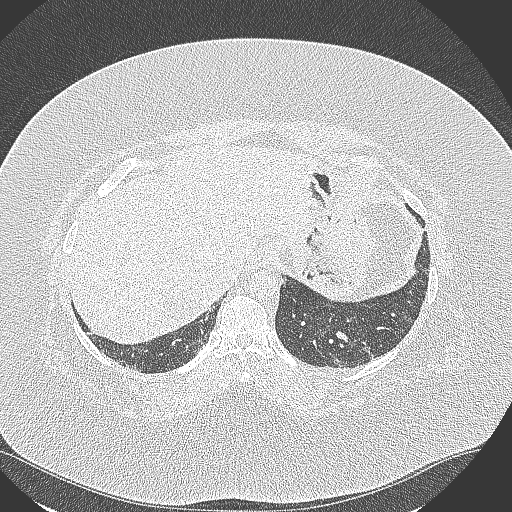
[im 104/310  lung]
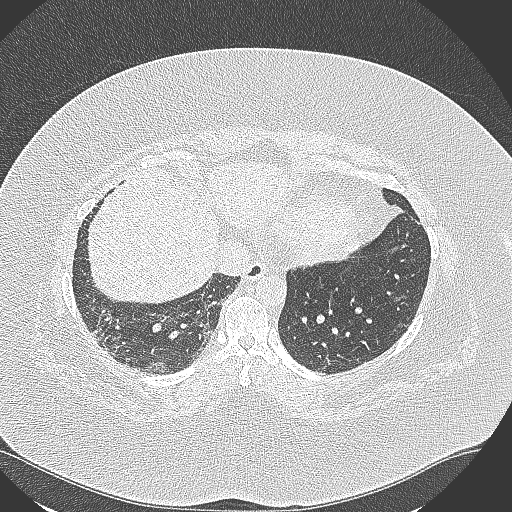
[im 129/310  mediastinal]
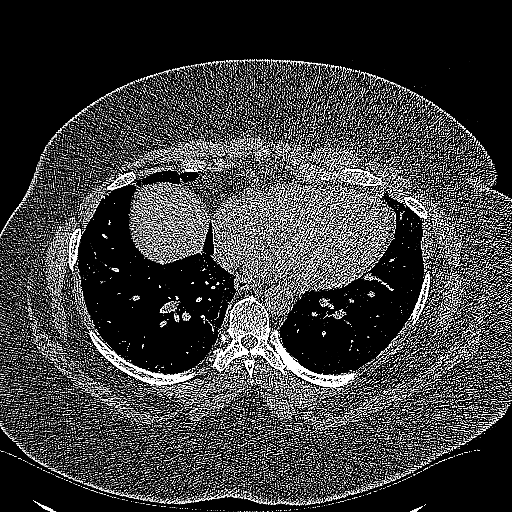
[im 129/310  lung]
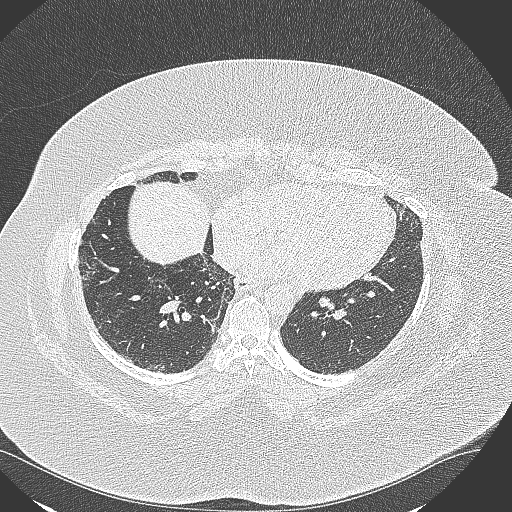
[im 155/310  lung]
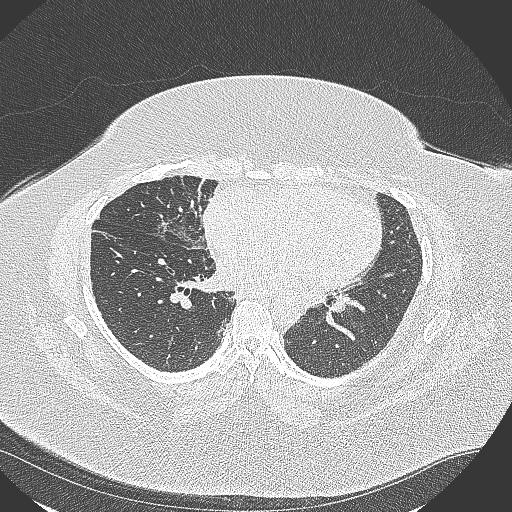
[im 181/310  lung]
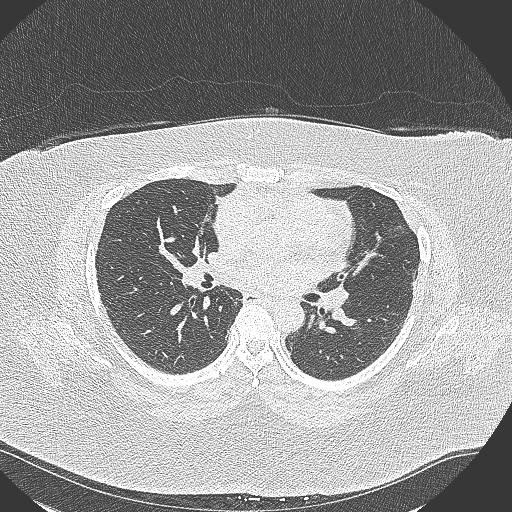
[im 207/310  lung]
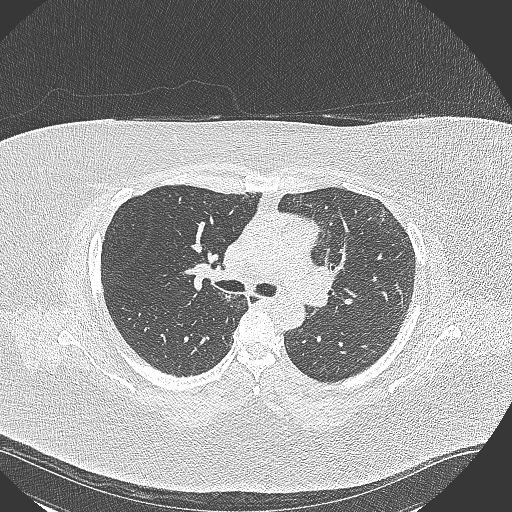
[im 232/310  mediastinal]
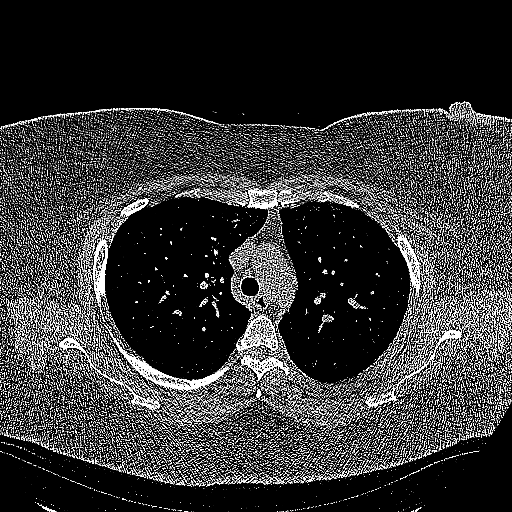
[im 232/310  lung]
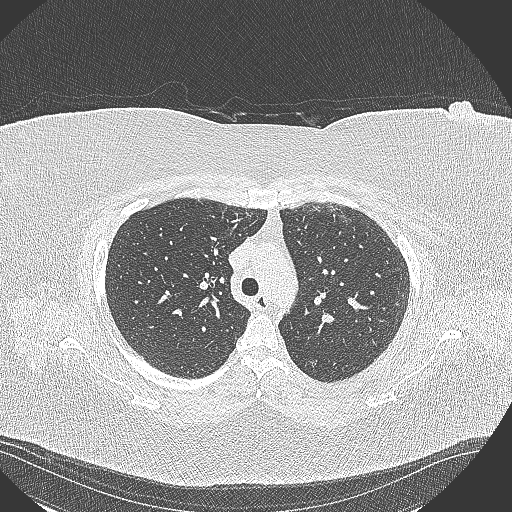
[im 258/310  lung]
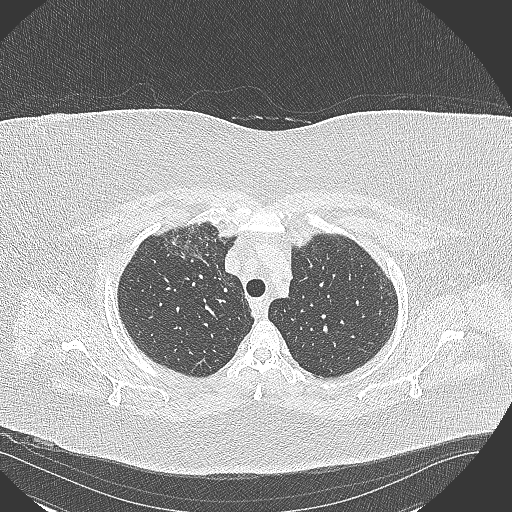
[im 284/310  lung]
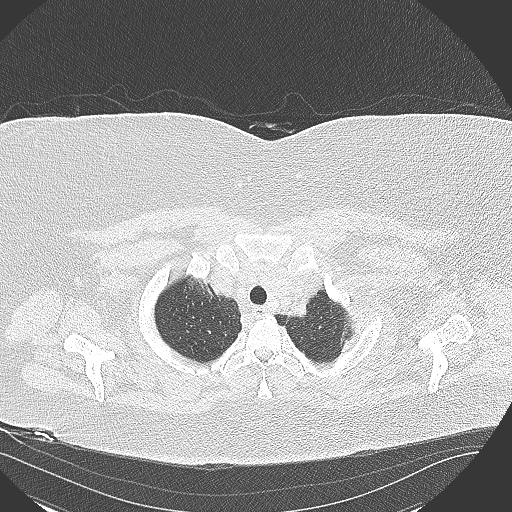

[Series 8: coronal · coronal · 0.59mm/px · 3 of 133 slices shown]
[im 27/133  lung]
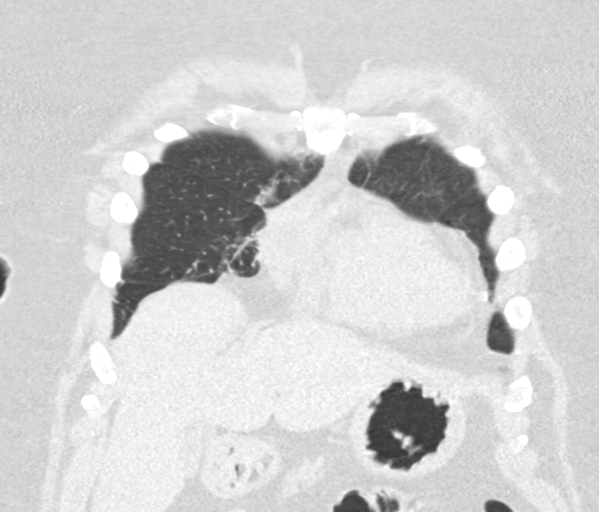
[im 53/133  lung]
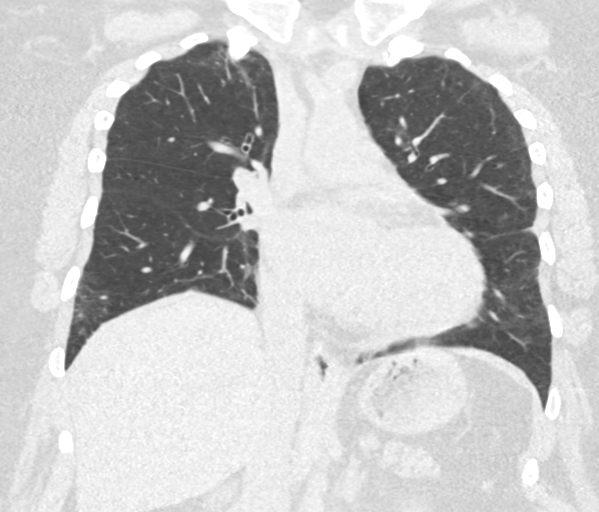
[im 80/133  lung]
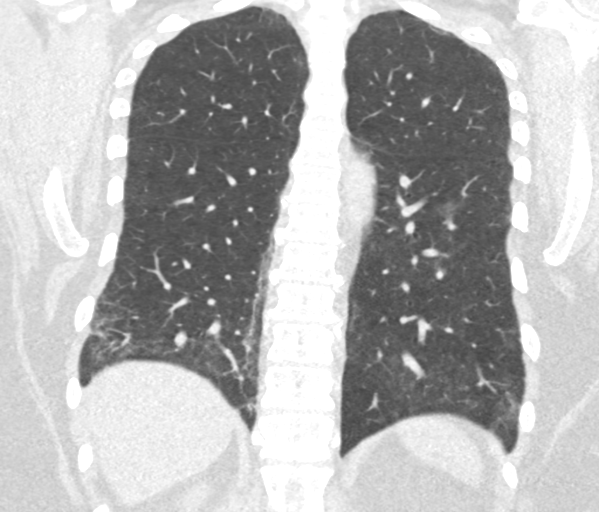

[14 of 36 positions shown; findings below may reference images not displayed]

FINDINGS: Cardiovascular: Normal heart size. No significant pericardial
effusion/thickening. Great vessels are normal in course and caliber.
Aberrant nonaneurysmal right subclavian artery arising from the
distal arch with retroesophageal course.

Mediastinum/Nodes: No discrete thyroid nodules. Unremarkable
esophagus. No pathologically enlarged axillary, mediastinal or hilar
lymph nodes, noting limited sensitivity for the detection of hilar
adenopathy on this noncontrast study.

Lungs/Pleura: No pneumothorax. No pleural effusion. No acute
consolidative airspace disease, lung masses or significant pulmonary
nodules. Moderate patchy peripheral ground-glass opacity and
reticulation throughout both lungs with associated mild traction
bronchiectasis and mild architectural distortion. No clear
apicobasilar gradient to these findings, which are new since
04/17/2016 chest CT. No frank honeycombing. No significant air
trapping or definite tracheobronchomalacia on the expiration
sequence.

Upper abdomen: Simple 3.6 cm posterior right liver cyst.

Musculoskeletal: No aggressive appearing focal osseous lesions. Mild
thoracic spondylosis.
IMPRESSION: 1. Moderate patchy peripheral ground-glass opacity and reticulation
throughout both lungs with associated mild traction bronchiectasis
and mild architectural distortion. Findings are new since 04/17/2016
chest CT. Findings are compatible with evolving postinflammatory
fibrosis given history of reported recent NFFZ0-12 infection.
Suggest follow-up high-resolution chest CT in 6-12 months to assess
temporal pattern stability.
2. Aberrant right subclavian artery.

## 2021-09-25 ENCOUNTER — Ambulatory Visit (INDEPENDENT_AMBULATORY_CARE_PROVIDER_SITE_OTHER): Payer: 59 | Admitting: *Deleted

## 2021-09-25 DIAGNOSIS — J309 Allergic rhinitis, unspecified: Secondary | ICD-10-CM | POA: Diagnosis not present

## 2021-10-02 ENCOUNTER — Ambulatory Visit (INDEPENDENT_AMBULATORY_CARE_PROVIDER_SITE_OTHER): Payer: 59 | Admitting: *Deleted

## 2021-10-02 DIAGNOSIS — J309 Allergic rhinitis, unspecified: Secondary | ICD-10-CM

## 2021-10-03 ENCOUNTER — Encounter: Payer: Self-pay | Admitting: *Deleted

## 2021-10-09 ENCOUNTER — Ambulatory Visit (INDEPENDENT_AMBULATORY_CARE_PROVIDER_SITE_OTHER): Payer: 59 | Admitting: *Deleted

## 2021-10-09 DIAGNOSIS — J309 Allergic rhinitis, unspecified: Secondary | ICD-10-CM | POA: Diagnosis not present

## 2021-10-13 ENCOUNTER — Encounter: Payer: Self-pay | Admitting: Allergy

## 2021-10-13 ENCOUNTER — Other Ambulatory Visit: Payer: Self-pay | Admitting: *Deleted

## 2021-10-13 ENCOUNTER — Ambulatory Visit (INDEPENDENT_AMBULATORY_CARE_PROVIDER_SITE_OTHER): Payer: 59

## 2021-10-13 DIAGNOSIS — J309 Allergic rhinitis, unspecified: Secondary | ICD-10-CM

## 2021-10-13 MED ORDER — TRELEGY ELLIPTA 200-62.5-25 MCG/ACT IN AEPB: 1.0000 | INHALATION_SPRAY | Freq: Every day | RESPIRATORY_TRACT | 5 refills | Status: DC

## 2021-10-17 NOTE — Patient Instructions (Incomplete)
Severe persistent sthma Daily controller medication(s):  ContinueTrelegy 267mcg 1 puff daily and rinse mouth after each use.  Continue Singulair 10mg  daily at night.  During upper respiratory infections: Start budesonide nebulizer twice a day for 7 days. May use albuterol rescue inhaler 2 puffs or nebulizer every 4 to 6 hours as needed for shortness of breath, chest tightness, coughing, and wheezing. May use albuterol rescue inhaler 2 puffs 5 to 15 minutes prior to strenuous physical activities. Monitor frequency of use.  Asthma control goals:  Full participation in all desired activities (may need albuterol before activity) Albuterol use two times or less a week on average (not counting use with activity) Cough interfering with sleep two times or less a month Oral steroids no more than once a year No hospitalizations.  Seasonal and perennial allergic rhinitis Continue allergy injections  May use over the counter antihistamines such as Zyrtec (cetirizine), Claritin (loratadine), Allegra (fexofenadine), or Xyzal (levocetirizine) daily as needed. May take twice a day during flares.  Continue Singulair 10mg  daily at night.  May use nasal saline spray (i.e., Simply Saline) as needed.   Food allergy Continue to avoid shellfish and sesame. For mild symptoms you can take over the counter antihistamines such as Benadryl and monitor symptoms closely. If symptoms worsen or if you have severe symptoms including breathing issues, throat closure, significant swelling, whole body hives, severe diarrhea and vomiting, lightheadedness then inject epinephrine and seek immediate medical care afterwards.  Follow up in months or sooner if needed.

## 2021-10-20 ENCOUNTER — Ambulatory Visit: Payer: 59 | Admitting: Family

## 2021-10-20 NOTE — Patient Instructions (Addendum)
Severe persistent asthma Continue to consider Xolair injections. Information to be picked up from [facility] office about Xolair for asthma.  After speaking with Dr. [name] she would like the first 3 injections of Xolair to be by themselves and not with allergy injections.  She would like for you to schedule a follow-up appointment with her after the second Xolair injection if you are interested in starting and then it can be decided about doing Xolair and allergy injections together. Daily controller medication(s):  ContinueTrelegy 237mcg 1 puff daily and rinse mouth after each use.  Stop Singulair 10mg  daily at night since it makes you drowsy. During upper respiratory infections: Start budesonide nebulizer twice a day for 7 days. May use albuterol rescue inhaler 2 puffs or nebulizer every 4 to 6 hours as needed for shortness of breath, chest tightness, coughing, and wheezing. May use albuterol rescue inhaler 2 puffs 5 to 15 minutes prior to strenuous physical activities. Monitor frequency of use.  Asthma control goals:  Full participation in all desired activities (may need albuterol before activity) Albuterol use two times or less a week on average (not counting use with activity) Cough interfering with sleep two times or less a month Oral steroids no more than once a year No hospitalizations.  Seasonal and perennial allergic rhinitis Continue allergy injections, but hold off on getting your allergy injection this week. May use over the counter antihistamines such as Zyrtec (cetirizine), Claritin (loratadine), Allegra (fexofenadine), or Xyzal (levocetirizine) daily as needed. May take twice a day during flares.  Stop Singulair 10mg  daily at night.  May use nasal saline spray (i.e., Simply Saline) as needed.   Food allergy Continue to avoid shellfish and sesame. For mild symptoms you can take over the counter antihistamines such as Benadryl and monitor symptoms closely. If symptoms worsen or if  you have severe symptoms including breathing issues, throat closure, significant swelling, whole body hives, severe diarrhea and vomiting, lightheadedness then inject epinephrine and seek immediate medical care afterwards.  Acute sinus infection - Start doxycycline 100 mg 1 tablet once a day for 7 days  Follow up in 4-6 weeks or sooner if needed with spirometry

## 2021-10-21 ENCOUNTER — Ambulatory Visit: Payer: Self-pay | Admitting: *Deleted

## 2021-10-21 ENCOUNTER — Encounter: Payer: Self-pay | Admitting: Family

## 2021-10-21 ENCOUNTER — Ambulatory Visit (INDEPENDENT_AMBULATORY_CARE_PROVIDER_SITE_OTHER): Payer: 59 | Admitting: Family

## 2021-10-21 ENCOUNTER — Telehealth: Payer: Self-pay | Admitting: Family

## 2021-10-21 ENCOUNTER — Other Ambulatory Visit: Payer: Self-pay

## 2021-10-21 DIAGNOSIS — J019 Acute sinusitis, unspecified: Secondary | ICD-10-CM | POA: Diagnosis not present

## 2021-10-21 DIAGNOSIS — J309 Allergic rhinitis, unspecified: Secondary | ICD-10-CM

## 2021-10-21 DIAGNOSIS — T7800XD Anaphylactic reaction due to unspecified food, subsequent encounter: Secondary | ICD-10-CM

## 2021-10-21 DIAGNOSIS — J4550 Severe persistent asthma, uncomplicated: Secondary | ICD-10-CM

## 2021-10-21 DIAGNOSIS — J3089 Other allergic rhinitis: Secondary | ICD-10-CM | POA: Diagnosis not present

## 2021-10-21 DIAGNOSIS — J302 Other seasonal allergic rhinitis: Secondary | ICD-10-CM

## 2021-10-21 MED ORDER — DOXYCYCLINE MONOHYDRATE 100 MG PO TABS: 100.0000 mg | ORAL_TABLET | Freq: Two times a day (BID) | ORAL | 0 refills | Status: DC

## 2021-10-21 NOTE — Telephone Encounter (Signed)
Attempted to contact [place] again. Please ask if she feels like she needs Depo 40 mg injection for her asthma. Also, please let her know that I recommend she hold off on getting her allergy injection this week.   Also, please let her know that if she decides to start Xolair injections, that Dr. [name] recommends she get the first 3 Xolair injections alone and not on the days she gets allergy injections. She will need to schedule a follow up appointment with Dr. [name] after her second Xolair injection to discuss/decide about doing Xolair and allergy injections together.  Thank you, [name], FNP

## 2021-10-21 NOTE — Progress Notes (Signed)
RE: Brittany Riggs MRN: [id number] DOB: 09/21/1971 Date of Telemedicine Visit: 10/21/2021  Referring provider: [name], MD Primary care provider: [name], MD  Chief Complaint: Follow-up   Telemedicine Follow Up Visit via Telephone: I connected with Brittany Riggs for a follow up on 10/21/21 by telephone and verified that I am speaking with the correct person using two identifiers.   I discussed the limitations, risks, security and privacy concerns of performing an evaluation and management service by telephone and the availability of in person appointments. I also discussed with the patient that there may be a patient responsible charge related to this service. The patient expressed understanding and agreed to proceed.  Patient is at work  Provider is at the office.  Visit start time: 10:41 AM Visit end time: 11:23 AM Insurance consent/check in by: Vance Gather Medical consent and medical assistant/nurse: Vallarie Mare  History of Present Illness: She is a 50 y.o. female, who is being followed for not well controlled severe persistent asthma, seasonal and perennial allergic rhinitis, and anaphylactic shock due to food.Her previous allergy office visit was on 09/10/21 with Dr. [name].    Not well controlled severe persistent asthma is reported as moderately controlled with Trelegy 200 mcg 1 puff once a day, albuterol as needed, and budesonide 0.5 mg twice a day for 7 days with asthma flares. She is approximately 65% better. She she reports that she told one of our staff that she wanted  to hold off on starting Xolair until she saw how her other medications were working.  She also reports that she has a hard time getting to our office due to her work schedule.  She wonders if she can get her Xolair injections on the same day that she gets her allergy injections.  She reports a productive cough in the morning that is clear with a little bit of yellow sputum.  Her cough is then dry  the rest of the day.  She also reports tightness in her chest and shortness of breath.  She denies fever, wheezing and nocturnal awakenings due to breathing problems.  Since her last office visit she has not required any systemic steroids or made any trips to the emergency room or urgent care due to breathing problems.  She uses her albuterol inhaler approximately 2-3 times a week and this occurs usually in the morning.  She reports that she is not able to tolerate oral steroids, but can tolerate steroid injections better.  She has used her budesonide 0.5 mg twice a day for asthma flares on 2 separate days since her last office visit.  Seasonal and perennial allergic rhinitis is reported as moderately controlled with allergy injections per protocol, Claritin 10 mg in the morning and at times Allegra at night.  She has been cutting her Singulair and half due to it making her really drowsy in the morning.  She does state that this does make her feel less drowsy.  She is not certain if Singulair helps with her symptoms.  She does mention that her large local reactions at her injection sites have been doing better since adding the epi wash.  She reports that she still will have some redness and a raised area the size of a penny.  Before starting the epi wash the areas where size of a quarter.  She does feel like her allergy injections were helping before she started having her problems with her asthma.  She reports postnasal drip and itchy eyes.  She denies fever, body aches, rhinorrhea and nasal congestion.  Her postnasal drip is clear with a little yellow and this has been going on for 3 to 4 weeks.  She reports that she thinks that she can tolerate doxycycline.  She continues to avoid shellfish and sesame without any accidental ingestion or use of her EpiPen.  She reports that her EpiPen is up-to-date.  She also mentions that she avoids tomato sauce because if she eats something with tomato sauce it is makes her  itch.  She denies any concomitant cardiorespiratory and gastrointestinal symptoms.  Assessment and Plan: Deshaun is a 50 y.o. female with: Patient Instructions  Severe persistent asthma Continue to consider Xolair injections. Information to be picked up from [facility] office about Xolair for asthma.  After speaking with Dr. [name] she would like the first 3 injections of Xolair to be by themselves and not with allergy injections.  She would like for you to schedule a follow-up appointment with her after the second Xolair injection if you are interested in starting and then it can be decided about doing Xolair and allergy injections together. Daily controller medication(s):  ContinueTrelegy 237mcg 1 puff daily and rinse mouth after each use.  Stop Singulair 10mg  daily at night since it makes you drowsy. During upper respiratory infections: Start budesonide nebulizer twice a day for 7 days. May use albuterol rescue inhaler 2 puffs or nebulizer every 4 to 6 hours as needed for shortness of breath, chest tightness, coughing, and wheezing. May use albuterol rescue inhaler 2 puffs 5 to 15 minutes prior to strenuous physical activities. Monitor frequency of use.  Asthma control goals:  Full participation in all desired activities (may need albuterol before activity) Albuterol use two times or less a week on average (not counting use with activity) Cough interfering with sleep two times or less a month Oral steroids no more than once a year No hospitalizations.  Seasonal and perennial allergic rhinitis Continue allergy injections, but hold off on getting your allergy injection this week. May use over the counter antihistamines such as Zyrtec (cetirizine), Claritin (loratadine), Allegra (fexofenadine), or Xyzal (levocetirizine) daily as needed. May take twice a day during flares.  Stop Singulair 10mg  daily at night.  May use nasal saline spray (i.e., Simply Saline) as needed.   Food allergy Continue to  avoid shellfish and sesame. For mild symptoms you can take over the counter antihistamines such as Benadryl and monitor symptoms closely. If symptoms worsen or if you have severe symptoms including breathing issues, throat closure, significant swelling, whole body hives, severe diarrhea and vomiting, lightheadedness then inject epinephrine and seek immediate medical care afterwards.  Acute sinus infection - Start doxycycline 100 mg 1 tablet once a day for 7 days  Follow up in 4-6 weeks or sooner if needed with spirometry  Return in about 4 weeks (around 11/18/2021), or if symptoms worsen or fail to improve.  Meds ordered this encounter  Medications   doxycycline (ADOXA) 100 MG tablet    Sig: Take 1 tablet (100 mg total) by mouth 2 (two) times daily.    Dispense:  14 tablet    Refill:  0    Lab Orders  No laboratory test(s) ordered today    Diagnostics: None.  Medication List:  Current Outpatient Medications  Medication Sig Dispense Refill   albuterol (PROAIR HFA) 108 (90 Base) MCG/ACT inhaler Inhale 2 puffs into the lungs every 6 (six) hours as needed for wheezing or shortness of breath. 18 g  1   albuterol (PROVENTIL) (2.5 MG/3ML) 0.083% nebulizer solution Take 3 mLs (2.5 mg total) by nebulization every 4 (four) hours as needed for wheezing or shortness of breath (coughing fits). 150 mL 1   Ascorbic Acid (VITAMIN C) 1000 MG tablet 1 tablet     Beclomethasone Dipropionate (QNASL) 80 MCG/ACT AERS 2 sprays     benzonatate (TESSALON PERLES) 100 MG capsule Take 2 capsules (200 mg total) by mouth 3 (three) times daily as needed for cough. 50 capsule 0   budesonide (PULMICORT) 0.5 MG/2ML nebulizer solution Take 2 mLs (0.5 mg total) by nebulization in the morning and at bedtime. 120 mL 1   carvedilol (COREG) 3.125 MG tablet Take 3.125 mg by mouth 2 (two) times daily with a meal.      chlorthalidone (HYGROTON) 25 MG tablet 1 tablet in the morning with food     Cholecalciferol (VITAMIN D3)  5000 units TABS Take 5,000 Units by mouth daily.      doxycycline (ADOXA) 100 MG tablet Take 1 tablet (100 mg total) by mouth 2 (two) times daily. 14 tablet 0   EPINEPHrine (AUVI-Q) 0.3 mg/0.3 mL IJ SOAJ injection Inject 0.3 mg into the muscle as needed for anaphylaxis. 1 each 1   Eszopiclone 3 MG TABS Take 3 mg by mouth at bedtime as needed (sleep). Take immediately before bedtime     fexofenadine (ALLEGRA) 180 MG tablet 1 tablet     hydrocortisone (ANUSOL-HC) 2.5 % rectal cream 1 application to affected area     hydroxychloroquine (PLAQUENIL) 200 MG tablet Take 200 mg by mouth daily. 2 tabs daily     hydroxychloroquine (PLAQUENIL) 200 MG tablet See admin instructions.     irbesartan (AVAPRO) 300 MG tablet 1 tablet     JOLESSA 0.15-0.03 MG tablet Take 1 tablet by mouth daily.     levonorgestrel-ethinyl estradiol (SEASONALE) 0.15-0.03 MG tablet 1 tablet     Lifitegrast 5 % SOLN Place 1 drop into both eyes 2 (two) times daily.     loperamide (IMODIUM) 2 MG capsule Take 1 capsule (2 mg total) by mouth 4 (four) times daily as needed for diarrhea or loose stools. 20 capsule 0   loratadine (CLARITIN) 10 MG tablet Take 10 mg by mouth in the morning. Reported on 06/23/2016     montelukast (SINGULAIR) 10 MG tablet Take 1 tablet (10 mg total) by mouth at bedtime. 90 tablet 1   naratriptan (AMERGE) 2.5 MG tablet Take 2.5 mg by mouth as needed for migraine.   5   NURTEC 75 MG TBDP Take 1 tablet by mouth daily as needed.     omeprazole (PRILOSEC) 40 MG capsule Take 1 capsule (40 mg total) by mouth daily. 90 capsule 2   ondansetron (ZOFRAN ODT) 4 MG disintegrating tablet Take 1 tablet (4 mg total) by mouth every 8 (eight) hours as needed for nausea or vomiting. 21 tablet 0   pantoprazole (PROTONIX) 40 MG tablet Take 40 mg by mouth daily.     Potassium Gluconate 2 MEQ TABS 1 tablet     predniSONE (DELTASONE) 5 MG tablet as directed for RA flare     promethazine-dextromethorphan (PROMETHAZINE-DM) 6.25-15  MG/5ML syrup 5 ml as needed     Respiratory Therapy Supplies (FLUTTER) DEVI Use as directed 1 each 0   Rimegepant Sulfate (NURTEC) 75 MG TBDP      tiZANidine (ZANAFLEX) 2 MG tablet Take 1 tablet (2 mg total) by mouth at bedtime as needed for muscle spasms. [place]  tablet 0   traMADol (ULTRAM) 50 MG tablet 1-2 every 4 hours as needed for cough or pain (Patient taking differently: Take 50-100 mg by mouth every 4 (four) hours as needed (pain). 1-2 every 4 hours as needed for cough or pain) 40 tablet 0   TRELEGY ELLIPTA 200-62.5-25 MCG/ACT AEPB Inhale 1 puff into the lungs daily. 28 each 5   valACYclovir (VALTREX) 500 MG tablet Take 500 mg by mouth daily.     venlafaxine XR (EFFEXOR-XR) 150 MG 24 hr capsule Take 150 mg by mouth 2 (two) times daily with a meal.      Vitamin D, Ergocalciferol, 50000 units CAPS 1 capsule     cetirizine (ZYRTEC) 10 MG tablet Take 10 mg by mouth at bedtime as needed for allergies or rhinitis. (Patient not taking: Reported on 10/21/2021)     metaxalone (SKELAXIN) 800 MG tablet 1 tablet     metroNIDAZOLE (METROCREAM) 0.75 % cream Apply 1 application topically See admin instructions.      omeprazole (PRILOSEC) 20 MG capsule 1 capsule (Patient not taking: Reported on 10/21/2021)     No current facility-administered medications for this visit.   Allergies: Allergies  Allergen Reactions   Amoxicillin Hives   Codeine Itching   Penicillins Hives    Has patient had a PCN reaction causing immediate rash, facial/tongue/throat swelling, SOB or lightheadedness with hypotension: Yes Has patient had a PCN reaction causing severe rash involving mucus membranes or skin necrosis: no Has patient had a PCN reaction that required hospitalization: no Has patient had a PCN reaction occurring within the last 10 years: no If all of the above answers are "NO", then may proceed with Cephalosporin use.    Prednisone Other (See Comments)    Severe cramps and "spasms"   Sulfa Antibiotics Other (See  Comments)    Does not remember side effects   Latex Rash and Other (See Comments)    "Sometimes bothers me"   I reviewed her past medical history, social history, family history, and environmental history and no significant changes have been reported from previous visit on 09/10/21.  Review of Systems  Constitutional:  Positive for chills. Negative for fever.  HENT:  Positive for postnasal drip. Negative for congestion and rhinorrhea.        Reports postnasal drip that is clear with a little yellow.  Denies rhinorrhea and nasal congestion  Eyes:        Reports itchy watery eyes at times  Respiratory:  Positive for cough, chest tightness and shortness of breath. Negative for wheezing.        Reports a cough that is productive in the morning with clear to light yellow sputum and dry the rest of the day.  Also reports tightness in chest and shortness of breath.  Denies wheezing and nocturnal awakenings due to breathing problems.  Musculoskeletal:        Denies body aches  Allergic/Immunologic: Positive for environmental allergies and food allergies.   Objective: Physical Exam Not obtained as encounter was done via telephone.   Previous notes and tests were reviewed.  I discussed the assessment and treatment plan with the patient. The patient was provided an opportunity to ask questions and all were answered. The patient agreed with the plan and demonstrated an understanding of the instructions.   The patient was advised to call back or seek an in-person evaluation if the symptoms worsen or if the condition fails to improve as anticipated.  I provided 42 minutes of  non-face-to-face time during this encounter.  It was my pleasure to participate in [facility] care today. Please feel free to contact me with any questions or concerns.   Sincerely,  [name], FNP

## 2021-10-21 NOTE — Telephone Encounter (Signed)
Left a message on Jamika's cell phone to see if she felt like she needed a depo medrol 40 mg injection to help with her asthma.  [name], FNP

## 2021-10-21 NOTE — Telephone Encounter (Signed)
I didn't see this message until now. Patient came in today and received her allergy injections. I did give her a handout for Xolair for her Asthma.

## 2021-10-22 NOTE — Telephone Encounter (Signed)
Ok.Please update the patient on my previous message.

## 2021-10-22 NOTE — Telephone Encounter (Signed)
Left message for patient to call the office to inform her of Chrissie's message.

## 2021-11-21 ENCOUNTER — Ambulatory Visit (INDEPENDENT_AMBULATORY_CARE_PROVIDER_SITE_OTHER): Payer: 59

## 2021-11-21 DIAGNOSIS — J309 Allergic rhinitis, unspecified: Secondary | ICD-10-CM | POA: Diagnosis not present

## 2021-11-28 ENCOUNTER — Ambulatory Visit (INDEPENDENT_AMBULATORY_CARE_PROVIDER_SITE_OTHER): Payer: 59

## 2021-11-28 DIAGNOSIS — J309 Allergic rhinitis, unspecified: Secondary | ICD-10-CM

## 2021-12-03 ENCOUNTER — Ambulatory Visit (INDEPENDENT_AMBULATORY_CARE_PROVIDER_SITE_OTHER): Payer: 59

## 2021-12-03 DIAGNOSIS — J309 Allergic rhinitis, unspecified: Secondary | ICD-10-CM

## 2021-12-09 NOTE — Patient Instructions (Incomplete)
Severe persistent asthma (Singulair causes drowsiness) Continue to consider Xolair injections.  After speaking with Dr. [name] she would like the first 3 injections of Xolair to be by themselves and not with allergy injections.  She would like for you to schedule a follow-up appointment with her after the second Xolair injection if you are interested in starting and then it can be decided about doing Xolair and allergy injections together. Daily controller medication(s):  ContinueTrelegy 241mcg 1 puff daily and rinse mouth after each use.  During upper respiratory infections: Start budesonide nebulizer twice a day for 7 days. May use albuterol rescue inhaler 2 puffs or nebulizer every 4 to 6 hours as needed for shortness of breath, chest tightness, coughing, and wheezing. May use albuterol rescue inhaler 2 puffs 5 to 15 minutes prior to strenuous physical activities. Monitor frequency of use.  Asthma control goals:  Full participation in all desired activities (may need albuterol before activity) Albuterol use two times or less a week on average (not counting use with activity) Cough interfering with sleep two times or less a month Oral steroids no more than once a year No hospitalizations.  Seasonal and perennial allergic rhinitis Continue allergy injections, but hold off on getting your allergy injection this week. May use over the counter antihistamines such as Zyrtec (cetirizine), Claritin (loratadine), Allegra (fexofenadine), or Xyzal (levocetirizine) daily as needed. May take twice a day during flares.  May use nasal saline spray (i.e., Simply Saline) as needed.   Food allergy Continue to avoid shellfish and sesame. For mild symptoms you can take over the counter antihistamines such as Benadryl and monitor symptoms closely. If symptoms worsen or if you have severe symptoms including breathing issues, throat closure, significant swelling, whole body hives, severe diarrhea and vomiting,  lightheadedness then inject epinephrine and seek immediate medical care afterwards.   Follow up in weeks or sooner if needed with spirometry

## 2021-12-10 ENCOUNTER — Ambulatory Visit: Payer: 59 | Admitting: Family

## 2021-12-16 ENCOUNTER — Ambulatory Visit (INDEPENDENT_AMBULATORY_CARE_PROVIDER_SITE_OTHER): Payer: 59 | Admitting: *Deleted

## 2021-12-16 DIAGNOSIS — J309 Allergic rhinitis, unspecified: Secondary | ICD-10-CM | POA: Diagnosis not present

## 2021-12-25 ENCOUNTER — Ambulatory Visit (INDEPENDENT_AMBULATORY_CARE_PROVIDER_SITE_OTHER): Payer: 59

## 2021-12-25 DIAGNOSIS — J309 Allergic rhinitis, unspecified: Secondary | ICD-10-CM

## 2022-01-12 ENCOUNTER — Other Ambulatory Visit: Payer: Self-pay | Admitting: Allergy

## 2022-01-22 ENCOUNTER — Ambulatory Visit (INDEPENDENT_AMBULATORY_CARE_PROVIDER_SITE_OTHER): Payer: 59

## 2022-01-22 DIAGNOSIS — J309 Allergic rhinitis, unspecified: Secondary | ICD-10-CM

## 2022-02-10 ENCOUNTER — Telehealth: Payer: Self-pay | Admitting: Allergy

## 2022-02-10 NOTE — Telephone Encounter (Signed)
OF NOTE:  She has a televisit scheduled on my schedule for tomorrow 02/11/2022.

## 2022-02-10 NOTE — Telephone Encounter (Signed)
Please call patient.  Can she reschedule to a time when she can come in person for the visit?  She had not well controlled asthma. I need to listen to her lungs and do a spirometry.

## 2022-02-10 NOTE — Telephone Encounter (Signed)
Patient has an appointment with Dr. [name] on 02/11/22

## 2022-02-10 NOTE — Telephone Encounter (Signed)
Lm for pt to call us back about changing appt to in person

## 2022-02-11 ENCOUNTER — Ambulatory Visit: Payer: Self-pay

## 2022-02-11 ENCOUNTER — Ambulatory Visit (INDEPENDENT_AMBULATORY_CARE_PROVIDER_SITE_OTHER): Payer: 59 | Admitting: Allergy

## 2022-02-11 ENCOUNTER — Other Ambulatory Visit: Payer: Self-pay

## 2022-02-11 ENCOUNTER — Encounter: Payer: Self-pay | Admitting: Allergy

## 2022-02-11 DIAGNOSIS — T7800XD Anaphylactic reaction due to unspecified food, subsequent encounter: Secondary | ICD-10-CM

## 2022-02-11 DIAGNOSIS — J3089 Other allergic rhinitis: Secondary | ICD-10-CM

## 2022-02-11 DIAGNOSIS — J302 Other seasonal allergic rhinitis: Secondary | ICD-10-CM

## 2022-02-11 DIAGNOSIS — J4550 Severe persistent asthma, uncomplicated: Secondary | ICD-10-CM | POA: Diagnosis not present

## 2022-02-11 MED ORDER — TRELEGY ELLIPTA 200-62.5-25 MCG/ACT IN AEPB: 1.0000 | INHALATION_SPRAY | Freq: Every day | RESPIRATORY_TRACT | 5 refills | Status: DC

## 2022-02-11 MED ORDER — ALBUTEROL SULFATE HFA 108 (90 BASE) MCG/ACT IN AERS: 2.0000 | INHALATION_SPRAY | RESPIRATORY_TRACT | 1 refills | Status: DC | PRN

## 2022-02-11 NOTE — Assessment & Plan Note (Signed)
Past history -  Sesame causes itching. 2021 bloodwork borderline positive to shellfish. ?? Continue to avoid shellfish and sesame. ?? For mild symptoms you can take over the counter antihistamines such as Benadryl and monitor symptoms closely. If symptoms worsen or if you have severe symptoms including breathing issues, throat closure, significant swelling, whole body hives, severe diarrhea and vomiting, lightheadedness then inject epinephrine and seek immediate medical care afterwards. ?

## 2022-02-11 NOTE — Assessment & Plan Note (Addendum)
Doing better with Trelegy but the weather change is causing some shortness of breath. Denies any additional prednisone since the last visit. Interested in biologics if able to self-administer at home. ?? Daily controller medication(s):  ?o Continue Trelegy 215mcg 1 puff daily and rinse mouth after each use.  ?o Continue Singulair (montelukast) 10mg  daily at night. ?? During upper respiratory infections/asthma flares:  ?o Start Pulmicort 0.5mg  nebulizer twice a day for 1-2 weeks until your breathing symptoms return to baseline.  ?o Pretreat with albuterol 2 puffs or albuterol nebulizer.  ?o If you need to use your albuterol nebulizer machine back to back within 15-30 minutes with no relief then please go to the ER/urgent care for further evaluation.  ?? Get bloodwork to see if you qualify for biologics. ?? Get spirometry at next visit. ?

## 2022-02-11 NOTE — Assessment & Plan Note (Signed)
Past history - started AIT on 07/02/2016 (G/Weeds/T + M/DM/C/CR). Nasal sprays cause epistaxis. Patient has dry eyes.  ?Interim history - stable with mild localized reactions. ?? Continue allergy injections.  ?? Continue environmental control measures.  ?? May use over the counter antihistamines such as Zyrtec (cetirizine), Claritin (loratadine), Allegra (fexofenadine), or Xyzal (levocetirizine) daily as needed. May take twice a day during flares.  ?? Continue Singulair 10mg  daily at night.  ?? May use nasal saline spray (i.e., Simply Saline) as needed. ?? Declines nasal sprays due to epistaxis issues.  ?

## 2022-02-11 NOTE — Progress Notes (Signed)
RE: Brittany Riggs MRN: [id number] DOB: 1971/12/10 Date of Telemedicine Visit: 02/11/2022  Referring provider: [name], MD Primary care provider: [name], MD  Chief Complaint: Follow-up   Telemedicine Follow Up Visit via Telephone: I connected with Brittany Riggs for a follow up on 02/11/22 by telephone and verified that I am speaking with the correct person using two identifiers.   I discussed the limitations, risks, security and privacy concerns of performing an evaluation and management service by telephone and the availability of in person appointments. I also discussed with the patient that there may be a patient responsible charge related to this service. The patient expressed understanding and agreed to proceed.  Patient is at home/work. Provider is at the office.  Visit start time: 10:46AM Visit end time: 11:12AM Insurance consent/check in by: front desk Medical consent and medical assistant/nurse: Ashleigh F.  History of Present Illness: She is a 51 y.o. female, who is being followed for asthma, allergic rhinitis on AIT, food allergy. Her previous allergy office visit was on 10/21/2021 with  [name], FNP . Today is a regular follow up visit.  Severe persistent asthma Currently on Trelegy 245mcg 1 puff once a day and gets shortness of breath if forgets to take it.  Noticed some shortness of breath lately but ran out of albuterol 2 weeks ago and needs a refill.  Having shortness of breath about 1-2 times per week.  Denies any ER/urgent care visits or prednisone use since the last visit.  Last nebulizer use was in January 2023.   Patient is interested in starting biologics but prefers one that she can self inject at home as she works 2 shifts per day and is unable to come to the office.    Seasonal and perennial allergic rhinitis Doing well with allergy injections with mild localized reactions. Currently taking Claritin in the morning and allegra  at night.    Food allergy Avoiding shellfish and sesame. No reactions.    Assessment and Plan: [name] is a 51 y.o. female with: Not well controlled severe persistent asthma Doing better with Trelegy but the weather change is causing some shortness of breath. Denies any additional prednisone since the last visit. Interested in biologics if able to self-administer at home. Daily controller medication(s):  Continue Trelegy 270mcg 1 puff daily and rinse mouth after each use.  Continue Singulair (montelukast) 10mg  daily at night. During upper respiratory infections/asthma flares:  Start Pulmicort 0.5mg  nebulizer twice a day for 1-2 weeks until your breathing symptoms return to baseline.  Pretreat with albuterol 2 puffs or albuterol nebulizer.  If you need to use your albuterol nebulizer machine back to back within 15-30 minutes with no relief then please go to the ER/urgent care for further evaluation.  Get bloodwork to see if you qualify for biologics. Get spirometry at next visit.  Seasonal and perennial allergic rhinitis Past history - started AIT on 07/02/2016 (G/Weeds/T + M/DM/C/CR). Nasal sprays cause epistaxis. Patient has dry eyes.  Interim history - stable with mild localized reactions. Continue allergy injections.  Continue environmental control measures.  May use over the counter antihistamines such as Zyrtec (cetirizine), Claritin (loratadine), Allegra (fexofenadine), or Xyzal (levocetirizine) daily as needed. May take twice a day during flares.  Continue Singulair 10mg  daily at night.  May use nasal saline spray (i.e., Simply Saline) as needed. Declines nasal sprays due to epistaxis issues.   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 2021 bloodwork  borderline positive to shellfish. Continue to avoid shellfish and sesame. For mild symptoms you can take over the counter antihistamines such as Benadryl and monitor symptoms closely. If  symptoms worsen or if you have severe symptoms including breathing issues, throat closure, significant swelling, whole body hives, severe diarrhea and vomiting, lightheadedness then inject epinephrine and seek immediate medical care afterwards.  Return in about 3 months (around 05/14/2022).  Meds ordered this encounter  Medications   albuterol (VENTOLIN HFA) 108 (90 Base) MCG/ACT inhaler    Sig: Inhale 2 puffs into the lungs every 4 (four) hours as needed for wheezing or shortness of breath (coughing fits).    Dispense:  18 g    Refill:  1   TRELEGY ELLIPTA 200-62.5-25 MCG/ACT AEPB    Sig: Inhale 1 puff into the lungs daily. Rinse mouth after each use.    Dispense:  60 each    Refill:  5   Lab Orders         CBC with Differential/Platelet      Diagnostics: None.  Medication List:  Current Outpatient Medications  Medication Sig Dispense Refill   albuterol (PROAIR HFA) 108 (90 Base) MCG/ACT inhaler Inhale 2 puffs into the lungs every 6 (six) hours as needed for wheezing or shortness of breath. 18 g 1   albuterol (PROVENTIL) (2.5 MG/3ML) 0.083% nebulizer solution Take 3 mLs (2.5 mg total) by nebulization every 4 (four) hours as needed for wheezing or shortness of breath (coughing fits). 150 mL 1   albuterol (VENTOLIN HFA) 108 (90 Base) MCG/ACT inhaler Inhale 2 puffs into the lungs every 4 (four) hours as needed for wheezing or shortness of breath (coughing fits). 18 g 1   Ascorbic Acid (VITAMIN C) 1000 MG tablet 1 tablet     Beclomethasone Dipropionate (QNASL) 80 MCG/ACT AERS 2 sprays     budesonide (PULMICORT) 0.5 MG/2ML nebulizer solution Take 2 mLs (0.5 mg total) by nebulization in the morning and at bedtime. 120 mL 1   carvedilol (COREG) 3.125 MG tablet Take 3.125 mg by mouth 2 (two) times daily with a meal.      chlorthalidone (HYGROTON) 25 MG tablet 1 tablet in the morning with food     Cholecalciferol (VITAMIN D3) 5000 units TABS Take 5,000 Units by mouth daily.      EPINEPHrine  (AUVI-Q) 0.3 mg/0.3 mL IJ SOAJ injection Inject 0.3 mg into the muscle as needed for anaphylaxis. 1 each 1   Eszopiclone 3 MG TABS Take 3 mg by mouth at bedtime as needed (sleep). Take immediately before bedtime     hydrocortisone (ANUSOL-HC) 2.5 % rectal cream 1 application to affected area     hydroxychloroquine (PLAQUENIL) 200 MG tablet Take 200 mg by mouth daily. 2 tabs daily     hydroxychloroquine (PLAQUENIL) 200 MG tablet See admin instructions.     irbesartan (AVAPRO) 300 MG tablet 1 tablet     JOLESSA 0.15-0.03 MG tablet Take 1 tablet by mouth daily.     levonorgestrel-ethinyl estradiol (SEASONALE) 0.15-0.03 MG tablet 1 tablet     Lifitegrast 5 % SOLN Place 1 drop into both eyes 2 (two) times daily.     loperamide (IMODIUM) 2 MG capsule Take 1 capsule (2 mg total) by mouth 4 (four) times daily as needed for diarrhea or loose stools. 20 capsule 0   loratadine (CLARITIN) 10 MG tablet Take 10 mg by mouth in the morning. Reported on 06/23/2016     metaxalone (SKELAXIN) 800 MG tablet 1 tablet  metroNIDAZOLE (METROCREAM) 0.75 % cream Apply 1 application topically See admin instructions.      montelukast (SINGULAIR) 10 MG tablet Take 1 tablet (10 mg total) by mouth at bedtime. 90 tablet 1   naratriptan (AMERGE) 2.5 MG tablet Take 2.5 mg by mouth as needed for migraine.   5   NURTEC 75 MG TBDP Take 1 tablet by mouth daily as needed.     ondansetron (ZOFRAN ODT) 4 MG disintegrating tablet Take 1 tablet (4 mg total) by mouth every 8 (eight) hours as needed for nausea or vomiting. 21 tablet 0   pantoprazole (PROTONIX) 40 MG tablet Take 40 mg by mouth daily.     promethazine-dextromethorphan (PROMETHAZINE-DM) 6.25-15 MG/5ML syrup 5 ml as needed     Respiratory Therapy Supplies (FLUTTER) DEVI Use as directed 1 each 0   Rimegepant Sulfate (NURTEC) 75 MG TBDP      tiZANidine (ZANAFLEX) 2 MG tablet Take 1 tablet (2 mg total) by mouth at bedtime as needed for muscle spasms. 30 tablet 0   traMADol  (ULTRAM) 50 MG tablet 1-2 every 4 hours as needed for cough or pain (Patient taking differently: Take 50-100 mg by mouth every 4 (four) hours as needed (pain). 1-2 every 4 hours as needed for cough or pain) 40 tablet 0   TRELEGY ELLIPTA 200-62.5-25 MCG/ACT AEPB Inhale 1 puff into the lungs daily. Rinse mouth after each use. 60 each 5   valACYclovir (VALTREX) 500 MG tablet Take 500 mg by mouth daily.     venlafaxine XR (EFFEXOR-XR) 150 MG 24 hr capsule Take 150 mg by mouth 2 (two) times daily with a meal.      Vitamin D, Ergocalciferol, 50000 units CAPS 1 capsule     No current facility-administered medications for this visit.   Allergies: Allergies  Allergen Reactions   Amoxicillin Hives   Codeine Itching   Penicillins Hives    Has patient had a PCN reaction causing immediate rash, facial/tongue/throat swelling, SOB or lightheadedness with hypotension: Yes Has patient had a PCN reaction causing severe rash involving mucus membranes or skin necrosis: no Has patient had a PCN reaction that required hospitalization: no Has patient had a PCN reaction occurring within the last 10 years: no If all of the above answers are "NO", then may proceed with Cephalosporin use.    Prednisone Other (See Comments)    Severe cramps and "spasms"   Sulfa Antibiotics Other (See Comments)    Does not remember side effects   Latex Rash and Other (See Comments)    "Sometimes bothers me"   I reviewed her past medical history, social history, family history, and environmental history and no significant changes have been reported from her previous visit.  Review of Systems  Constitutional:  Negative for appetite change, chills, fever and unexpected weight change.  HENT:  Negative for congestion, rhinorrhea and sneezing.   Eyes:  Negative for itching.  Respiratory:  Positive for shortness of breath. Negative for cough, chest tightness and wheezing.   Gastrointestinal:  Negative for abdominal pain.  Skin:   Negative for rash.  Allergic/Immunologic: Positive for environmental allergies and food allergies.  Neurological:  Negative for headaches.   Objective: Physical Exam Not obtained as encounter was done via telephone.   Previous notes and tests were reviewed.  I discussed the assessment and treatment plan with the patient. The patient was provided an opportunity to ask questions and all were answered. The patient agreed with the plan and demonstrated an understanding  of the instructions. After visit summary/patient instructions available via mychart.   The patient was advised to call back or seek an in-person evaluation if the symptoms worsen or if the condition fails to improve as anticipated.  I provided 26 minutes of non-face-to-face time during this encounter.  It was my pleasure to participate in [address] care today. Please feel free to contact me with any questions or concerns.   Sincerely,  Rexene Alberts, DO Allergy & Immunology  [facility] office: [place] office: [phone]

## 2022-02-11 NOTE — Patient Instructions (Addendum)
Asthma:  ?Daily controller medication(s):  ?Continue Trelegy 218mcg 1 puff daily and rinse mouth after each use.  ?Continue Singulair (montelukast) 10mg  daily at night. ?During upper respiratory infections/asthma flares:  ?Start Pulmicort 0.5mg  nebulizer twice a day for 1-2 weeks until your breathing symptoms return to baseline.  ?Pretreat with albuterol 2 puffs or albuterol nebulizer.  ?If you need to use your albuterol nebulizer machine back to back within 15-30 minutes with no relief then please go to the ER/urgent care for further evaluation.  ?Asthma control goals:  ?Full participation in all desired activities (may need albuterol before activity) ?Albuterol use two times or less a week on average (not counting use with activity) ?Cough interfering with sleep two times or less a month ?Oral steroids no more than once a year ?No hospitalizations ?  ?Get bloodwork to see if you qualify for biologics.  ?We are ordering labs, so please allow 1-2 weeks for the results to come back. ?With the newly implemented Cures Act, the labs might be visible to you at the same time that they become visible to me. However, I will not address the results until all of the results are back, so please be patient.  ?In the meantime, continue recommendations in your patient instructions, including avoidance measures (if applicable), until you hear from me. ? ?Seasonal and perennial allergic rhinitis ?Continue allergy injections.  ?Continue environmental control measures.  ?May use over the counter antihistamines such as Zyrtec (cetirizine), Claritin (loratadine), Allegra (fexofenadine), or Xyzal (levocetirizine) daily as needed. May take twice a day during flares.  ?May use nasal saline spray (i.e., Simply Saline) as needed. ?  ?Food allergy ?Continue to avoid shellfish and sesame. ?For mild symptoms you can take over the counter antihistamines such as Benadryl and monitor symptoms closely. If symptoms worsen or if you have severe  symptoms including breathing issues, throat closure, significant swelling, whole body hives, severe diarrhea and vomiting, lightheadedness then inject epinephrine and seek immediate medical care afterwards. ? ?Follow up in 3 months or sooner if needed - please make this appointment to be in PERSON. ?During your next allergy shot - get bloodwork and get breathing test. ?Make sure no prednisone for 2-4 weeks before the bloodwork is drawn. ? ? ?

## 2022-02-25 ENCOUNTER — Ambulatory Visit (INDEPENDENT_AMBULATORY_CARE_PROVIDER_SITE_OTHER): Payer: 59

## 2022-02-25 DIAGNOSIS — J309 Allergic rhinitis, unspecified: Secondary | ICD-10-CM

## 2022-03-26 ENCOUNTER — Ambulatory Visit (INDEPENDENT_AMBULATORY_CARE_PROVIDER_SITE_OTHER): Payer: 59

## 2022-03-26 DIAGNOSIS — J309 Allergic rhinitis, unspecified: Secondary | ICD-10-CM | POA: Diagnosis not present

## 2022-04-13 DIAGNOSIS — J3089 Other allergic rhinitis: Secondary | ICD-10-CM

## 2022-04-13 NOTE — Progress Notes (Signed)
VIALS EXP 04-14-23 ?

## 2022-04-16 ENCOUNTER — Ambulatory Visit (INDEPENDENT_AMBULATORY_CARE_PROVIDER_SITE_OTHER): Payer: 59

## 2022-04-16 DIAGNOSIS — J309 Allergic rhinitis, unspecified: Secondary | ICD-10-CM

## 2022-05-14 ENCOUNTER — Other Ambulatory Visit: Payer: Self-pay | Admitting: Rheumatology

## 2022-05-14 DIAGNOSIS — R7989 Other specified abnormal findings of blood chemistry: Secondary | ICD-10-CM

## 2022-05-18 ENCOUNTER — Ambulatory Visit (INDEPENDENT_AMBULATORY_CARE_PROVIDER_SITE_OTHER): Payer: 59

## 2022-05-18 DIAGNOSIS — J309 Allergic rhinitis, unspecified: Secondary | ICD-10-CM | POA: Diagnosis not present

## 2022-05-25 ENCOUNTER — Other Ambulatory Visit: Payer: 59

## 2022-05-28 ENCOUNTER — Ambulatory Visit
Admission: RE | Admit: 2022-05-28 | Discharge: 2022-05-28 | Disposition: A | Discharge status: Home / Self Care | Payer: 59 | Source: Ambulatory Visit | Attending: Rheumatology | Admitting: Rheumatology

## 2022-05-28 DIAGNOSIS — R7989 Other specified abnormal findings of blood chemistry: Secondary | ICD-10-CM

## 2022-06-22 ENCOUNTER — Ambulatory Visit (INDEPENDENT_AMBULATORY_CARE_PROVIDER_SITE_OTHER): Payer: 59 | Admitting: *Deleted

## 2022-06-22 DIAGNOSIS — J309 Allergic rhinitis, unspecified: Secondary | ICD-10-CM

## 2022-07-07 ENCOUNTER — Ambulatory Visit (INDEPENDENT_AMBULATORY_CARE_PROVIDER_SITE_OTHER): Payer: 59

## 2022-07-07 DIAGNOSIS — J309 Allergic rhinitis, unspecified: Secondary | ICD-10-CM | POA: Diagnosis not present

## 2022-07-17 ENCOUNTER — Ambulatory Visit (INDEPENDENT_AMBULATORY_CARE_PROVIDER_SITE_OTHER): Payer: 59

## 2022-07-17 DIAGNOSIS — J309 Allergic rhinitis, unspecified: Secondary | ICD-10-CM

## 2022-07-27 ENCOUNTER — Ambulatory Visit (INDEPENDENT_AMBULATORY_CARE_PROVIDER_SITE_OTHER): Payer: 59 | Admitting: *Deleted

## 2022-07-27 DIAGNOSIS — J309 Allergic rhinitis, unspecified: Secondary | ICD-10-CM | POA: Diagnosis not present

## 2022-08-04 ENCOUNTER — Ambulatory Visit (INDEPENDENT_AMBULATORY_CARE_PROVIDER_SITE_OTHER): Payer: 59 | Admitting: *Deleted

## 2022-08-04 DIAGNOSIS — J309 Allergic rhinitis, unspecified: Secondary | ICD-10-CM | POA: Diagnosis not present

## 2022-08-11 ENCOUNTER — Ambulatory Visit (INDEPENDENT_AMBULATORY_CARE_PROVIDER_SITE_OTHER): Payer: 59 | Admitting: *Deleted

## 2022-08-11 DIAGNOSIS — J309 Allergic rhinitis, unspecified: Secondary | ICD-10-CM

## 2022-09-08 ENCOUNTER — Ambulatory Visit (INDEPENDENT_AMBULATORY_CARE_PROVIDER_SITE_OTHER): Payer: 59 | Admitting: *Deleted

## 2022-09-08 DIAGNOSIS — J309 Allergic rhinitis, unspecified: Secondary | ICD-10-CM | POA: Diagnosis not present

## 2022-10-02 ENCOUNTER — Ambulatory Visit
Admission: RE | Admit: 2022-10-02 | Discharge: 2022-10-02 | Disposition: A | Discharge status: Home / Self Care | Payer: 59 | Source: Ambulatory Visit | Attending: Family Medicine | Admitting: Family Medicine

## 2022-10-02 ENCOUNTER — Other Ambulatory Visit: Payer: Self-pay | Admitting: Family Medicine

## 2022-10-02 DIAGNOSIS — Z1231 Encounter for screening mammogram for malignant neoplasm of breast: Secondary | ICD-10-CM

## 2022-10-12 ENCOUNTER — Ambulatory Visit (INDEPENDENT_AMBULATORY_CARE_PROVIDER_SITE_OTHER): Payer: 59

## 2022-10-12 DIAGNOSIS — J309 Allergic rhinitis, unspecified: Secondary | ICD-10-CM

## 2022-11-09 ENCOUNTER — Ambulatory Visit (INDEPENDENT_AMBULATORY_CARE_PROVIDER_SITE_OTHER): Payer: 59

## 2022-11-09 DIAGNOSIS — J309 Allergic rhinitis, unspecified: Secondary | ICD-10-CM | POA: Diagnosis not present

## 2022-12-10 ENCOUNTER — Ambulatory Visit (INDEPENDENT_AMBULATORY_CARE_PROVIDER_SITE_OTHER): Payer: 59

## 2022-12-10 DIAGNOSIS — J309 Allergic rhinitis, unspecified: Secondary | ICD-10-CM

## 2022-12-17 DIAGNOSIS — J3089 Other allergic rhinitis: Secondary | ICD-10-CM

## 2022-12-18 NOTE — Progress Notes (Signed)
VIALS EXP 12-19-23

## 2022-12-30 ENCOUNTER — Other Ambulatory Visit: Payer: Self-pay | Admitting: Gastroenterology

## 2022-12-30 DIAGNOSIS — R7989 Other specified abnormal findings of blood chemistry: Secondary | ICD-10-CM

## 2023-01-14 ENCOUNTER — Ambulatory Visit (INDEPENDENT_AMBULATORY_CARE_PROVIDER_SITE_OTHER): Payer: 59

## 2023-01-14 DIAGNOSIS — J309 Allergic rhinitis, unspecified: Secondary | ICD-10-CM | POA: Diagnosis not present

## 2023-01-19 ENCOUNTER — Ambulatory Visit
Admission: RE | Admit: 2023-01-19 | Discharge: 2023-01-19 | Disposition: A | Discharge status: Home / Self Care | Payer: 59 | Source: Ambulatory Visit | Attending: Gastroenterology | Admitting: Gastroenterology

## 2023-01-19 DIAGNOSIS — R7989 Other specified abnormal findings of blood chemistry: Secondary | ICD-10-CM

## 2023-01-21 ENCOUNTER — Telehealth: Payer: Self-pay | Admitting: Allergy

## 2023-01-21 NOTE — Telephone Encounter (Signed)
Patient called and needs refills on meds and made a televisit for 02/15/2003. She needs qvar, trelegy, Dulera and needs them sent to [facility] on [address] And would like to know what meds she on that she will be taking for while because she would need to use her mail order pharmacy. [phone]

## 2023-01-25 ENCOUNTER — Other Ambulatory Visit (HOSPITAL_COMMUNITY): Payer: Self-pay | Admitting: Gastroenterology

## 2023-01-25 DIAGNOSIS — R7989 Other specified abnormal findings of blood chemistry: Secondary | ICD-10-CM

## 2023-02-02 ENCOUNTER — Ambulatory Visit (HOSPITAL_COMMUNITY): Payer: 59

## 2023-02-03 ENCOUNTER — Telehealth: Payer: Self-pay | Admitting: Allergy

## 2023-02-03 MED ORDER — TRELEGY ELLIPTA 200-62.5-25 MCG/ACT IN AEPB: 1.0000 | INHALATION_SPRAY | Freq: Every day | RESPIRATORY_TRACT | 0 refills | Status: DC

## 2023-02-03 NOTE — Telephone Encounter (Signed)
I do not see where pt is on qvar so was unable to send that in but did send in trelegy to [facility] for pt to call us back about this

## 2023-02-03 NOTE — Telephone Encounter (Signed)
Patient called and said that she has not heard from a nurse about her Qvar, Trelegy that she needs refills. She has a televisit on 02/15/2023. Please call her [phone]

## 2023-02-04 NOTE — Telephone Encounter (Signed)
Called and left a voicemail asking for patient to return call to discuss.  °

## 2023-02-10 ENCOUNTER — Ambulatory Visit (HOSPITAL_COMMUNITY)
Admission: RE | Admit: 2023-02-10 | Discharge: 2023-02-10 | Disposition: A | Discharge status: Home / Self Care | Payer: 59 | Source: Ambulatory Visit | Attending: Gastroenterology | Admitting: Gastroenterology

## 2023-02-10 ENCOUNTER — Ambulatory Visit (INDEPENDENT_AMBULATORY_CARE_PROVIDER_SITE_OTHER): Payer: 59

## 2023-02-10 DIAGNOSIS — J309 Allergic rhinitis, unspecified: Secondary | ICD-10-CM

## 2023-02-10 DIAGNOSIS — R7989 Other specified abnormal findings of blood chemistry: Secondary | ICD-10-CM

## 2023-02-10 MED ORDER — TRELEGY ELLIPTA 200-62.5-25 MCG/ACT IN AEPB: 1.0000 | INHALATION_SPRAY | Freq: Every day | RESPIRATORY_TRACT | 0 refills | Status: DC

## 2023-02-10 NOTE — Addendum Note (Signed)
Addended by: Herbie Drape on: 02/10/2023 10:09 AM   Modules accepted: Orders

## 2023-02-10 NOTE — Telephone Encounter (Signed)
Sent in one refill of Trelegy 212mg.  She is not supposed to have Dulera and Qvar.  Will clarify her regimen at next OV on March 4th.

## 2023-02-10 NOTE — Telephone Encounter (Signed)
Patient came in to get her allergy injections and asked if refills were sent in. I informed her Trelegy was sent in to [facility] however patient stated that is not the correct pharmacy and she would like it sent to [facility] on [address] I informed patient that I could resent the Trelegy however she would need to keep her appointment on 02/15/2023. Patient stated that she is also on Dulera or Qvar and would like those sent in as well. I informed patient we would have to reach out to the provider as it does not state these on her after visit summary from the last office visit. Patient verbalized understanding and would like a return call once refills have been sent.

## 2023-02-10 NOTE — Addendum Note (Signed)
Addended by: Garnet Sierras on: 02/10/2023 05:27 PM   Modules accepted: Orders

## 2023-02-11 ENCOUNTER — Telehealth: Payer: Self-pay | Admitting: Allergy

## 2023-02-11 NOTE — Telephone Encounter (Signed)
I left a message for the patient to call back for an in person visit.

## 2023-02-11 NOTE — Telephone Encounter (Signed)
Please call patient and have her schedule a regular in person office visit.  She hasn't been seen in person for over 1 year as her last visit with me on 02/11/2022 was also a televisit.  She needs spirometry.   You can offer her an in person visit with Webb Silversmith or Chrissie as well if my schedule doesn't allow with her availabilities.   Thank you!

## 2023-02-15 ENCOUNTER — Telehealth: Payer: 59 | Admitting: Allergy

## 2023-03-07 NOTE — Progress Notes (Unsigned)
Follow Up Note  RE: Brittany Riggs MRN: [id number] DOB: 05-17-1971 Date of Office Visit: 03/08/2023  Referring provider: [name], MD Primary care provider: [name], MD  Chief Complaint: No chief complaint on file.  History of Present Illness: I had the pleasure of seeing [name] for a follow up visit at the [facility] on 03/07/2023. She is a 52 y.o. female, who is being followed for asthma, allergic rhinitis on AIT, food allergy. Her previous allergy office visit was on 02/11/2022 with Dr. [name] via telemedicine. Today is a regular follow up visit.  Not well controlled severe persistent asthma Doing better with Trelegy but the weather change is causing some shortness of breath. Denies any additional prednisone since the last visit. Interested in biologics if able to self-administer at home. Daily controller medication(s):  Continue Trelegy 275mcg 1 puff daily and rinse mouth after each use.  Continue Singulair (montelukast) 10mg  daily at night. During upper respiratory infections/asthma flares:  Start Pulmicort 0.5mg  nebulizer twice a day for 1-2 weeks until your breathing symptoms return to baseline.  Pretreat with albuterol 2 puffs or albuterol nebulizer.  If you need to use your albuterol nebulizer machine back to back within 15-30 minutes with no relief then please go to the ER/urgent care for further evaluation.  Get bloodwork to see if you qualify for biologics. Get spirometry at next visit.   Seasonal and perennial allergic rhinitis Past history - started AIT on 07/02/2016 (G/Weeds/T + M/DM/C/CR). Nasal sprays cause epistaxis. Patient has dry eyes.  Interim history - stable with mild localized reactions. Continue allergy injections.  Continue environmental control measures.  May use over the counter antihistamines such as Zyrtec (cetirizine), Claritin (loratadine), Allegra (fexofenadine), or Xyzal (levocetirizine) daily as needed. May  take twice a day during flares.  Continue Singulair 10mg  daily at night.  May use nasal saline spray (i.e., Simply Saline) as needed. Declines nasal sprays due to epistaxis issues.    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 2021 bloodwork borderline positive to shellfish. Continue to avoid shellfish and sesame. For mild symptoms you can take over the counter antihistamines such as Benadryl and monitor symptoms closely. If symptoms worsen or if you have severe symptoms including breathing issues, throat closure, significant swelling, whole body hives, severe diarrhea and vomiting, lightheadedness then inject epinephrine and seek immediate medical care afterwards.   Return in about 3 months (around 05/14/2022).  Assessment and Plan: Brittany Riggs is a 52 y.o. female with: No problem-specific Assessment & Plan notes found for this encounter.  No follow-ups on file.  No orders of the defined types were placed in this encounter.  Lab Orders  No laboratory test(s) ordered today    Diagnostics: Spirometry:  Tracings reviewed. Her effort: {Blank single:19197::"Good reproducible efforts.","It was hard to get consistent efforts and there is a question as to whether this reflects a maximal maneuver.","Poor effort, data can not be interpreted."} FVC: ***L FEV1: ***L, ***% predicted FEV1/FVC ratio: ***% Interpretation: {Blank single:19197::"Spirometry consistent with mild obstructive disease","Spirometry consistent with moderate obstructive disease","Spirometry consistent with severe obstructive disease","Spirometry consistent with possible restrictive disease","Spirometry consistent with mixed obstructive and restrictive disease","Spirometry uninterpretable due to technique","Spirometry consistent with normal pattern","No overt abnormalities noted given today's efforts"}.  Please see scanned spirometry results for details.  Skin Testing: {Blank  single:19197::"Select foods","Environmental allergy panel","Environmental allergy panel and select foods","Food allergy panel","None","Deferred due to recent antihistamines use"}. *** Results discussed with patient/family.  Medication List:  Current Outpatient Medications  Medication Sig Dispense Refill   albuterol (PROAIR HFA) 108 (90 Base) MCG/ACT inhaler Inhale 2 puffs into the lungs every 6 (six) hours as needed for wheezing or shortness of breath. 18 g 1   albuterol (PROVENTIL) (2.5 MG/3ML) 0.083% nebulizer solution Take 3 mLs (2.5 mg total) by nebulization every 4 (four) hours as needed for wheezing or shortness of breath (coughing fits). 150 mL 1   albuterol (VENTOLIN HFA) 108 (90 Base) MCG/ACT inhaler Inhale 2 puffs into the lungs every 4 (four) hours as needed for wheezing or shortness of breath (coughing fits). 18 g 1   Ascorbic Acid (VITAMIN C) 1000 MG tablet 1 tablet     Beclomethasone Dipropionate (QNASL) 80 MCG/ACT AERS 2 sprays     budesonide (PULMICORT) 0.5 MG/2ML nebulizer solution Take 2 mLs (0.5 mg total) by nebulization in the morning and at bedtime. 120 mL 1   carvedilol (COREG) 3.125 MG tablet Take 3.125 mg by mouth 2 (two) times daily with a meal.      chlorthalidone (HYGROTON) 25 MG tablet 1 tablet in the morning with food     Cholecalciferol (VITAMIN D3) 5000 units TABS Take 5,000 Units by mouth daily.      EPINEPHrine (AUVI-Q) 0.3 mg/0.3 mL IJ SOAJ injection Inject 0.3 mg into the muscle as needed for anaphylaxis. 1 each 1   Eszopiclone 3 MG TABS Take 3 mg by mouth at bedtime as needed (sleep). Take immediately before bedtime     Fluticasone-Umeclidin-Vilant (TRELEGY ELLIPTA) 200-62.5-25 MCG/ACT AEPB Inhale 1 puff into the lungs daily. Rinse mouth after each use. 60 each 0   hydrocortisone (ANUSOL-HC) 2.5 % rectal cream 1 application to affected area     hydroxychloroquine (PLAQUENIL) 200 MG tablet Take 200 mg by mouth daily. 2 tabs daily     hydroxychloroquine  (PLAQUENIL) 200 MG tablet See admin instructions.     irbesartan (AVAPRO) 300 MG tablet 1 tablet     JOLESSA 0.15-0.03 MG tablet Take 1 tablet by mouth daily.     levonorgestrel-ethinyl estradiol (SEASONALE) 0.15-0.03 MG tablet 1 tablet     Lifitegrast 5 % SOLN Place 1 drop into both eyes 2 (two) times daily.     loperamide (IMODIUM) 2 MG capsule Take 1 capsule (2 mg total) by mouth 4 (four) times daily as needed for diarrhea or loose stools. 20 capsule 0   loratadine (CLARITIN) 10 MG tablet Take 10 mg by mouth in the morning. Reported on 06/23/2016     metaxalone (SKELAXIN) 800 MG tablet 1 tablet     metroNIDAZOLE (METROCREAM) 0.75 % cream Apply 1 application topically See admin instructions.      montelukast (SINGULAIR) 10 MG tablet Take 1 tablet (10 mg total) by mouth at bedtime. 90 tablet 1   naratriptan (AMERGE) 2.5 MG tablet Take 2.5 mg by mouth as needed for migraine.   5   NURTEC 75 MG TBDP Take 1 tablet by mouth daily as needed.     ondansetron (ZOFRAN ODT) 4 MG disintegrating tablet Take 1 tablet (4 mg total) by mouth every 8 (eight) hours as needed for nausea or vomiting. 21 tablet 0   pantoprazole (PROTONIX) 40 MG tablet Take 40 mg by mouth daily.     promethazine-dextromethorphan (PROMETHAZINE-DM) 6.25-15 MG/5ML syrup 5 ml as needed     Respiratory Therapy Supplies (FLUTTER) DEVI Use as directed 1 each 0   Rimegepant Sulfate (NURTEC) 75 MG TBDP      tiZANidine (  ZANAFLEX) 2 MG tablet Take 1 tablet (2 mg total) by mouth at bedtime as needed for muscle spasms. 30 tablet 0   traMADol (ULTRAM) 50 MG tablet 1-2 every 4 hours as needed for cough or pain (Patient taking differently: Take 50-100 mg by mouth every 4 (four) hours as needed (pain). 1-2 every 4 hours as needed for cough or pain) 40 tablet 0   valACYclovir (VALTREX) 500 MG tablet Take 500 mg by mouth daily.     venlafaxine XR (EFFEXOR-XR) 150 MG 24 hr capsule Take 150 mg by mouth 2 (two) times daily with a meal.      Vitamin D,  Ergocalciferol, 50000 units CAPS 1 capsule     No current facility-administered medications for this visit.   Allergies: Allergies  Allergen Reactions   Amoxicillin Hives   Codeine Itching   Penicillins Hives    Has patient had a PCN reaction causing immediate rash, facial/tongue/throat swelling, SOB or lightheadedness with hypotension: Yes Has patient had a PCN reaction causing severe rash involving mucus membranes or skin necrosis: no Has patient had a PCN reaction that required hospitalization: no Has patient had a PCN reaction occurring within the last 10 years: no If all of the above answers are "NO", then may proceed with Cephalosporin use.    Prednisone Other (See Comments)    Severe cramps and "spasms"   Sulfa Antibiotics Other (See Comments)    Does not remember side effects   Latex Rash and Other (See Comments)    "Sometimes bothers me"   I reviewed her past medical history, social history, family history, and environmental history and no significant changes have been reported from her previous visit.  Review of Systems  Constitutional:  Negative for appetite change, chills, fever and unexpected weight change.  HENT:  Negative for congestion, rhinorrhea and sneezing.   Eyes:  Negative for itching.  Respiratory:  Positive for shortness of breath. Negative for cough, chest tightness and wheezing.   Gastrointestinal:  Negative for abdominal pain.  Skin:  Negative for rash.  Allergic/Immunologic: Positive for environmental allergies and food allergies.  Neurological:  Negative for headaches.    Objective: There were no vitals taken for this visit. There is no height or weight on file to calculate BMI. Physical Exam Vitals and nursing note reviewed.  Constitutional:      Appearance: Normal appearance. She is well-developed.  HENT:     Head: Normocephalic and atraumatic.     Right Ear: Tympanic membrane and external ear normal.     Left Ear: Tympanic membrane and  external ear normal.     Nose: Nose normal.     Mouth/Throat:     Mouth: Mucous membranes are moist.     Pharynx: Oropharynx is clear.  Eyes:     Conjunctiva/sclera: Conjunctivae normal.  Cardiovascular:     Rate and Rhythm: Normal rate and regular rhythm.     Heart sounds: Normal heart sounds. No murmur heard. Pulmonary:     Effort: Pulmonary effort is normal.     Breath sounds: Normal breath sounds. No wheezing, rhonchi or rales.  Musculoskeletal:     Cervical back: Neck supple.  Skin:    General: Skin is warm.     Findings: No rash.  Neurological:     Mental Status: She is alert and oriented to person, place, and time.  Psychiatric:        Behavior: Behavior normal.    Previous notes and tests were reviewed. The plan  was reviewed with the patient/family, and all questions/concerned were addressed.  It was my pleasure to see Nikeya today and participate in her care. Please feel free to contact me with any questions or concerns.  Sincerely,  Rexene Alberts, DO Allergy & Immunology  [facility] office: [place] office: [phone]

## 2023-03-08 ENCOUNTER — Other Ambulatory Visit: Payer: Self-pay

## 2023-03-08 ENCOUNTER — Encounter: Payer: Self-pay | Admitting: Allergy

## 2023-03-08 ENCOUNTER — Ambulatory Visit (INDEPENDENT_AMBULATORY_CARE_PROVIDER_SITE_OTHER): Payer: 59 | Admitting: Allergy

## 2023-03-08 VITALS — BP 130/84 | HR 73 | Temp 96.5°F | Resp 16 | Ht 67.32 in | Wt 332.9 lb

## 2023-03-08 DIAGNOSIS — J4550 Severe persistent asthma, uncomplicated: Secondary | ICD-10-CM

## 2023-03-08 DIAGNOSIS — J302 Other seasonal allergic rhinitis: Secondary | ICD-10-CM

## 2023-03-08 DIAGNOSIS — J309 Allergic rhinitis, unspecified: Secondary | ICD-10-CM

## 2023-03-08 DIAGNOSIS — J3089 Other allergic rhinitis: Secondary | ICD-10-CM

## 2023-03-08 DIAGNOSIS — T7800XD Anaphylactic reaction due to unspecified food, subsequent encounter: Secondary | ICD-10-CM | POA: Diagnosis not present

## 2023-03-08 MED ORDER — ALBUTEROL SULFATE HFA 108 (90 BASE) MCG/ACT IN AERS: 2.0000 | INHALATION_SPRAY | RESPIRATORY_TRACT | 1 refills | Status: DC | PRN

## 2023-03-08 MED ORDER — EPINEPHRINE 0.3 MG/0.3ML IJ SOAJ: 0.3000 mg | INTRAMUSCULAR | 1 refills | Status: DC | PRN

## 2023-03-08 NOTE — Patient Instructions (Addendum)
Asthma:  Daily controller medication(s):  Try Breztri 2 puffs twice a day with spacer and rinse mouth afterwards. 2 samples given to last you 2 weeks. If this works better than trelegy let me know. Otherwise use Trelegy 269mcg 1 puff daily and rinse mouth after each use.  Continue Singulair (montelukast) 10mg  daily at night. During respiratory infections/asthma flares:  Start Pulmicort 0.5mg  nebulizer twice a day for 1-2 weeks until your breathing symptoms return to baseline.  Pretreat with albuterol 2 puffs or albuterol nebulizer.  If you need to use your albuterol nebulizer machine back to back within 15-30 minutes with no relief then please go to the ER/urgent care for further evaluation.  Asthma control goals:  Full participation in all desired activities (may need albuterol before activity) Albuterol use two times or less a week on average (not counting use with activity) Cough interfering with sleep two times or less a month Oral steroids no more than once a year No hospitalizations   Seasonal and perennial allergic rhinitis Continue allergy injections - given today.  Continue environmental control measures.  Continue Singulair (montelukast) 10mg  daily at night. May use over the counter antihistamines such as Zyrtec (cetirizine), Claritin (loratadine), Allegra (fexofenadine), or Xyzal (levocetirizine) daily as needed. May take twice a day during flares.  May use nasal saline spray (i.e., Simply Saline) as needed.   Food allergy Continue to avoid shellfish and sesame. For mild symptoms you can take over the counter antihistamines such as Benadryl and monitor symptoms closely. If symptoms worsen or if you have severe symptoms including breathing issues, throat closure, significant swelling, whole body hives, severe diarrhea and vomiting, lightheadedness then inject epinephrine and seek immediate medical care afterwards.  Follow up in 2-3 months or sooner if needed.

## 2023-03-08 NOTE — Assessment & Plan Note (Signed)
Increased shortness of breath x 1 month requiring albuterol every 1-2 days. Today's spirometry showed: no overt abnormalities with 3% improvement in FEV1 post bronchodilator treatment. Clinically feeling unchanged.  Daily controller medication(s):  Try Breztri 2 puffs twice a day with spacer and rinse mouth afterwards. 2 samples given to last 2 weeks. If this works better than trelegy let me know. Otherwise will go back to Trelegy 298mcg 1 puff daily and rinse mouth after each use.  Continue Singulair (montelukast) 10mg  daily at night. During respiratory infections/asthma flares:  Start Pulmicort 0.5mg  nebulizer twice a day for 1-2 weeks until your breathing symptoms return to baseline.  Pretreat with albuterol 2 puffs or albuterol nebulizer.  If you need to use your albuterol nebulizer machine back to back within 15-30 minutes with no relief then please go to the ER/urgent care for further evaluation.  If no improvement, get bloodwork to see if qualifies for biologics - patient wanted to try new inhaler first.  History of anemia - no recent CBC in epic EMR but states she had some bloodwork drawn recently.

## 2023-03-08 NOTE — Assessment & Plan Note (Signed)
Past history - started AIT on 07/02/2016 (G/Weeds/T + M/DM/C/CR). Nasal sprays cause epistaxis. Patient has dry eyes.  Interim history - stable with localized reactions. Symptoms return if stops medications. Continue allergy injections - given today.  Continue environmental control measures.  May use over the counter antihistamines such as Zyrtec (cetirizine), Claritin (loratadine), Allegra (fexofenadine), or Xyzal (levocetirizine) daily as needed. May take twice a day during flares.  Continue Singulair (montelukast) 10mg  daily at night. May use nasal saline spray (i.e., Simply Saline) as needed.

## 2023-03-08 NOTE — Assessment & Plan Note (Signed)
>>  ASSESSMENT AND PLAN FOR NOT WELL CONTROLLED MODERATE PERSISTENT ASTHMA WRITTEN ON 03/08/2023  5:10 PM BY Ellamae Sia, DO  Increased shortness of breath x 1 month requiring albuterol every 1-2 days. Today's spirometry showed: no overt abnormalities with 3% improvement in FEV1 post bronchodilator treatment. Clinically feeling unchanged.  Daily controller medication(s):  Try Breztri 2 puffs twice a day with spacer and rinse mouth afterwards. 2 samples given to last 2 weeks. If this works better than trelegy let me know. Otherwise will go back to Trelegy 1 puff daily and rinse mouth after each use.  Continue Singulair (montelukast) 10mg  daily at night. During respiratory infections/asthma flares:  Start Pulmicort 0.5mg  nebulizer twice a day for 1-2 weeks until your breathing symptoms return to baseline.  Pretreat with albuterol 2 puffs or albuterol nebulizer.  If you need to use your albuterol nebulizer machine back to back within 15-30 minutes with no relief then please go to the ER/urgent care for further evaluation.  If no improvement, get bloodwork to see if qualifies for biologics - patient wanted to try new inhaler first.  History of anemia - no recent CBC in epic EMR but states she had some bloodwork drawn recently.

## 2023-03-08 NOTE — Assessment & Plan Note (Signed)
Past history -  Sesame causes itching. 2021 bloodwork borderline positive to shellfish. Interim history - no reactions. Continue to avoid shellfish and sesame. For mild symptoms you can take over the counter antihistamines such as Benadryl and monitor symptoms closely. If symptoms worsen or if you have severe symptoms including breathing issues, throat closure, significant swelling, whole body hives, severe diarrhea and vomiting, lightheadedness then inject epinephrine and seek immediate medical care afterwards.

## 2023-03-22 ENCOUNTER — Ambulatory Visit (INDEPENDENT_AMBULATORY_CARE_PROVIDER_SITE_OTHER): Payer: 59 | Admitting: *Deleted

## 2023-03-22 DIAGNOSIS — J309 Allergic rhinitis, unspecified: Secondary | ICD-10-CM | POA: Diagnosis not present

## 2023-04-01 ENCOUNTER — Ambulatory Visit (INDEPENDENT_AMBULATORY_CARE_PROVIDER_SITE_OTHER): Payer: 59

## 2023-04-01 DIAGNOSIS — J309 Allergic rhinitis, unspecified: Secondary | ICD-10-CM | POA: Diagnosis not present

## 2023-04-06 ENCOUNTER — Ambulatory Visit (INDEPENDENT_AMBULATORY_CARE_PROVIDER_SITE_OTHER): Payer: 59

## 2023-04-06 DIAGNOSIS — J309 Allergic rhinitis, unspecified: Secondary | ICD-10-CM

## 2023-04-15 ENCOUNTER — Ambulatory Visit (INDEPENDENT_AMBULATORY_CARE_PROVIDER_SITE_OTHER): Payer: 59

## 2023-04-15 DIAGNOSIS — J309 Allergic rhinitis, unspecified: Secondary | ICD-10-CM | POA: Diagnosis not present

## 2023-04-22 ENCOUNTER — Ambulatory Visit (INDEPENDENT_AMBULATORY_CARE_PROVIDER_SITE_OTHER): Payer: 59

## 2023-04-22 DIAGNOSIS — J309 Allergic rhinitis, unspecified: Secondary | ICD-10-CM

## 2023-05-20 ENCOUNTER — Ambulatory Visit (INDEPENDENT_AMBULATORY_CARE_PROVIDER_SITE_OTHER): Payer: 59

## 2023-05-20 DIAGNOSIS — J309 Allergic rhinitis, unspecified: Secondary | ICD-10-CM

## 2023-05-20 MED ORDER — BREZTRI AEROSPHERE 160-9-4.8 MCG/ACT IN AERO: 2.0000 | INHALATION_SPRAY | Freq: Two times a day (BID) | RESPIRATORY_TRACT | 1 refills | Status: DC

## 2023-05-20 NOTE — Progress Notes (Signed)
Patient came in for her injection and requested a refill for her Brittany Riggs. I have sent it in to the pharmacy on file.

## 2023-06-11 ENCOUNTER — Ambulatory Visit (INDEPENDENT_AMBULATORY_CARE_PROVIDER_SITE_OTHER): Payer: 59 | Admitting: *Deleted

## 2023-06-11 ENCOUNTER — Telehealth: Payer: Self-pay | Admitting: *Deleted

## 2023-06-11 DIAGNOSIS — J309 Allergic rhinitis, unspecified: Secondary | ICD-10-CM

## 2023-06-11 NOTE — Telephone Encounter (Signed)
Suly would like to know if her office visit scheduled for July can be changed to a virtual visit instead of an in person visit.

## 2023-06-14 ENCOUNTER — Encounter: Payer: Self-pay | Admitting: *Deleted

## 2023-06-14 NOTE — Telephone Encounter (Signed)
MyChart message sent with this information. 

## 2023-06-21 ENCOUNTER — Ambulatory Visit (INDEPENDENT_AMBULATORY_CARE_PROVIDER_SITE_OTHER): Payer: Managed Care, Other (non HMO) | Admitting: Allergy

## 2023-06-21 ENCOUNTER — Encounter: Payer: Self-pay | Admitting: Allergy

## 2023-06-21 ENCOUNTER — Other Ambulatory Visit: Payer: Self-pay

## 2023-06-21 VITALS — BP 150/88 | HR 98 | Temp 97.3°F | Ht 67.32 in | Wt 345.0 lb

## 2023-06-21 DIAGNOSIS — J4551 Severe persistent asthma with (acute) exacerbation: Secondary | ICD-10-CM | POA: Diagnosis not present

## 2023-06-21 DIAGNOSIS — K219 Gastro-esophageal reflux disease without esophagitis: Secondary | ICD-10-CM

## 2023-06-21 DIAGNOSIS — T7800XD Anaphylactic reaction due to unspecified food, subsequent encounter: Secondary | ICD-10-CM | POA: Diagnosis not present

## 2023-06-21 DIAGNOSIS — J3089 Other allergic rhinitis: Secondary | ICD-10-CM

## 2023-06-21 MED ORDER — METHYLPREDNISOLONE ACETATE 80 MG/ML IJ SUSP
80.0000 mg | Freq: Once | INTRAMUSCULAR | Status: AC
  Administered 2023-06-21: 80 mg via INTRAMUSCULAR

## 2023-06-21 MED ORDER — AZITHROMYCIN 250 MG PO TABS: ORAL_TABLET | ORAL | 0 refills | Status: DC

## 2023-06-21 NOTE — Assessment & Plan Note (Signed)
Past history - started AIT on 07/02/2016 (G/Weeds/T + M/DM/C/CR). Nasal sprays cause epistaxis. Patient has dry eyes.  Interim history - no issues.  Continue allergy injections - wait until you are feeling better to get it.  Continue environmental control measures.  Continue Singulair (montelukast) 10mg  daily at night. May use over the counter antihistamines such as Zyrtec (cetirizine), Claritin (loratadine), Allegra (fexofenadine), or Xyzal (levocetirizine) daily as needed. May take twice a day during flares.  Start Ryaltris (olopatadine + mometasone nasal spray combination) 1-2 sprays per nostril twice a day. Sample given. This replaces your other nasal sprays. May use nasal saline spray (i.e., Simply Saline) as needed.

## 2023-06-21 NOTE — Assessment & Plan Note (Signed)
Symptoms flaring recently. Famotidine ineffective. Taking pantoprazole 40mg  in the morning and 20mg  at night.  See handout for lifestyle and dietary modifications. Take pantoprazole 40mg  twice a day for the next 2 weeks then go back to once a day. Nothing to eat or drink for 30 min afterwards.

## 2023-06-21 NOTE — Progress Notes (Signed)
Follow Up Note  RE: Brittany Riggs MRN: [id number] DOB: 1971/07/20 Date of Office Visit: 06/21/2023  Referring provider: Laurann Montana, MD Primary care provider: Laurann Montana, MD  Chief Complaint: Follow-up and Cough (C/o coughing, shortness of breath and wheezing x4 weeks.)  History of Present Illness: I had the pleasure of seeing Brittany Riggs for a follow up visit at the [facility] on 06/21/2023. She is a 52 y.o. female, who is being followed for asthma, allergic rhinitis on AIT and food allergy. Her previous allergy office visit was on 03/08/2023 with Dr. Selena Batten. Today is a new complaint visit of trouble breathing . Failed to follow up as recommended.  Asthma  The week of June 16th patient had coughing, shortness of breath. Patient had a televisit and was told it was allergies and asthma. She was given prednisone taper and tessalon perles. She doesn't like prednisone pills as it gives her palpitations, muscle spasms and sweating. She tolerates the steroid injections better.  Last prednisone pill dose was 2 weeks ago. Doing slightly better but still coughing and unable to sleep well at night.   Currently on Breztri 2 puffs BID, Singulair daily. Prefers Clinical cytogeneticist over [employer].  Using albuterol twice a day for the past 1 month with some benefit.   Also started on budesonide nebulizer twice per week.  Coughing throughout the day. No phlegm that's coming out but feels it in her chest. No fevers/chills.  Had negative covid-19 testing at home. No sick contacts.   Patient is taking pantoprazole 40mg  in the morning but recently she has been having issues at night and taking famotidine 20mg  at night which is not helping so she has been taking pantoprazole 20mg  at night which helps.   Seasonal and perennial allergic rhinitis Doing well with AIT with no issues.  Some itchy eyes and using xiidra eye drops.  Takes allegra 180mg  once a day at night and Claritin in the  mornings.   Food allergy Avoiding shellfish and sesame.  Assessment and Plan: Brittany Riggs is a 52 y.o. female with: Severe persistent asthma with acute exacerbation Coughing, shortness breath a few weeks ago. Treated with oral prednisone and tessalon perles with minimal benefit. Likes Breztri over [employer]. No sick contacts, negative Covid-19 testing.  Today's spirometry was normal but she just took albuterol at home.  Depo 80mg  injection given today in the office. If you are not feeling better in a few days then start zpak. We will get bloodwork when are you are feeling better to see if you qualify for a biologic injection for asthma.  Daily controller medication(s):  Continue Breztri 2 puffs twice a day.  Continue Singulair (montelukast) 10mg  daily at night. During respiratory infections/asthma flares:  Start Pulmicort 0.5mg  nebulizer twice a day for 1-2 weeks until your breathing symptoms return to baseline.  Pretreat with albuterol nebulizer.  If you need to use your albuterol nebulizer machine back to back within 15-30 minutes with no relief then please go to the ER/urgent care for further evaluation.  Get spirometry at next visit.  Seasonal and perennial allergic rhinitis Past history - started AIT on 07/02/2016 (G/Weeds/T + M/DM/C/CR). Nasal sprays cause epistaxis. Patient has dry eyes.  Interim history - no issues.  Continue allergy injections - wait until you are feeling better to get it.  Continue environmental control measures.  Continue Singulair (montelukast) 10mg  daily at night. May use over the counter antihistamines such as Zyrtec (cetirizine), Claritin (loratadine), Allegra (fexofenadine), or  Xyzal (levocetirizine) daily as needed. May take twice a day during flares.  Start Ryaltris (olopatadine + mometasone nasal spray combination) 1-2 sprays per nostril twice a day. Sample given. This replaces your other nasal sprays. May use nasal saline spray (i.e., Simply Saline) as  needed.  Anaphylactic reaction due to food, subsequent encounter Past history -  Sesame causes itching. 2021 bloodwork borderline positive to shellfish. Continue to avoid shellfish and sesame. For mild symptoms you can take over the counter antihistamines such as Benadryl and monitor symptoms closely. If symptoms worsen or if you have severe symptoms including breathing issues, throat closure, significant swelling, whole body hives, severe diarrhea and vomiting, lightheadedness then inject epinephrine and seek immediate medical care afterwards.  GERD (gastroesophageal reflux disease) Symptoms flaring recently. Famotidine ineffective. Taking pantoprazole 40mg  in the morning and 20mg  at night.  See handout for lifestyle and dietary modifications. Take pantoprazole 40mg  twice a day for the next 2 weeks then go back to once a day. Nothing to eat or drink for 30 min afterwards.   Return in about 2 months (around 08/22/2023).  Meds ordered this encounter  Medications   azithromycin (ZITHROMAX Z-PAK) 250 MG tablet    Sig: Take 2 pills on day 1, then 1 pill days 2-5.    Dispense:  6 each    Refill:  0   methylPREDNISolone acetate (DEPO-MEDROL) injection 80 mg   Lab Orders  No laboratory test(s) ordered today    Diagnostics: Spirometry:  Tracings reviewed. Her effort: Good reproducible efforts. FVC: 2.60L FEV1: 2.17L, 84% predicted FEV1/FVC ratio: 83% Interpretation: No overt abnormalities noted given today's efforts.  Just used albuterol 2 puffs prior to the appointment.  Please see scanned spirometry results for details.  Medication List:  Current Outpatient Medications  Medication Sig Dispense Refill   albuterol (PROVENTIL) (2.5 MG/3ML) 0.083% nebulizer solution Take 3 mLs (2.5 mg total) by nebulization every 4 (four) hours as needed for wheezing or shortness of breath (coughing fits). 150 mL 1   albuterol (VENTOLIN HFA) 108 (90 Base) MCG/ACT inhaler Inhale 2 puffs into the lungs  every 4 (four) hours as needed for wheezing or shortness of breath (coughing fits). 18 g 1   albuterol (VENTOLIN HFA) 108 (90 Base) MCG/ACT inhaler Inhale 2 puffs into the lungs every 4 (four) hours as needed for wheezing or shortness of breath (coughing fits). 18 g 1   Ascorbic Acid (VITAMIN C) 1000 MG tablet 1 tablet     azithromycin (ZITHROMAX Z-PAK) 250 MG tablet Take 2 pills on day 1, then 1 pill days 2-5. 6 each 0   Beclomethasone Dipropionate (QNASL) 80 MCG/ACT AERS 2 sprays     Budeson-Glycopyrrol-Formoterol (BREZTRI AEROSPHERE) 160-9-4.8 MCG/ACT AERO Inhale 2 puffs into the lungs in the morning and at bedtime. 11 g 1   budesonide (PULMICORT) 0.5 MG/2ML nebulizer solution Take 2 mLs (0.5 mg total) by nebulization in the morning and at bedtime. 120 mL 1   carvedilol (COREG) 3.125 MG tablet Take 3.125 mg by mouth 2 (two) times daily with a meal.      chlorthalidone (HYGROTON) 25 MG tablet 1 tablet in the morning with food     Cholecalciferol (VITAMIN D3) 5000 units TABS Take 5,000 Units by mouth daily.      EPINEPHrine (AUVI-Q) 0.3 mg/0.3 mL IJ SOAJ injection Inject 0.3 mg into the muscle as needed for anaphylaxis. 1 each 1   EPINEPHrine 0.3 mg/0.3 mL IJ SOAJ injection Inject 0.3 mg into the muscle as needed  for anaphylaxis. 2 each 1   Eszopiclone 3 MG TABS Take 3 mg by mouth at bedtime as needed (sleep). Take immediately before bedtime     hydrocortisone (ANUSOL-HC) 2.5 % rectal cream 1 application to affected area     hydroxychloroquine (PLAQUENIL) 200 MG tablet Take 200 mg by mouth daily. 2 tabs daily     hydroxychloroquine (PLAQUENIL) 200 MG tablet See admin instructions.     irbesartan (AVAPRO) 300 MG tablet 1 tablet     JOLESSA 0.15-0.03 MG tablet Take 1 tablet by mouth daily.     levonorgestrel-ethinyl estradiol (SEASONALE) 0.15-0.03 MG tablet 1 tablet     Lifitegrast 5 % SOLN Place 1 drop into both eyes 2 (two) times daily.     loperamide (IMODIUM) 2 MG capsule Take 1 capsule (2 mg  total) by mouth 4 (four) times daily as needed for diarrhea or loose stools. 20 capsule 0   loratadine (CLARITIN) 10 MG tablet Take 10 mg by mouth in the morning. Reported on 06/23/2016     metaxalone (SKELAXIN) 800 MG tablet 1 tablet     metroNIDAZOLE (METROCREAM) 0.75 % cream Apply 1 application topically See admin instructions.      montelukast (SINGULAIR) 10 MG tablet Take 1 tablet (10 mg total) by mouth at bedtime. 90 tablet 1   naratriptan (AMERGE) 2.5 MG tablet Take 2.5 mg by mouth as needed for migraine.   5   NURTEC 75 MG TBDP Take 1 tablet by mouth daily as needed.     ondansetron (ZOFRAN ODT) 4 MG disintegrating tablet Take 1 tablet (4 mg total) by mouth every 8 (eight) hours as needed for nausea or vomiting. 21 tablet 0   pantoprazole (PROTONIX) 40 MG tablet Take 40 mg by mouth daily.     promethazine-dextromethorphan (PROMETHAZINE-DM) 6.25-15 MG/5ML syrup 5 ml as needed     Respiratory Therapy Supplies (FLUTTER) DEVI Use as directed 1 each 0   Rimegepant Sulfate (NURTEC) 75 MG TBDP      tiZANidine (ZANAFLEX) 2 MG tablet Take 1 tablet (2 mg total) by mouth at bedtime as needed for muscle spasms. 30 tablet 0   traMADol (ULTRAM) 50 MG tablet 1-2 every 4 hours as needed for cough or pain (Patient taking differently: Take 50-100 mg by mouth every 4 (four) hours as needed (pain). 1-2 every 4 hours as needed for cough or pain) 40 tablet 0   valACYclovir (VALTREX) 500 MG tablet Take 500 mg by mouth daily.     venlafaxine XR (EFFEXOR-XR) 150 MG 24 hr capsule Take 150 mg by mouth 2 (two) times daily with a meal.      Vitamin D, Ergocalciferol, 50000 units CAPS 1 capsule     No current facility-administered medications for this visit.   Allergies: Allergies  Allergen Reactions   Amoxicillin Hives   Codeine Itching   Penicillins Hives    Has patient had a PCN reaction causing immediate rash, facial/tongue/throat swelling, SOB or lightheadedness with hypotension: Yes Has patient had a PCN  reaction causing severe rash involving mucus membranes or skin necrosis: no Has patient had a PCN reaction that required hospitalization: no Has patient had a PCN reaction occurring within the last 10 years: no If all of the above answers are "NO", then may proceed with Cephalosporin use.    Prednisone Other (See Comments)    Severe cramps and "spasms"   Sulfa Antibiotics Other (See Comments)    Does not remember side effects   Latex Rash and  Other (See Comments)    "Sometimes bothers me"   I reviewed her past medical history, social history, family history, and environmental history and no significant changes have been reported from her previous visit.  Review of Systems  Constitutional:  Negative for appetite change, chills, fever and unexpected weight change.  HENT:  Negative for congestion, rhinorrhea and sneezing.   Eyes:  Negative for itching.  Respiratory:  Positive for cough, chest tightness and shortness of breath. Negative for wheezing.   Gastrointestinal:  Negative for abdominal pain.  Skin:  Negative for rash.  Allergic/Immunologic: Positive for environmental allergies and food allergies.  Neurological:  Negative for headaches.    Objective: BP (!) 150/88   Pulse 98   Temp (!) 97.3 F (36.3 C)   Ht 5' 7.32" (1.71 m)   Wt (!) 345 lb (156.5 kg)   SpO2 100%   BMI 53.52 kg/m  Body mass index is 53.52 kg/m. Physical Exam Vitals and nursing note reviewed.  Constitutional:      Appearance: Normal appearance. She is well-developed. She is obese.  HENT:     Head: Normocephalic and atraumatic.     Right Ear: Tympanic membrane and external ear normal.     Left Ear: Tympanic membrane and external ear normal.     Nose: Nose normal.     Mouth/Throat:     Mouth: Mucous membranes are moist.     Pharynx: Oropharynx is clear.  Eyes:     Conjunctiva/sclera: Conjunctivae normal.  Cardiovascular:     Rate and Rhythm: Normal rate and regular rhythm.     Heart sounds: Normal  heart sounds. No murmur heard. Pulmonary:     Effort: Pulmonary effort is normal.     Breath sounds: Normal breath sounds. No wheezing, rhonchi or rales.     Comments: Dry coughing during visit. Musculoskeletal:     Cervical back: Neck supple.  Skin:    General: Skin is warm.     Findings: No rash.  Neurological:     Mental Status: She is alert and oriented to person, place, and time.  Psychiatric:        Behavior: Behavior normal.   Previous notes and tests were reviewed. The plan was reviewed with the patient/family, and all questions/concerned were addressed.  It was my pleasure to see Brittany Riggs today and participate in her care. Please feel free to contact me with any questions or concerns.  Sincerely,  Wyline Mood, DO Allergy & Immunology  [facility] office: [phone] [facility] office: [phone]

## 2023-06-21 NOTE — Patient Instructions (Addendum)
Asthma:  Depo 80mg  injection given today in the office. If you are not feeling better in a few days then start zpak. We will get bloodwork when are you are feeling better to see if you qualify for a biologic injection for asthma.   Daily controller medication(s):  Continue Breztri 2 puffs twice a day.  Continue Singulair (montelukast) 10mg  daily at night. During respiratory infections/asthma flares:  Start Pulmicort 0.5mg  nebulizer twice a day for 1-2 weeks until your breathing symptoms return to baseline.  Pretreat with albuterol nebulizer.  If you need to use your albuterol nebulizer machine back to back within 15-30 minutes with no relief then please go to the ER/urgent care for further evaluation.  Asthma control goals:  Full participation in all desired activities (may need albuterol before activity) Albuterol use two times or less a week on average (not counting use with activity) Cough interfering with sleep two times or less a month Oral steroids no more than once a year No hospitalizations   Seasonal and perennial allergic rhinitis Continue allergy injections - wait until you are feeling better to get it.  Continue environmental control measures.  Continue Singulair (montelukast) 10mg  daily at night. May use over the counter antihistamines such as Zyrtec (cetirizine), Claritin (loratadine), Allegra (fexofenadine), or Xyzal (levocetirizine) daily as needed. May take twice a day during flares.  Start Ryaltris (olopatadine + mometasone nasal spray combination) 1-2 sprays per nostril twice a day. Sample given. This replaces your other nasal sprays. May use nasal saline spray (i.e., Simply Saline) as needed.   Food allergy Continue to avoid shellfish and sesame. For mild symptoms you can take over the counter antihistamines such as Benadryl and monitor symptoms closely. If symptoms worsen or if you have severe symptoms including breathing issues, throat closure, significant  swelling, whole body hives, severe diarrhea and vomiting, lightheadedness then inject epinephrine and seek immediate medical care afterwards.  Heartburn: See handout for lifestyle and dietary modifications. Take pantoprazole 40mg  twice a day for the next 2 weeks then go back to once a day. Nothing to eat or drink for 30 min afterwards.   Follow up in 2 months or sooner if needed.

## 2023-06-21 NOTE — Assessment & Plan Note (Signed)
Coughing, shortness breath a few weeks ago. Treated with oral prednisone and tessalon perles with minimal benefit. Likes Breztri over [employer]. No sick contacts, negative Covid-19 testing.  Today's spirometry was normal but she just took albuterol at home.  Depo 80mg  injection given today in the office. If you are not feeling better in a few days then start zpak. We will get bloodwork when are you are feeling better to see if you qualify for a biologic injection for asthma.  Daily controller medication(s):  Continue Breztri 2 puffs twice a day.  Continue Singulair (montelukast) 10mg  daily at night. During respiratory infections/asthma flares:  Start Pulmicort 0.5mg  nebulizer twice a day for 1-2 weeks until your breathing symptoms return to baseline.  Pretreat with albuterol nebulizer.  If you need to use your albuterol nebulizer machine back to back within 15-30 minutes with no relief then please go to the ER/urgent care for further evaluation.  Get spirometry at next visit.

## 2023-06-21 NOTE — Assessment & Plan Note (Signed)
Past history -  Sesame causes itching. 2021 bloodwork borderline positive to shellfish. ?? Continue to avoid shellfish and sesame. ?? For mild symptoms you can take over the counter antihistamines such as Benadryl and monitor symptoms closely. If symptoms worsen or if you have severe symptoms including breathing issues, throat closure, significant swelling, whole body hives, severe diarrhea and vomiting, lightheadedness then inject epinephrine and seek immediate medical care afterwards. ?

## 2023-07-14 ENCOUNTER — Encounter: Payer: Self-pay | Admitting: Allergy

## 2023-07-14 ENCOUNTER — Ambulatory Visit (INDEPENDENT_AMBULATORY_CARE_PROVIDER_SITE_OTHER): Payer: Managed Care, Other (non HMO)

## 2023-07-14 DIAGNOSIS — J309 Allergic rhinitis, unspecified: Secondary | ICD-10-CM

## 2023-07-15 MED ORDER — ALBUTEROL SULFATE (2.5 MG/3ML) 0.083% IN NEBU: 2.5000 mg | INHALATION_SOLUTION | RESPIRATORY_TRACT | 1 refills | Status: AC | PRN

## 2023-07-15 NOTE — Telephone Encounter (Signed)
Lvm for patient letting her know that the refill has been sent to the requested pharmacy.

## 2023-08-13 ENCOUNTER — Ambulatory Visit (INDEPENDENT_AMBULATORY_CARE_PROVIDER_SITE_OTHER): Payer: Managed Care, Other (non HMO)

## 2023-08-13 DIAGNOSIS — J309 Allergic rhinitis, unspecified: Secondary | ICD-10-CM | POA: Diagnosis not present

## 2023-08-24 NOTE — Progress Notes (Unsigned)
Follow Up Note  RE: Brittany Riggs MRN: [id number] DOB: 1971-06-20 Date of Office Visit: 08/25/2023  Referring provider: Laurann Montana, MD Primary care provider: Laurann Montana, MD  Chief Complaint: No chief complaint on file.  History of Present Illness: I had the pleasure of seeing Brittany Riggs for a follow up visit at the [facility] on 08/24/2023. She is a 52 y.o. female, who is being followed for asthma, allergic rhinitis on AIT, food allergy and GERD. Her previous allergy office visit was on 06/21/2023 with Dr. Selena Batten. Today is a regular follow up visit.  Severe persistent asthma with acute exacerbation Coughing, shortness breath a few weeks ago. Treated with oral prednisone and tessalon perles with minimal benefit. Likes Breztri over [employer]. No sick contacts, negative Covid-19 testing.  Today's spirometry was normal but she just took albuterol at home.  Depo 80mg  injection given today in the office. If you are not feeling better in a few days then start zpak. We will get bloodwork when are you are feeling better to see if you qualify for a biologic injection for asthma.  Daily controller medication(s):  Continue Breztri 2 puffs twice a day.  Continue Singulair (montelukast) 10mg  daily at night. During respiratory infections/asthma flares:  Start Pulmicort 0.5mg  nebulizer twice a day for 1-2 weeks until your breathing symptoms return to baseline.  Pretreat with albuterol nebulizer.  If you need to use your albuterol nebulizer machine back to back within 15-30 minutes with no relief then please go to the ER/urgent care for further evaluation.  Get spirometry at next visit.   Seasonal and perennial allergic rhinitis Past history - started AIT on 07/02/2016 (G/Weeds/T + M/DM/C/CR). Nasal sprays cause epistaxis. Patient has dry eyes.  Interim history - no issues.  Continue allergy injections - wait until you are feeling better to get it.  Continue  environmental control measures.  Continue Singulair (montelukast) 10mg  daily at night. May use over the counter antihistamines such as Zyrtec (cetirizine), Claritin (loratadine), Allegra (fexofenadine), or Xyzal (levocetirizine) daily as needed. May take twice a day during flares.  Start Ryaltris (olopatadine + mometasone nasal spray combination) 1-2 sprays per nostril twice a day. Sample given. This replaces your other nasal sprays. May use nasal saline spray (i.e., Simply Saline) as needed.   Anaphylactic reaction due to food, subsequent encounter Past history -  Sesame causes itching. 2021 bloodwork borderline positive to shellfish. Continue to avoid shellfish and sesame. For mild symptoms you can take over the counter antihistamines such as Benadryl and monitor symptoms closely. If symptoms worsen or if you have severe symptoms including breathing issues, throat closure, significant swelling, whole body hives, severe diarrhea and vomiting, lightheadedness then inject epinephrine and seek immediate medical care afterwards.   GERD (gastroesophageal reflux disease) Symptoms flaring recently. Famotidine ineffective. Taking pantoprazole 40mg  in the morning and 20mg  at night.  See handout for lifestyle and dietary modifications. Take pantoprazole 40mg  twice a day for the next 2 weeks then go back to once a day. Nothing to eat or drink for 30 min afterwards.    Return in about 2 months (around 08/22/2023).  Assessment and Plan: [name] is a 52 y.o. female with: ***  No follow-ups on file.  No orders of the defined types were placed in this encounter.  Lab Orders  No laboratory test(s) ordered today    Diagnostics: Spirometry:  Tracings reviewed. Her effort: {Blank single:19197::"Good reproducible efforts.","It was hard to get consistent efforts and there is  a question as to whether this reflects a maximal maneuver.","Poor effort, data can not be interpreted."} FVC: ***L FEV1: ***L, ***%  predicted FEV1/FVC ratio: ***% Interpretation: {Blank single:19197::"Spirometry consistent with mild obstructive disease","Spirometry consistent with moderate obstructive disease","Spirometry consistent with severe obstructive disease","Spirometry consistent with possible restrictive disease","Spirometry consistent with mixed obstructive and restrictive disease","Spirometry uninterpretable due to technique","Spirometry consistent with normal pattern","No overt abnormalities noted given today's efforts"}.  Please see scanned spirometry results for details.  Skin Testing: {Blank single:19197::"Select foods","Environmental allergy panel","Environmental allergy panel and select foods","Food allergy panel","None","Deferred due to recent antihistamines use"}. *** Results discussed with patient/family.   Medication List:  Current Outpatient Medications  Medication Sig Dispense Refill   albuterol (PROVENTIL) (2.5 MG/3ML) 0.083% nebulizer solution Take 3 mLs (2.5 mg total) by nebulization every 4 (four) hours as needed for wheezing or shortness of breath (coughing fits). 150 mL 1   albuterol (VENTOLIN HFA) 108 (90 Base) MCG/ACT inhaler Inhale 2 puffs into the lungs every 4 (four) hours as needed for wheezing or shortness of breath (coughing fits). 18 g 1   albuterol (VENTOLIN HFA) 108 (90 Base) MCG/ACT inhaler Inhale 2 puffs into the lungs every 4 (four) hours as needed for wheezing or shortness of breath (coughing fits). 18 g 1   Ascorbic Acid (VITAMIN C) 1000 MG tablet 1 tablet     azithromycin (ZITHROMAX Z-PAK) 250 MG tablet Take 2 pills on day 1, then 1 pill days 2-5. 6 each 0   Beclomethasone Dipropionate (QNASL) 80 MCG/ACT AERS 2 sprays     Budeson-Glycopyrrol-Formoterol (BREZTRI AEROSPHERE) 160-9-4.8 MCG/ACT AERO Inhale 2 puffs into the lungs in the morning and at bedtime. 11 g 1   budesonide (PULMICORT) 0.5 MG/2ML nebulizer solution Take 2 mLs (0.5 mg total) by nebulization in the morning and at  bedtime. 120 mL 1   carvedilol (COREG) 3.125 MG tablet Take 3.125 mg by mouth 2 (two) times daily with a meal.      chlorthalidone (HYGROTON) 25 MG tablet 1 tablet in the morning with food     Cholecalciferol (VITAMIN D3) 5000 units TABS Take 5,000 Units by mouth daily.      EPINEPHrine (AUVI-Q) 0.3 mg/0.3 mL IJ SOAJ injection Inject 0.3 mg into the muscle as needed for anaphylaxis. 1 each 1   EPINEPHrine 0.3 mg/0.3 mL IJ SOAJ injection Inject 0.3 mg into the muscle as needed for anaphylaxis. 2 each 1   Eszopiclone 3 MG TABS Take 3 mg by mouth at bedtime as needed (sleep). Take immediately before bedtime     hydrocortisone (ANUSOL-HC) 2.5 % rectal cream 1 application to affected area     hydroxychloroquine (PLAQUENIL) 200 MG tablet Take 200 mg by mouth daily. 2 tabs daily     hydroxychloroquine (PLAQUENIL) 200 MG tablet See admin instructions.     irbesartan (AVAPRO) 300 MG tablet 1 tablet     JOLESSA 0.15-0.03 MG tablet Take 1 tablet by mouth daily.     levonorgestrel-ethinyl estradiol (SEASONALE) 0.15-0.03 MG tablet 1 tablet     Lifitegrast 5 % SOLN Place 1 drop into both eyes 2 (two) times daily.     loperamide (IMODIUM) 2 MG capsule Take 1 capsule (2 mg total) by mouth 4 (four) times daily as needed for diarrhea or loose stools. 20 capsule 0   loratadine (CLARITIN) 10 MG tablet Take 10 mg by mouth in the morning. Reported on 06/23/2016     metaxalone (SKELAXIN) 800 MG tablet 1 tablet     metroNIDAZOLE (METROCREAM) 0.75 %  cream Apply 1 application topically See admin instructions.      montelukast (SINGULAIR) 10 MG tablet Take 1 tablet (10 mg total) by mouth at bedtime. 90 tablet 1   naratriptan (AMERGE) 2.5 MG tablet Take 2.5 mg by mouth as needed for migraine.   5   NURTEC 75 MG TBDP Take 1 tablet by mouth daily as needed.     ondansetron (ZOFRAN ODT) 4 MG disintegrating tablet Take 1 tablet (4 mg total) by mouth every 8 (eight) hours as needed for nausea or vomiting. 21 tablet 0    pantoprazole (PROTONIX) 40 MG tablet Take 40 mg by mouth daily.     promethazine-dextromethorphan (PROMETHAZINE-DM) 6.25-15 MG/5ML syrup 5 ml as needed     Respiratory Therapy Supplies (FLUTTER) DEVI Use as directed 1 each 0   Rimegepant Sulfate (NURTEC) 75 MG TBDP      tiZANidine (ZANAFLEX) 2 MG tablet Take 1 tablet (2 mg total) by mouth at bedtime as needed for muscle spasms. 30 tablet 0   traMADol (ULTRAM) 50 MG tablet 1-2 every 4 hours as needed for cough or pain (Patient taking differently: Take 50-100 mg by mouth every 4 (four) hours as needed (pain). 1-2 every 4 hours as needed for cough or pain) 40 tablet 0   valACYclovir (VALTREX) 500 MG tablet Take 500 mg by mouth daily.     venlafaxine XR (EFFEXOR-XR) 150 MG 24 hr capsule Take 150 mg by mouth 2 (two) times daily with a meal.      Vitamin D, Ergocalciferol, 50000 units CAPS 1 capsule     No current facility-administered medications for this visit.   Allergies: Allergies  Allergen Reactions   Amoxicillin Hives   Codeine Itching   Penicillins Hives    Has patient had a PCN reaction causing immediate rash, facial/tongue/throat swelling, SOB or lightheadedness with hypotension: Yes Has patient had a PCN reaction causing severe rash involving mucus membranes or skin necrosis: no Has patient had a PCN reaction that required hospitalization: no Has patient had a PCN reaction occurring within the last 10 years: no If all of the above answers are "NO", then may proceed with Cephalosporin use.    Prednisone Other (See Comments)    Severe cramps and "spasms"   Sulfa Antibiotics Other (See Comments)    Does not remember side effects   Latex Rash and Other (See Comments)    "Sometimes bothers me"   I reviewed her past medical history, social history, family history, and environmental history and no significant changes have been reported from her previous visit.  Review of Systems  Constitutional:  Negative for appetite change, chills,  fever and unexpected weight change.  HENT:  Negative for congestion, rhinorrhea and sneezing.   Eyes:  Negative for itching.  Respiratory:  Positive for cough, chest tightness and shortness of breath. Negative for wheezing.   Gastrointestinal:  Negative for abdominal pain.  Skin:  Negative for rash.  Allergic/Immunologic: Positive for environmental allergies and food allergies.  Neurological:  Negative for headaches.    Objective: There were no vitals taken for this visit. There is no height or weight on file to calculate BMI. Physical Exam Vitals and nursing note reviewed.  Constitutional:      Appearance: Normal appearance. She is well-developed. She is obese.  HENT:     Head: Normocephalic and atraumatic.     Right Ear: Tympanic membrane and external ear normal.     Left Ear: Tympanic membrane and external ear normal.  Nose: Nose normal.     Mouth/Throat:     Mouth: Mucous membranes are moist.     Pharynx: Oropharynx is clear.  Eyes:     Conjunctiva/sclera: Conjunctivae normal.  Cardiovascular:     Rate and Rhythm: Normal rate and regular rhythm.     Heart sounds: Normal heart sounds. No murmur heard. Pulmonary:     Effort: Pulmonary effort is normal.     Breath sounds: Normal breath sounds. No wheezing, rhonchi or rales.     Comments: Dry coughing during visit. Musculoskeletal:     Cervical back: Neck supple.  Skin:    General: Skin is warm.     Findings: No rash.  Neurological:     Mental Status: She is alert and oriented to person, place, and time.  Psychiatric:        Behavior: Behavior normal.    Previous notes and tests were reviewed. The plan was reviewed with the patient/family, and all questions/concerned were addressed.  It was my pleasure to see Sadi today and participate in her care. Please feel free to contact me with any questions or concerns.  Sincerely,  Wyline Mood, DO Allergy & Immunology  [facility] office: [phone] [facility] office: [phone]

## 2023-08-25 ENCOUNTER — Ambulatory Visit (INDEPENDENT_AMBULATORY_CARE_PROVIDER_SITE_OTHER): Payer: Managed Care, Other (non HMO) | Admitting: Allergy

## 2023-08-25 ENCOUNTER — Ambulatory Visit
Admission: RE | Admit: 2023-08-25 | Discharge: 2023-08-25 | Disposition: A | Discharge status: Home / Self Care | Payer: 59 | Source: Ambulatory Visit | Attending: Allergy

## 2023-08-25 ENCOUNTER — Encounter: Payer: Self-pay | Admitting: Allergy

## 2023-08-25 ENCOUNTER — Telehealth: Payer: Self-pay | Admitting: Allergy

## 2023-08-25 ENCOUNTER — Other Ambulatory Visit: Payer: Self-pay

## 2023-08-25 VITALS — BP 134/88 | HR 118 | Temp 96.8°F | Resp 14

## 2023-08-25 DIAGNOSIS — J3089 Other allergic rhinitis: Secondary | ICD-10-CM | POA: Diagnosis not present

## 2023-08-25 DIAGNOSIS — Z91038 Other insect allergy status: Secondary | ICD-10-CM

## 2023-08-25 DIAGNOSIS — J4550 Severe persistent asthma, uncomplicated: Secondary | ICD-10-CM

## 2023-08-25 DIAGNOSIS — J3081 Allergic rhinitis due to animal (cat) (dog) hair and dander: Secondary | ICD-10-CM | POA: Diagnosis not present

## 2023-08-25 DIAGNOSIS — J301 Allergic rhinitis due to pollen: Secondary | ICD-10-CM | POA: Diagnosis not present

## 2023-08-25 DIAGNOSIS — R053 Chronic cough: Secondary | ICD-10-CM

## 2023-08-25 DIAGNOSIS — T7800XD Anaphylactic reaction due to unspecified food, subsequent encounter: Secondary | ICD-10-CM

## 2023-08-25 DIAGNOSIS — K219 Gastro-esophageal reflux disease without esophagitis: Secondary | ICD-10-CM

## 2023-08-25 DIAGNOSIS — J4551 Severe persistent asthma with (acute) exacerbation: Secondary | ICD-10-CM

## 2023-08-25 MED ORDER — METHYLPREDNISOLONE 4 MG PO TBPK: ORAL_TABLET | ORAL | 0 refills | Status: DC

## 2023-08-25 MED ORDER — DEXTROMETHORPHAN HBR 15 MG/5ML PO SYRP: 10.0000 mL | ORAL_SOLUTION | Freq: Four times a day (QID) | ORAL | 1 refills | Status: AC | PRN

## 2023-08-25 MED ORDER — CARBINOXAMINE MALEATE 4 MG PO TABS: 6.0000 mg | ORAL_TABLET | Freq: Two times a day (BID) | ORAL | 2 refills | Status: DC

## 2023-08-25 NOTE — Telephone Encounter (Signed)
I called the patient and left a message for her to call the [place] office back to go over previous note by Dr. Selena Batten

## 2023-08-25 NOTE — Progress Notes (Signed)
Normal cxr

## 2023-08-25 NOTE — Telephone Encounter (Signed)
Please call patient and let her know that the CXR was normal. No need for antibiotics.   Does she want to take a medrol steroid taper?  I know she doesn't like prednisone but maybe she can tolerate medrol pak?  Thank you.

## 2023-08-25 NOTE — Patient Instructions (Addendum)
Asthma:  Get bloodwork. Get chest X-ray.  Take honey for coughing - to soothe and coat the throat. Take dextromethorphan 10mL four times a day as needed for coughing. Stop mucinex.   Daily controller medication(s):  Continue Breztri 2 puffs twice a day with spacer and rinse mouth afterwards. Continue Singulair (montelukast) 10mg  daily at night. During respiratory infections/asthma flares:  Start Pulmicort 0.5mg  nebulizer twice a day for 1-2 weeks until your breathing symptoms return to baseline.  Pretreat with albuterol nebulizer.  If you need to use your albuterol nebulizer machine back to back within 15-30 minutes with no relief then please go to the ER/urgent care for further evaluation.  Asthma control goals:  Full participation in all desired activities (may need albuterol before activity) Albuterol use two times or less a week on average (not counting use with activity) Cough interfering with sleep two times or less a month Oral steroids no more than once a year No hospitalizations   Seasonal and perennial allergic rhinitis Continue allergy injections - wait until you are feeling better to get it.  Continue environmental control measures.  Continue Singulair (montelukast) 10mg  daily at night. Stop Claritin and allegra. Take carbinoxamine 6mg  twice a day.  May use nasal saline spray (i.e., Simply Saline) as needed.   Food allergy Continue to avoid shellfish and sesame. For mild symptoms you can take over the counter antihistamines such as Benadryl and monitor symptoms closely. If symptoms worsen or if you have severe symptoms including breathing issues, throat closure, significant swelling, whole body hives, severe diarrhea and vomiting, lightheadedness then inject epinephrine and seek immediate medical care afterwards.  Heartburn Continue lifestyle and dietary modifications. Take pantoprazole 40mg  twice a day. Nothing to eat or drink for 30 min afterwards.   Follow up in 1  month or sooner if needed.   Drink plenty of fluids. Water, juice, clear broth or warm lemon water are good choices. Avoid caffeine and alcohol, which can dehydrate you. Eat chicken soup. Chicken soup and other warm fluids can be soothing and loosen congestion. Rest. Adjust your room's temperature and humidity. Keep your room warm but not overheated. If the air is dry, a cool-mist humidifier or vaporizer can moisten the air and help ease congestion and coughing. Keep the humidifier clean to prevent the growth of bacteria and molds. Soothe your throat. Perform a saltwater gargle. Dissolve one-quarter to a half teaspoon of salt in a 4- to 8-ounce glass of warm water. This can relieve a sore or scratchy throat temporarily. Use saline nasal drops. To help relieve nasal congestion, try saline nasal drops. You can buy these drops over the counter, and they can help relieve symptoms ? even in children. Take over-the-counter cold and cough medications. For adults and children older than 5, over-the-counter decongestants, antihistamines and pain relievers might offer some symptom relief. However, they won't prevent a cold or shorten its duration.

## 2023-08-27 NOTE — Telephone Encounter (Signed)
Called and left a message for patient to call our office back to review the note from Dr. Selena Batten.

## 2023-08-28 LAB — CBC WITH DIFFERENTIAL/PLATELET
Basophils Absolute: 0 10*3/uL (ref 0.0–0.2)
Basos: 0 %
EOS (ABSOLUTE): 0 10*3/uL (ref 0.0–0.4)
Eos: 0 %
Hematocrit: 38.1 % (ref 34.0–46.6)
Hemoglobin: 12.7 g/dL (ref 11.1–15.9)
Immature Grans (Abs): 0 10*3/uL (ref 0.0–0.1)
Immature Granulocytes: 0 %
Lymphocytes Absolute: 1.4 10*3/uL (ref 0.7–3.1)
Lymphs: 20 %
MCH: 34.5 pg — ABNORMAL HIGH (ref 26.6–33.0)
MCHC: 33.3 g/dL (ref 31.5–35.7)
MCV: 104 fL — ABNORMAL HIGH (ref 79–97)
Monocytes Absolute: 0.6 10*3/uL (ref 0.1–0.9)
Monocytes: 9 %
Neutrophils Absolute: 5 10*3/uL (ref 1.4–7.0)
Neutrophils: 71 %
Platelets: 333 10*3/uL (ref 150–450)
RBC: 3.68 x10E6/uL — ABNORMAL LOW (ref 3.77–5.28)
RDW: 12.4 % (ref 11.7–15.4)
WBC: 7.1 10*3/uL (ref 3.4–10.8)

## 2023-08-28 LAB — IGE: IgE (Immunoglobulin E), Serum: 274 [IU]/mL (ref 6–495)

## 2023-08-30 ENCOUNTER — Encounter: Payer: Self-pay | Admitting: Allergy

## 2023-08-30 NOTE — Telephone Encounter (Signed)
Patient was able to speak to another nurse about results and mediation was sent in the day of her appointment. I called the patient to follow up and see if the medication helped and if she is feeling better since her last visit. I left a message for her to call the [place] office.

## 2023-09-01 ENCOUNTER — Telehealth: Payer: Self-pay | Admitting: *Deleted

## 2023-09-01 NOTE — Telephone Encounter (Signed)
-----   Message from Ellamae Sia sent at 08/30/2023  5:17 PM EDT ----- Babette Relic,  Can you start PA for Tezspire or Xolair 375mg  every 2 weeks for asthma?  Thank you. ----- Message ----- From: Nell Range Lab Results In Sent: 08/26/2023   7:38 AM EDT To: Ellamae Sia, DO

## 2023-09-01 NOTE — Telephone Encounter (Signed)
L./m for patient to contact me to advise approval, copay card and submit to [facility] for [employer]

## 2023-09-07 ENCOUNTER — Other Ambulatory Visit: Payer: Self-pay | Admitting: Family Medicine

## 2023-09-07 ENCOUNTER — Encounter: Payer: Self-pay | Admitting: Allergy

## 2023-09-07 DIAGNOSIS — Z0000 Encounter for general adult medical examination without abnormal findings: Secondary | ICD-10-CM

## 2023-09-07 MED ORDER — TEZSPIRE 210 MG/1.91ML ~~LOC~~ SOAJ: 210.0000 mg | SUBCUTANEOUS | 11 refills | Status: DC

## 2023-09-07 NOTE — Addendum Note (Signed)
Addended by: Devoria Glassing on: 09/07/2023 04:53 PM   Modules accepted: Orders

## 2023-09-07 NOTE — Telephone Encounter (Signed)
Patient returned call and was advised of approval and submit to [employer] for Tezspire with instructions on delivery, storage, dosing and initial injection in clinic with appt

## 2023-09-21 ENCOUNTER — Ambulatory Visit: Payer: Managed Care, Other (non HMO)

## 2023-09-21 NOTE — Progress Notes (Deleted)
Follow Up Note  RE: [name] MRN: [id number] DOB: 1971/11/05 Date of Office Visit: 09/22/2023  Referring provider: Laurann Montana, MD Primary care provider: Laurann Montana, MD  Chief Complaint: No chief complaint on file.  History of Present Illness: I had the pleasure of seeing Brittany Riggs for a follow up visit at the [facility] on 09/21/2023. She is a 52 y.o. female, who is being followed for asthma, chronic cough, allergic rhinitis, food allergy and GERD. Her previous allergy office visit was on 08/25/2023 with Dr. Selena Batten. Today is a regular follow up visit.  Discussed the use of AI scribe software for clinical note transcription with the patient, who gave verbal consent to proceed.  History of Present Illness          08/25/2023 CXR: "IMPRESSION: No active cardiopulmonary disease."  Not well controlled severe persistent asthma Chronic cough Slight improvement after antibiotics and steroid but still having daily cough with clear phlegm. Get bloodwork. Get chest X-ray - normal.  Take honey for coughing - to soothe and coat the throat. Take dextromethorphan 10mL four times a day as needed for coughing. Stop mucinex.  Medrol pak? Daily controller medication(s):  Continue Breztri 2 puffs twice a day with spacer and rinse mouth afterwards. Continue Singulair (montelukast) 10mg  daily at night. During respiratory infections/asthma flares:  Start Pulmicort 0.5mg  nebulizer twice a day for 1-2 weeks until your breathing symptoms return to baseline.  Pretreat with albuterol nebulizer.  If you need to use your albuterol nebulizer machine back to back within 15-30 minutes with no relief then please go to the ER/urgent care for further evaluation.  Get spirometry at next visit.   Seasonal allergic rhinitis due to pollen Allergic rhinitis due to animal dander Allergic rhinitis due to dust mite Allergic rhinitis due to mold Allergy to cockroaches Past  history - started AIT on 07/02/2016 (G/Weeds/T + M/DM/C/CR). Nasal sprays cause epistaxis. Patient has dry eyes.  Interim history - still having drainage.  Continue allergy injections - wait until you are feeling better to get it.  Continue environmental control measures.  Continue Singulair (montelukast) 10mg  daily at night. Stop Claritin and allegra. Take carbinoxamine 6mg  twice a day.  May use nasal saline spray (i.e., Simply Saline) as needed.   Anaphylactic reaction due to food, subsequent encounter Past history - Sesame causes itching. 2021 bloodwork borderline positive to shellfish.  Continue to avoid shellfish and sesame. For mild symptoms you can take over the counter antihistamines such as Benadryl and monitor symptoms closely. If symptoms worsen or if you have severe symptoms including breathing issues, throat closure, significant swelling, whole body hives, severe diarrhea and vomiting, lightheadedness then inject epinephrine and seek immediate medical care afterwards.   Gastroesophageal reflux disease Past history - famotidine ineffective.  Continue lifestyle and dietary modifications. Take pantoprazole 40mg  twice a day. Nothing to eat or drink for 30 min afterwards.    Return in about 4 weeks (around 09/22/2023).    Assessment and Plan: Brittany Riggs is a 52 y.o. female with: *** Assessment and Plan              No follow-ups on file.  No orders of the defined types were placed in this encounter.  Lab Orders  No laboratory test(s) ordered today    Diagnostics: Spirometry:  Tracings reviewed. Her effort: {Blank single:19197::"Good reproducible efforts.","It was hard to get consistent efforts and there is a question as to whether this reflects a maximal maneuver.","Poor effort,  data can not be interpreted."} FVC: ***L FEV1: ***L, ***% predicted FEV1/FVC ratio: ***% Interpretation: {Blank single:19197::"Spirometry consistent with mild obstructive disease","Spirometry  consistent with moderate obstructive disease","Spirometry consistent with severe obstructive disease","Spirometry consistent with possible restrictive disease","Spirometry consistent with mixed obstructive and restrictive disease","Spirometry uninterpretable due to technique","Spirometry consistent with normal pattern","No overt abnormalities noted given today's efforts"}.  Please see scanned spirometry results for details.  Skin Testing: {Blank single:19197::"Select foods","Environmental allergy panel","Environmental allergy panel and select foods","Food allergy panel","None","Deferred due to recent antihistamines use"}. *** Results discussed with patient/family.   Medication List:  Current Outpatient Medications  Medication Sig Dispense Refill   albuterol (PROVENTIL) (2.5 MG/3ML) 0.083% nebulizer solution Take 3 mLs (2.5 mg total) by nebulization every 4 (four) hours as needed for wheezing or shortness of breath (coughing fits). 150 mL 1   albuterol (VENTOLIN HFA) 108 (90 Base) MCG/ACT inhaler Inhale 2 puffs into the lungs every 4 (four) hours as needed for wheezing or shortness of breath (coughing fits). 18 g 1   Ascorbic Acid (VITAMIN C) 1000 MG tablet 1 tablet     Budeson-Glycopyrrol-Formoterol (BREZTRI AEROSPHERE) 160-9-4.8 MCG/ACT AERO Inhale 2 puffs into the lungs in the morning and at bedtime. 11 g 1   budesonide (PULMICORT) 0.5 MG/2ML nebulizer solution Take 2 mLs (0.5 mg total) by nebulization in the morning and at bedtime. 120 mL 1   Carbinoxamine Maleate 4 MG TABS Take 1.5 tablets (6 mg total) by mouth in the morning and at bedtime. 60 tablet 2   carvedilol (COREG) 3.125 MG tablet Take 3.125 mg by mouth 2 (two) times daily with a meal.      chlorthalidone (HYGROTON) 25 MG tablet 1 tablet in the morning with food     Cholecalciferol (VITAMIN D3) 5000 units TABS Take 5,000 Units by mouth daily.      dextromethorphan 15 MG/5ML syrup Take 10 mLs (30 mg total) by mouth 4 (four) times  daily as needed for cough. 120 mL 1   EPINEPHrine 0.3 mg/0.3 mL IJ SOAJ injection Inject 0.3 mg into the muscle as needed for anaphylaxis. 2 each 1   Eszopiclone 3 MG TABS Take 3 mg by mouth at bedtime as needed (sleep). Take immediately before bedtime     hydroxychloroquine (PLAQUENIL) 200 MG tablet Take 200 mg by mouth daily. 2 tabs daily     hydroxychloroquine (PLAQUENIL) 200 MG tablet See admin instructions.     irbesartan (AVAPRO) 300 MG tablet 1 tablet     JOLESSA 0.15-0.03 MG tablet Take 1 tablet by mouth daily.     levonorgestrel-ethinyl estradiol (SEASONALE) 0.15-0.03 MG tablet 1 tablet     Lifitegrast 5 % SOLN Place 1 drop into both eyes 2 (two) times daily.     loperamide (IMODIUM) 2 MG capsule Take 1 capsule (2 mg total) by mouth 4 (four) times daily as needed for diarrhea or loose stools. 20 capsule 0   metaxalone (SKELAXIN) 800 MG tablet 1 tablet     methylPREDNISolone (MEDROL DOSEPAK) 4 MG TBPK tablet Take 6 tablets on day 1, 5 tablets on day 2, 4 tabs on day 3, 3 tabs on day 4, 2 tabs on day 5, 1 tab on day 6. 21 tablet 0   metroNIDAZOLE (METROCREAM) 0.75 % cream Apply 1 application topically See admin instructions.      montelukast (SINGULAIR) 10 MG tablet Take 1 tablet (10 mg total) by mouth at bedtime. 90 tablet 1   naratriptan (AMERGE) 2.5 MG tablet Take 2.5 mg by mouth as  needed for migraine.   5   NURTEC 75 MG TBDP Take 1 tablet by mouth daily as needed.     ondansetron (ZOFRAN ODT) 4 MG disintegrating tablet Take 1 tablet (4 mg total) by mouth every 8 (eight) hours as needed for nausea or vomiting. 21 tablet 0   pantoprazole (PROTONIX) 40 MG tablet Take 40 mg by mouth daily.     promethazine-dextromethorphan (PROMETHAZINE-DM) 6.25-15 MG/5ML syrup 5 ml as needed     Respiratory Therapy Supplies (FLUTTER) DEVI Use as directed 1 each 0   Rimegepant Sulfate (NURTEC) 75 MG TBDP      Tezepelumab-ekko (TEZSPIRE) 210 MG/1. SOAJ Inject 210 mg into the skin every 28  (twenty-eight) days. 1.91 mL 11   tiZANidine (ZANAFLEX) 2 MG tablet Take 1 tablet (2 mg total) by mouth at bedtime as needed for muscle spasms. 30 tablet 0   traMADol (ULTRAM) 50 MG tablet 1-2 every 4 hours as needed for cough or pain (Patient taking differently: Take 50-100 mg by mouth every 4 (four) hours as needed (pain). 1-2 every 4 hours as needed for cough or pain) 40 tablet 0   valACYclovir (VALTREX) 500 MG tablet Take 500 mg by mouth daily.     venlafaxine XR (EFFEXOR-XR) 150 MG 24 hr capsule Take 150 mg by mouth 2 (two) times daily with a meal.      Vitamin D, Ergocalciferol, 50000 units CAPS 1 capsule     No current facility-administered medications for this visit.   Allergies: Allergies  Allergen Reactions   Amoxicillin Hives   Codeine Itching   Penicillins Hives    Has patient had a PCN reaction causing immediate rash, facial/tongue/throat swelling, SOB or lightheadedness with hypotension: Yes Has patient had a PCN reaction causing severe rash involving mucus membranes or skin necrosis: no Has patient had a PCN reaction that required hospitalization: no Has patient had a PCN reaction occurring within the last 10 years: no If all of the above answers are "NO", then may proceed with Cephalosporin use.    Prednisone Other (See Comments)    Severe cramps and "spasms"   Sulfa Antibiotics Other (See Comments)    Does not remember side effects   Latex Rash and Other (See Comments)    "Sometimes bothers me"   I reviewed her past medical history, social history, family history, and environmental history and no significant changes have been reported from her previous visit.  Review of Systems  Constitutional:  Negative for appetite change, chills, fever and unexpected weight change.  HENT:  Negative for congestion, rhinorrhea and sneezing.   Eyes:  Negative for itching.  Respiratory:  Positive for cough, chest tightness and shortness of breath. Negative for wheezing.    Gastrointestinal:  Negative for abdominal pain.  Skin:  Negative for rash.  Allergic/Immunologic: Positive for environmental allergies and food allergies.  Neurological:  Negative for headaches.    Objective: There were no vitals taken for this visit. There is no height or weight on file to calculate BMI. Physical Exam Vitals and nursing note reviewed.  Constitutional:      Appearance: Normal appearance. She is well-developed. She is obese.  HENT:     Head: Normocephalic and atraumatic.     Right Ear: Tympanic membrane and external ear normal.     Left Ear: Tympanic membrane and external ear normal.     Nose: Nose normal.     Mouth/Throat:     Mouth: Mucous membranes are moist.  Pharynx: Oropharynx is clear.  Eyes:     Conjunctiva/sclera: Conjunctivae normal.  Cardiovascular:     Rate and Rhythm: Normal rate and regular rhythm.     Heart sounds: Normal heart sounds. No murmur heard. Pulmonary:     Effort: Pulmonary effort is normal.     Breath sounds: Normal breath sounds. No wheezing, rhonchi or rales.     Comments: Dry coughing during visit. Musculoskeletal:     Cervical back: Neck supple.  Skin:    General: Skin is warm.     Findings: No rash.  Neurological:     Mental Status: She is alert and oriented to person, place, and time.  Psychiatric:        Behavior: Behavior normal.    Previous notes and tests were reviewed. The plan was reviewed with the patient/family, and all questions/concerned were addressed.  It was my pleasure to see Anntonette today and participate in her care. Please feel free to contact me with any questions or concerns.  Sincerely,  Wyline Mood, DO Allergy & Immunology  [facility] office: [phone] [facility] office: [phone]

## 2023-09-22 ENCOUNTER — Ambulatory Visit: Payer: Managed Care, Other (non HMO) | Admitting: Allergy

## 2023-09-24 ENCOUNTER — Ambulatory Visit (INDEPENDENT_AMBULATORY_CARE_PROVIDER_SITE_OTHER): Payer: Managed Care, Other (non HMO)

## 2023-09-24 DIAGNOSIS — J309 Allergic rhinitis, unspecified: Secondary | ICD-10-CM | POA: Diagnosis not present

## 2023-09-27 DIAGNOSIS — J301 Allergic rhinitis due to pollen: Secondary | ICD-10-CM | POA: Diagnosis not present

## 2023-09-27 NOTE — Progress Notes (Signed)
VIALS EXP 09-26-24

## 2023-10-06 ENCOUNTER — Ambulatory Visit
Admission: RE | Admit: 2023-10-06 | Discharge: 2023-10-06 | Disposition: A | Discharge status: Home / Self Care | Payer: 59 | Source: Ambulatory Visit | Attending: Family Medicine | Admitting: Family Medicine

## 2023-10-06 DIAGNOSIS — Z0000 Encounter for general adult medical examination without abnormal findings: Secondary | ICD-10-CM

## 2023-10-08 ENCOUNTER — Other Ambulatory Visit: Payer: Self-pay | Admitting: Gastroenterology

## 2023-10-22 ENCOUNTER — Ambulatory Visit (INDEPENDENT_AMBULATORY_CARE_PROVIDER_SITE_OTHER): Payer: Managed Care, Other (non HMO)

## 2023-10-22 DIAGNOSIS — J309 Allergic rhinitis, unspecified: Secondary | ICD-10-CM | POA: Diagnosis not present

## 2023-11-17 ENCOUNTER — Ambulatory Visit (INDEPENDENT_AMBULATORY_CARE_PROVIDER_SITE_OTHER): Payer: Self-pay

## 2023-11-17 DIAGNOSIS — J309 Allergic rhinitis, unspecified: Secondary | ICD-10-CM

## 2023-11-26 ENCOUNTER — Ambulatory Visit (INDEPENDENT_AMBULATORY_CARE_PROVIDER_SITE_OTHER): Payer: Self-pay | Admitting: *Deleted

## 2023-11-26 ENCOUNTER — Telehealth: Payer: Self-pay | Admitting: *Deleted

## 2023-11-26 DIAGNOSIS — J309 Allergic rhinitis, unspecified: Secondary | ICD-10-CM | POA: Diagnosis not present

## 2023-11-26 NOTE — Telephone Encounter (Signed)
Patient came in today to receive her allergy injections and I noticed that she had some cough and slight congestion. I asked her if she was sick today and she stated that no she is not and she has been this way for a while now. I asked if it was her asthma flaring and she stated that it has been like this and that Dr. Selena Batten was working on it. She received her allergy injections last week, I asked her if she was like this last week, patient stated yes, and the time before that, and the time before that. Patient did receive her allergy injections today, confirmed that she took her allergy medication and had her EpiPen with her. I saw in a previous encounter that she was going to start Tezspire. I also saw where she had scheduled her follow up with Dr. Selena Batten back in October. Please advise best course of action for the patient. Patient was not friendly and was defensive during this encounter as well.

## 2023-11-29 NOTE — Telephone Encounter (Signed)
Please call patient and have her schedule a follow up visit. She was due in October due to her ongoing symptoms.  I did write to get allergy injections when she is feeling better.   Thank you.

## 2023-11-30 NOTE — Telephone Encounter (Signed)
Called to inform patient to call our office back to change telephone visit to an in person visit.

## 2023-11-30 NOTE — Telephone Encounter (Signed)
She needs an in office visit not a televisit.  Please call and change.  Thank you.

## 2023-11-30 NOTE — Telephone Encounter (Signed)
Called and scheduled patient for a telephone visit for the 30th at 3:30 with Dr. Selena Batten as that was all patient was able to do.

## 2023-12-02 ENCOUNTER — Ambulatory Visit (INDEPENDENT_AMBULATORY_CARE_PROVIDER_SITE_OTHER): Payer: Managed Care, Other (non HMO)

## 2023-12-02 DIAGNOSIS — J309 Allergic rhinitis, unspecified: Secondary | ICD-10-CM | POA: Diagnosis not present

## 2023-12-02 NOTE — Telephone Encounter (Signed)
Patient came in today for her injection and I informed her of the messages we've left. Patient stated that she will be out of town and she will call back to schedule an in person visit.

## 2023-12-02 NOTE — Telephone Encounter (Signed)
Called patient - DOB/NEED DPR - LMOVM to contact office to schedule in person office visit.  I will send patient a myChart message as well.  When patient call back - please advise of above notation.

## 2023-12-13 ENCOUNTER — Telehealth: Payer: Managed Care, Other (non HMO) | Admitting: Allergy

## 2023-12-28 NOTE — Progress Notes (Signed)
 Follow Up Note  RE: Brittany Riggs MRN: [id number] DOB: 07/13/1971 Date of Office Visit: 12/29/2023  Referring provider: Victorio Grave, MD Primary care provider: Victorio Grave, MD  Chief Complaint: Follow-up and Cough (Off and on since June 2024. Got sick Dec 26 and the cough has been present since then.)  History of Present Illness: I had the pleasure of seeing Brittany Riggs for a follow up visit at the [facility] on 12/29/2023. She is a 53 y.o. female, who is being followed for asthma, allergic rhinitis on AIT, food allergy , GERD. Her previous allergy  office visit was on 08/25/2023 with Dr. Burdette Carolin. Today is a regular follow up visit.  Discussed the use of AI scribe software for clinical note transcription with the patient, who gave verbal consent to proceed.  The patient presents with a persistent cough that has been ongoing for approximately seven months. The cough, which has been a constant presence, has seen some improvement with the use of Tezspire  but has never fully resolved. The patient reports an exacerbation of the cough following an illness around Christmas. Despite the use of various treatments including steroids, antibiotics, and nebulizer treatments with albuterol  and budesonide , the cough remains persistent and severe.  The patient also reports the production of yellow phlegm, which has been ongoing despite a recent course of antibiotics (Z-Pak). She expresses a preference for cough medicine with codeine, despite the fact it makes her slightly itchy as she can't sleep due to the cough.   The patient has a history of seeing a pulmonologist but has not found these visits helpful. She has not previously seen an ear, nose, and throat doctor. The patient also reports taking Plaquenil  for rheumatoid arthritis.   The patient's cough is severe enough to be noticeable in the waiting room and is described as a harsh cough rather than a tickle. The patient reports  that the cough is worse when talking and that it can cause a rapid heartbeat. She also notes that certain treatments, such as the use of a nebulizer or the inhalation of certain medications, can initially exacerbate the cough before it calms down.  The patient has a history of changes in her lungs following a COVID-19 infection, including ground glass opacities and bronchiectasis on the CT chest done in 2021.   She is still receiving allergy  shots and believes these have helped with symptoms such as sneezing.  The patient's persistent cough has been a significant issue, affecting her quality of life and causing frustration due to the lack of improvement with various treatments.     Assessment and Plan: Beauton is a 53 y.o. female with: Chronic coughing Persistent cough despite treatment with Tezspire , Breztri , Singulair , and nebulized albuterol  and budesonide . Cough flared with recent upper respiratory infection treated with Z-Pak and Tessalon  Pearls. Yellow sputum production. Prior CT chest in 2021 showed ground glass opacities and bronchiectasis.  Letter written - to be excused for work this week. Refer to ENT for chronic coughing.  Get CT scan of chest - get after done with antibiotics.  Take hycodan 5mL every 6 hours as needed for coughing. Printed Rx for this.  Recurrent infections Take doxycycline  100mg  twice a day for 10 day.s Diflucan  sent in. Take with probiotics.  Get bloodwork to look at immune system.  Not well controlled severe persistent asthma Started Tezspire  with some marginal benefit per patient.  Depo 80mg  IM given today. Daily controller medication(s):  Continue Breztri  2 puffs twice a  day with spacer and rinse mouth afterwards. Continue Singulair  (montelukast ) 10mg  daily at night. Tezspire  injections every 4 weeks.  During respiratory infections/asthma flares:  Start Pulmicort  0.5mg  nebulizer twice a day for 1-2 weeks until your breathing symptoms return to  baseline.  Pretreat with albuterol  nebulizer.  If you need to use your albuterol  nebulizer machine back to back within 15-30 minutes with no relief then please go to the ER/urgent care for further evaluation.  Get spirometry at next visit.  Anaphylactic reaction due to food, subsequent encounter Past history - Sesame causes itching. 2021 bloodwork borderline positive to shellfish.  Continue to avoid shellfish and sesame. For mild symptoms you can take over the counter antihistamines such as Benadryl and monitor symptoms closely. If symptoms worsen or if you have severe symptoms including breathing issues, throat closure, significant swelling, whole body hives, severe diarrhea and vomiting, lightheadedness then inject epinephrine  and seek immediate medical care afterwards.  Seasonal allergic rhinitis due to pollen Allergic rhinitis due to animal dander Allergic rhinitis due to dust mite Allergic rhinitis due to mold Allergy  to cockroaches Past history - started AIT on 07/02/2016 (G/Weeds/T + M/DM/C/CR). Nasal sprays cause epistaxis. Patient has dry eyes.  Continue allergy  injections - wait until you are feeling better to get it.  Continue environmental control measures.  Continue Singulair  (montelukast ) 10mg  daily at night. Take carbinoxamine  6mg  twice a day.  May use nasal saline spray (i.e., Simply Saline) as needed.  Gastroesophageal reflux disease, unspecified whether esophagitis present Continue lifestyle and dietary modifications. Take pantoprazole  40mg  twice a day. Nothing to eat or drink for 30 min afterwards.   Elevated blood pressure Follow up with PCP regarding this   Return in about 2 months (around 02/26/2024).  Meds ordered this encounter  Medications   HYDROcodone  bit-homatropine (HYCODAN) 5-1.5 MG/5ML syrup    Sig: Take 5 mLs by mouth every 6 (six) hours as needed for cough.    Dispense:  120 mL    Refill:  0   doxycycline  (VIBRAMYCIN ) 100 MG capsule    Sig: Take 1  capsule (100 mg total) by mouth 2 (two) times daily.    Dispense:  20 capsule    Refill:  0   fluconazole  (DIFLUCAN ) 150 MG tablet    Sig: Take 1 tablet (150 mg total) by mouth daily.    Dispense:  1 tablet    Refill:  0   methylPREDNISolone  acetate (DEPO-MEDROL ) injection 80 mg   Lab Orders         CBC with Differential/Platelet         Comprehensive metabolic panel         C-reactive protein         IgG, IgA, IgM         Tryptase         Strep pneumoniae 23 Serotypes IgG         Diphtheria / Tetanus Antibody Panel         Lymph Enumeration, Basic & NK Cells         Alpha-1-antitrypsin      Diagnostics: None.   Medication List:  Current Outpatient Medications  Medication Sig Dispense Refill   albuterol  (PROVENTIL ) (2.5 MG/3ML) 0.083% nebulizer solution Take 3 mLs (2.5 mg total) by nebulization every 4 (four) hours as needed for wheezing or shortness of breath (coughing fits). 150 mL 1   albuterol  (VENTOLIN  HFA) 108 (90 Base) MCG/ACT inhaler Inhale 2 puffs into the lungs every 4 (four) hours as  needed for wheezing or shortness of breath (coughing fits). 18 g 1   Ascorbic Acid  (VITAMIN C) 1000 MG tablet 1 tablet     Budeson-Glycopyrrol-Formoterol  (BREZTRI  AEROSPHERE) 160-9-4.8 MCG/ACT AERO Inhale 2 puffs into the lungs in the morning and at bedtime. 11 g 1   budesonide  (PULMICORT ) 0.5 MG/2ML nebulizer solution Take 2 mLs (0.5 mg total) by nebulization in the morning and at bedtime. 120 mL 1   Carbinoxamine  Maleate 4 MG TABS Take 1.5 tablets (6 mg total) by mouth in the morning and at bedtime. 60 tablet 2   carvedilol  (COREG ) 3.125 MG tablet Take 3.125 mg by mouth 2 (two) times daily with a meal.      chlorthalidone  (HYGROTON ) 25 MG tablet 1 tablet in the morning with food     Cholecalciferol (VITAMIN D3) 5000 units TABS Take 5,000 Units by mouth daily.      dextromethorphan  15 MG/5ML syrup Take 10 mLs (30 mg total) by mouth 4 (four) times daily as needed for cough. 120 mL 1    doxycycline  (VIBRAMYCIN ) 100 MG capsule Take 1 capsule (100 mg total) by mouth 2 (two) times daily. 20 capsule 0   EPINEPHrine  0.3 mg/0.3 mL IJ SOAJ injection Inject 0.3 mg into the muscle as needed for anaphylaxis. 2 each 1   Eszopiclone 3 MG TABS Take 3 mg by mouth at bedtime as needed (sleep). Take immediately before bedtime     fluconazole  (DIFLUCAN ) 150 MG tablet Take 1 tablet (150 mg total) by mouth daily. 1 tablet 0   HYDROcodone  bit-homatropine (HYCODAN) 5-1.5 MG/5ML syrup Take 5 mLs by mouth every 6 (six) hours as needed for cough. 120 mL 0   hydroxychloroquine  (PLAQUENIL ) 200 MG tablet Take 200 mg by mouth daily. 2 tabs daily     hydroxychloroquine  (PLAQUENIL ) 200 MG tablet See admin instructions.     irbesartan (AVAPRO) 300 MG tablet 1 tablet     JOLESSA 0.15-0.03 MG tablet Take 1 tablet by mouth daily.     levonorgestrel-ethinyl estradiol (SEASONALE) 0.15-0.03 MG tablet 1 tablet     Lifitegrast 5 % SOLN Place 1 drop into both eyes 2 (two) times daily.     loperamide  (IMODIUM ) 2 MG capsule Take 1 capsule (2 mg total) by mouth 4 (four) times daily as needed for diarrhea or loose stools. 20 capsule 0   metaxalone  (SKELAXIN ) 800 MG tablet 1 tablet     methylPREDNISolone  (MEDROL  DOSEPAK) 4 MG TBPK tablet Take 6 tablets on day 1, 5 tablets on day 2, 4 tabs on day 3, 3 tabs on day 4, 2 tabs on day 5, 1 tab on day 6. 21 tablet 0   metroNIDAZOLE (METROCREAM) 0.75 % cream Apply 1 application topically See admin instructions.      montelukast  (SINGULAIR ) 10 MG tablet Take 1 tablet (10 mg total) by mouth at bedtime. 90 tablet 1   naratriptan (AMERGE) 2.5 MG tablet Take 2.5 mg by mouth as needed for migraine.   5   NURTEC 75 MG TBDP Take 1 tablet by mouth daily as needed.     ondansetron  (ZOFRAN  ODT) 4 MG disintegrating tablet Take 1 tablet (4 mg total) by mouth every 8 (eight) hours as needed for nausea or vomiting. 21 tablet 0   pantoprazole  (PROTONIX ) 40 MG tablet Take 40 mg by mouth daily.      Respiratory Therapy Supplies (FLUTTER) DEVI Use as directed 1 each 0   Rimegepant Sulfate (NURTEC) 75 MG TBDP      Tezepelumab -ekko (  TEZSPIRE ) 210 MG/1. SOAJ Inject 210 mg into the skin every 28 (twenty-eight) days. 1.91 mL 11   tiZANidine  (ZANAFLEX ) 2 MG tablet Take 1 tablet (2 mg total) by mouth at bedtime as needed for muscle spasms. 30 tablet 0   traMADol  (ULTRAM ) 50 MG tablet 1-2 every 4 hours as needed for cough or pain (Patient taking differently: Take 50-100 mg by mouth every 4 (four) hours as needed (pain). 1-2 every 4 hours as needed for cough or pain) 40 tablet 0   valACYclovir (VALTREX) 500 MG tablet Take 500 mg by mouth daily.     venlafaxine  XR (EFFEXOR -XR) 150 MG 24 hr capsule Take 150 mg by mouth 2 (two) times daily with a meal.      Vitamin D , Ergocalciferol , 50000 units CAPS 1 capsule     No current facility-administered medications for this visit.   Allergies: Allergies  Allergen Reactions   Amoxicillin Hives   Codeine Itching   Levaquin [Levofloxacin]     Severe diarrhea   Penicillins Hives    Has patient had a PCN reaction causing immediate rash, facial/tongue/throat swelling, SOB or lightheadedness with hypotension: Yes Has patient had a PCN reaction causing severe rash involving mucus membranes or skin necrosis: no Has patient had a PCN reaction that required hospitalization: no Has patient had a PCN reaction occurring within the last 10 years: no If all of the above answers are "NO", then may proceed with Cephalosporin use.    Prednisone  Other (See Comments)    Severe cramps and "spasms"   Sulfa Antibiotics Other (See Comments)    Does not remember side effects   Latex Rash and Other (See Comments)    "Sometimes bothers me"   I reviewed her past medical history, social history, family history, and environmental history and no significant changes have been reported from her previous visit.  Review of Systems  Constitutional:  Negative for appetite change,  chills, fever and unexpected weight change.  HENT:  Negative for congestion, rhinorrhea and sneezing.   Eyes:  Negative for itching.  Respiratory:  Positive for cough, chest tightness and shortness of breath. Negative for wheezing.   Gastrointestinal:  Negative for abdominal pain.  Skin:  Negative for rash.  Allergic/Immunologic: Positive for environmental allergies and food allergies.  Neurological:  Negative for headaches.    Objective: BP (!) 150/70   Pulse (!) 124   Temp (!) 97.5 F (36.4 C) (Temporal)   Resp 16   Ht 5' 7.42" (1.712 m)   Wt (!) 358 lb 1.6 oz (162.4 kg)   SpO2 95%   BMI 55.39 kg/m  Body mass index is 55.39 kg/m. Physical Exam Vitals and nursing note reviewed.  Constitutional:      Appearance: Normal appearance. She is well-developed. She is obese.  HENT:     Head: Normocephalic and atraumatic.     Right Ear: Tympanic membrane and external ear normal.     Left Ear: Tympanic membrane and external ear normal.     Nose: Nose normal.     Mouth/Throat:     Mouth: Mucous membranes are moist.     Pharynx: Oropharynx is clear.  Eyes:     Conjunctiva/sclera: Conjunctivae normal.  Cardiovascular:     Rate and Rhythm: Normal rate and regular rhythm.     Heart sounds: Normal heart sounds. No murmur heard. Pulmonary:     Effort: Pulmonary effort is normal.     Breath sounds: Normal breath sounds. No wheezing, rhonchi or rales.  Comments: Dry coughing during visit. Musculoskeletal:     Cervical back: Neck supple.  Skin:    General: Skin is warm.     Findings: No rash.  Neurological:     Mental Status: She is alert and oriented to person, place, and time.  Psychiatric:        Behavior: Behavior normal.    Previous notes and tests were reviewed. The plan was reviewed with the patient/family, and all questions/concerned were addressed.  It was my pleasure to see Shawon today and participate in her care. Please feel free to contact me with any questions  or concerns.  Sincerely,  Eudelia Hero, DO Allergy  & Immunology  [facility]  [place] office: [phone] [facility] office: [phone]

## 2023-12-29 ENCOUNTER — Encounter: Payer: Self-pay | Admitting: Allergy

## 2023-12-29 ENCOUNTER — Other Ambulatory Visit: Payer: Self-pay

## 2023-12-29 ENCOUNTER — Ambulatory Visit: Payer: Managed Care, Other (non HMO) | Admitting: Allergy

## 2023-12-29 ENCOUNTER — Telehealth: Payer: Self-pay

## 2023-12-29 VITALS — BP 150/70 | HR 124 | Temp 97.5°F | Resp 16 | Ht 67.42 in | Wt 358.1 lb

## 2023-12-29 DIAGNOSIS — B999 Unspecified infectious disease: Secondary | ICD-10-CM

## 2023-12-29 DIAGNOSIS — R053 Chronic cough: Secondary | ICD-10-CM

## 2023-12-29 DIAGNOSIS — J3089 Other allergic rhinitis: Secondary | ICD-10-CM

## 2023-12-29 DIAGNOSIS — J301 Allergic rhinitis due to pollen: Secondary | ICD-10-CM

## 2023-12-29 DIAGNOSIS — J4550 Severe persistent asthma, uncomplicated: Secondary | ICD-10-CM

## 2023-12-29 DIAGNOSIS — R030 Elevated blood-pressure reading, without diagnosis of hypertension: Secondary | ICD-10-CM

## 2023-12-29 DIAGNOSIS — T7800XD Anaphylactic reaction due to unspecified food, subsequent encounter: Secondary | ICD-10-CM | POA: Diagnosis not present

## 2023-12-29 DIAGNOSIS — J3081 Allergic rhinitis due to animal (cat) (dog) hair and dander: Secondary | ICD-10-CM

## 2023-12-29 DIAGNOSIS — Z91038 Other insect allergy status: Secondary | ICD-10-CM

## 2023-12-29 DIAGNOSIS — K219 Gastro-esophageal reflux disease without esophagitis: Secondary | ICD-10-CM

## 2023-12-29 MED ORDER — HYDROCODONE BIT-HOMATROP MBR 5-1.5 MG/5ML PO SOLN: 5.0000 mL | Freq: Four times a day (QID) | ORAL | 0 refills | Status: DC | PRN

## 2023-12-29 MED ORDER — METHYLPREDNISOLONE ACETATE 80 MG/ML IJ SUSP
80.0000 mg | Freq: Once | INTRAMUSCULAR | Status: AC
  Administered 2023-12-29: 80 mg via INTRAMUSCULAR

## 2023-12-29 MED ORDER — FLUCONAZOLE 150 MG PO TABS: 150.0000 mg | ORAL_TABLET | Freq: Every day | ORAL | 0 refills | Status: DC

## 2023-12-29 MED ORDER — DOXYCYCLINE HYCLATE 100 MG PO CAPS: 100.0000 mg | ORAL_CAPSULE | Freq: Two times a day (BID) | ORAL | 0 refills | Status: DC

## 2023-12-29 NOTE — Patient Instructions (Addendum)
 Coughing Letter written - to be excused for work this week.  Refer to ENT for chronic coughing.  Get CT scan of chest - get after done with antibiotics.  Take hycodan 5mL every 6 hours as needed for coughing. This may make you tired. Take doxycycline  100mg  twice a day for 10 day.s Diflucan  sent in. Take with probiotics.   Asthma:  Take honey for coughing - to soothe and coat the throat. Take dextromethorphan  10mL four times a day as needed for coughing. Depo 80mg  IM given today.  Daily controller medication(s):  Continue Breztri  2 puffs twice a day with spacer and rinse mouth afterwards. Continue Singulair  (montelukast ) 10mg  daily at night. Tezspire  injections every 4 weeks.  During respiratory infections/asthma flares:  Start Pulmicort  0.5mg  nebulizer twice a day for 1-2 weeks until your breathing symptoms return to baseline.  Pretreat with albuterol  nebulizer.  If you need to use your albuterol  nebulizer machine back to back within 15-30 minutes with no relief then please go to the ER/urgent care for further evaluation.  Asthma control goals:  Full participation in all desired activities (may need albuterol  before activity) Albuterol  use two times or less a week on average (not counting use with activity) Cough interfering with sleep two times or less a month Oral steroids no more than once a year No hospitalizations   Seasonal and perennial allergic rhinitis Continue allergy  injections - wait until you are feeling better to get it.  Continue environmental control measures.  Continue Singulair  (montelukast ) 10mg  daily at night. Take carbinoxamine  6mg  twice a day.  May use nasal saline spray (i.e., Simply Saline) as needed.   Food allergy  Continue to avoid shellfish and sesame. For mild symptoms you can take over the counter antihistamines such as Benadryl and monitor symptoms closely. If symptoms worsen or if you have severe symptoms including breathing issues, throat  closure, significant swelling, whole body hives, severe diarrhea and vomiting, lightheadedness then inject epinephrine  and seek immediate medical care afterwards.  Heartburn Continue lifestyle and dietary modifications. Take pantoprazole  40mg  twice a day. Nothing to eat or drink for 30 min afterwards.   Elevated blood pressure  Blood pressure reading was high in our office today. Vitals:   12/29/23 1056  BP: (!) 150/70   Please follow up with PCP regarding this.    Follow up in 2 months or sooner if needed.   Drink plenty of fluids. Water, juice, clear broth or warm lemon water are good choices. Avoid caffeine and alcohol, which can dehydrate you. Eat chicken soup. Chicken soup and other warm fluids can be soothing and loosen congestion. Rest. Adjust your room's temperature and humidity. Keep your room warm but not overheated. If the air is dry, a cool-mist humidifier or vaporizer can moisten the air and help ease congestion and coughing. Keep the humidifier clean to prevent the growth of bacteria and molds. Soothe your throat. Perform a saltwater gargle. Dissolve one-quarter to a half teaspoon of salt in a 4- to 8-ounce glass of warm water. This can relieve a sore or scratchy throat temporarily. Use saline nasal drops. To help relieve nasal congestion, try saline nasal drops. You can buy these drops over the counter, and they can help relieve symptoms ? even in children. Take over-the-counter cold and cough medications. For adults and children older than 5, over-the-counter decongestants, antihistamines and pain relievers might offer some symptom relief. However, they won't prevent a cold or shorten its duration.

## 2023-12-29 NOTE — Telephone Encounter (Signed)
 Per Dr. Burdette Carolin:  Please refer patient to ENT for chronic cough.

## 2024-01-05 ENCOUNTER — Encounter: Payer: Self-pay | Admitting: Allergy

## 2024-01-05 LAB — TRYPTASE: Tryptase: 5.6 ug/L (ref 2.2–13.2)

## 2024-01-05 LAB — COMPREHENSIVE METABOLIC PANEL WITH GFR
AST: 44 IU/L — ABNORMAL HIGH (ref 0–40)
Albumin: 4.5 g/dL (ref 3.8–4.9)
BUN/Creatinine Ratio: 13 (ref 9–23)
BUN: 20 mg/dL (ref 6–24)
CO2: 23 mmol/L (ref 20–29)
Calcium: 9.3 mg/dL (ref 8.7–10.2)
Chloride: 100 mmol/L (ref 96–106)
Creatinine, Ser: 1.59 mg/dL — ABNORMAL HIGH (ref 0.57–1.00)
Glucose: 84 mg/dL (ref 70–99)
Potassium: 4.1 mmol/L (ref 3.5–5.2)

## 2024-01-05 LAB — STREP PNEUMONIAE 23 SEROTYPES IGG
Pneumo Ab Type 1*: 0.5 ug/mL — ABNORMAL LOW (ref >1.3)
Pneumo Ab Type 12 (12F)*: <0.1 ug/mL — ABNORMAL LOW (ref >1.3)
Pneumo Ab Type 14*: 11.9 ug/mL (ref >1.3)
Pneumo Ab Type 17 (17F)*: 1.9 ug/mL (ref >1.3)
Pneumo Ab Type 19 (19F)*: 0.4 ug/mL — ABNORMAL LOW (ref >1.3)
Pneumo Ab Type 2*: 3.4 ug/mL (ref >1.3)
Pneumo Ab Type 20*: <0.2 ug/mL — ABNORMAL LOW (ref >1.3)
Pneumo Ab Type 22 (22F)*: <0.1 ug/mL — ABNORMAL LOW (ref >1.3)
Pneumo Ab Type 23 (23F)*: 0.3 ug/mL — ABNORMAL LOW (ref >1.3)
Pneumo Ab Type 26 (6B)*: <0.1 ug/mL — ABNORMAL LOW (ref >1.3)
Pneumo Ab Type 3*: <0.1 ug/mL — ABNORMAL LOW (ref >1.3)
Pneumo Ab Type 34 (10A)*: <0.1 ug/mL — ABNORMAL LOW (ref >1.3)
Pneumo Ab Type 4*: <0.1 ug/mL — ABNORMAL LOW (ref >1.3)
Pneumo Ab Type 43 (11A)*: <0.1 ug/mL — ABNORMAL LOW (ref >1.3)
Pneumo Ab Type 5*: 0.6 ug/mL — ABNORMAL LOW (ref >1.3)
Pneumo Ab Type 51 (7F)*: 1.2 ug/mL — ABNORMAL LOW (ref >1.3)
Pneumo Ab Type 54 (15B)*: <0.2 ug/mL — ABNORMAL LOW (ref >1.3)
Pneumo Ab Type 56 (18C)*: <0.1 ug/mL — ABNORMAL LOW (ref >1.3)
Pneumo Ab Type 57 (19A)*: 1.8 ug/mL (ref >1.3)
Pneumo Ab Type 68 (9V)*: <0.1 ug/mL — ABNORMAL LOW (ref >1.3)
Pneumo Ab Type 70 (33F)*: 3.4 ug/mL (ref >1.3)
Pneumo Ab Type 8*: 14.1 ug/mL (ref >1.3)
Pneumo Ab Type 9 (9N)*: 3.7 ug/mL (ref >1.3)

## 2024-01-05 LAB — LYMPH ENUMERATION, BASIC & NK CELLS
% CD 3 Pos. Lymph.: 72.7 % (ref 57.5–86.2)
% CD 4 Pos. Lymph.: 62 % — ABNORMAL HIGH (ref 30.8–58.5)
% NK (CD56/16): 11 % (ref 1.4–19.4)
Ab NK (CD56/16): 154 /uL (ref 24–406)
Absolute CD 3: 1018 /uL (ref 622–2402)
Absolute CD 4 Helper: 868 /uL (ref 359–1519)
Basophils Absolute: 0 10*3/uL (ref 0.0–0.2)
Basos: 0 %
CD19 % B Cell: 15.1 % (ref 3.3–25.4)
CD19 Abs: 211 /uL (ref 12–645)
CD4/CD8 Ratio: 6.26 — ABNORMAL HIGH (ref 0.92–3.72)
CD8 % Suppressor T Cell: 9.9 % — ABNORMAL LOW (ref 12.0–35.5)
CD8 T Cell Abs: 139 /uL (ref 109–897)
EOS (ABSOLUTE): 0 10*3/uL (ref 0.0–0.4)
Eos: 0 %
Hematocrit: 39.2 % (ref 34.0–46.6)
Hemoglobin: 12.7 g/dL (ref 11.1–15.9)
Immature Grans (Abs): 0 10*3/uL (ref 0.0–0.1)
Immature Granulocytes: 0 %
Lymphocytes Absolute: 1.4 10*3/uL (ref 0.7–3.1)
Lymphs: 25 %
MCH: 33.1 pg — ABNORMAL HIGH (ref 26.6–33.0)
MCHC: 32.4 g/dL (ref 31.5–35.7)
MCV: 102 fL — ABNORMAL HIGH (ref 79–97)
Monocytes Absolute: 0.5 10*3/uL (ref 0.1–0.9)
Monocytes: 9 %
Neutrophils Absolute: 3.7 10*3/uL (ref 1.4–7.0)
Neutrophils: 66 %
Platelets: 365 10*3/uL (ref 150–450)
RBC: 3.84 x10E6/uL (ref 3.77–5.28)
RDW: 12.8 % (ref 11.7–15.4)
WBC: 5.6 10*3/uL (ref 3.4–10.8)

## 2024-01-05 LAB — DIPHTHERIA / TETANUS ANTIBODY PANEL
Diphtheria Ab: 0.33 [IU]/mL (ref <0.10)
Tetanus Ab, IgG: 2.3 [IU]/mL (ref <0.10)

## 2024-01-05 LAB — ALPHA-1-ANTITRYPSIN: A-1 Antitrypsin: 178 mg/dL (ref 101–187)

## 2024-01-05 LAB — IGG, IGA, IGM
IgA/Immunoglobulin A, Serum: 131 mg/dL (ref 87–352)
IgG (Immunoglobin G), Serum: 1239 mg/dL (ref 586–1602)
IgM (Immunoglobulin M), Srm: 113 mg/dL (ref 26–217)

## 2024-01-05 LAB — COMPREHENSIVE METABOLIC PANEL
ALT: 45 [IU]/L — ABNORMAL HIGH (ref 0–32)
Alkaline Phosphatase: 66 [IU]/L (ref 44–121)
Bilirubin Total: 0.7 mg/dL (ref 0.0–1.2)
Globulin, Total: 2.5 g/dL (ref 1.5–4.5)
Sodium: 138 mmol/L (ref 134–144)
Total Protein: 7 g/dL (ref 6.0–8.5)
eGFR: 39 mL/min/{1.73_m2} — ABNORMAL LOW (ref >59)

## 2024-01-05 LAB — C-REACTIVE PROTEIN: CRP: <1 mg/L (ref 0–10)

## 2024-01-07 NOTE — Telephone Encounter (Signed)
Vida Roller,  Can you give me a hand with this referral?  Thanks

## 2024-01-10 NOTE — Telephone Encounter (Signed)
Leonilda has been internally referred to:  [facility] [address] [place],  [place]  [address]  Main: [phone] Fax: [phone]   Once it populates into their WQ they will reach out to the patient to schedule.  I have informed Serrena via [employer].

## 2024-01-20 ENCOUNTER — Telehealth: Payer: Self-pay | Admitting: Allergy

## 2024-01-20 ENCOUNTER — Encounter: Payer: Self-pay | Admitting: Allergy

## 2024-01-20 MED ORDER — BENZONATATE 100 MG PO CAPS: 200.0000 mg | ORAL_CAPSULE | Freq: Three times a day (TID) | ORAL | 0 refills | Status: AC | PRN

## 2024-01-20 NOTE — Telephone Encounter (Signed)
 Please place referral to pulmonology for chronic coughing, asthma despite maximal asthma treatment regimen including biologics.  Per patient request.   Thank you.

## 2024-01-21 ENCOUNTER — Encounter: Payer: Self-pay | Admitting: Allergy

## 2024-01-26 ENCOUNTER — Encounter (INDEPENDENT_AMBULATORY_CARE_PROVIDER_SITE_OTHER): Payer: Self-pay | Admitting: Otolaryngology

## 2024-01-26 ENCOUNTER — Encounter (HOSPITAL_COMMUNITY): Payer: Self-pay | Admitting: Gastroenterology

## 2024-01-26 NOTE — Progress Notes (Signed)
Attempted to obtain medical history for pre op call via telephone, unable to reach at this time. HIPAA compliant voicemail message left requesting return call to pre surgical testing department.

## 2024-01-27 NOTE — Telephone Encounter (Signed)
Referral placed. Patient updated in the original mychart encounter.

## 2024-01-28 ENCOUNTER — Ambulatory Visit (INDEPENDENT_AMBULATORY_CARE_PROVIDER_SITE_OTHER): Payer: Managed Care, Other (non HMO)

## 2024-01-28 DIAGNOSIS — J309 Allergic rhinitis, unspecified: Secondary | ICD-10-CM | POA: Diagnosis not present

## 2024-02-02 ENCOUNTER — Encounter (HOSPITAL_COMMUNITY): Admission: RE | Payer: Self-pay | Source: Home / Self Care

## 2024-02-02 ENCOUNTER — Ambulatory Visit (HOSPITAL_COMMUNITY)
Admission: RE | Admit: 2024-02-02 | Payer: Managed Care, Other (non HMO) | Source: Home / Self Care | Admitting: Gastroenterology

## 2024-02-02 SURGERY — COLONOSCOPY WITH PROPOFOL
Anesthesia: Monitor Anesthesia Care | Laterality: Bilateral

## 2024-02-14 ENCOUNTER — Other Ambulatory Visit: Payer: Managed Care, Other (non HMO)

## 2024-02-15 ENCOUNTER — Ambulatory Visit
Admission: RE | Admit: 2024-02-15 | Discharge: 2024-02-15 | Disposition: A | Discharge status: Home / Self Care | Payer: Managed Care, Other (non HMO) | Source: Ambulatory Visit | Attending: Allergy | Admitting: Allergy

## 2024-02-15 ENCOUNTER — Encounter: Payer: Self-pay | Admitting: Allergy

## 2024-02-15 DIAGNOSIS — R053 Chronic cough: Secondary | ICD-10-CM

## 2024-02-15 NOTE — Telephone Encounter (Signed)
 Please place referral to pulmonology for chronic interstitial lung disease.  Thank you.  CT chest from March 2025 showed: "There is subtle ground-glass appearance of both lungs along the posterior margins of the lower lobes which may correlate with early chronic interstitial lung disease"

## 2024-02-15 NOTE — Telephone Encounter (Signed)
 I reached out to [facility] at the Pulmonary office as I placed a referral back on 01/27/24 to their office. Will give you an update once I hear back.   Thanks

## 2024-02-16 ENCOUNTER — Ambulatory Visit: Payer: Managed Care, Other (non HMO) | Admitting: Allergy

## 2024-02-29 ENCOUNTER — Encounter: Payer: Self-pay | Admitting: Internal Medicine

## 2024-02-29 ENCOUNTER — Telehealth: Payer: Self-pay | Admitting: Internal Medicine

## 2024-02-29 ENCOUNTER — Ambulatory Visit (INDEPENDENT_AMBULATORY_CARE_PROVIDER_SITE_OTHER): Admitting: Internal Medicine

## 2024-02-29 VITALS — BP 134/80 | HR 75 | Temp 98.3°F | Ht 68.0 in | Wt 361.0 lb

## 2024-02-29 DIAGNOSIS — R0609 Other forms of dyspnea: Secondary | ICD-10-CM

## 2024-02-29 DIAGNOSIS — R053 Chronic cough: Secondary | ICD-10-CM

## 2024-02-29 DIAGNOSIS — J387 Other diseases of larynx: Secondary | ICD-10-CM | POA: Diagnosis not present

## 2024-02-29 DIAGNOSIS — U099 Post covid-19 condition, unspecified: Secondary | ICD-10-CM

## 2024-02-29 DIAGNOSIS — R918 Other nonspecific abnormal finding of lung field: Secondary | ICD-10-CM

## 2024-02-29 NOTE — Telephone Encounter (Signed)
 I think low dose is appropriate. Patient is several other CNS depressants - will need to monitor  Chesley Mires, PharmD, MPH, BCPS, CPP Clinical Pharmacist (Rheumatology and Pulmonology)

## 2024-02-29 NOTE — Telephone Encounter (Signed)
 Dear DR Selena Batten of alelrgy Dear [name] of pharmacy   Brittany Riggs -she has chronic kidney disease and is on polypharmacy.  She has chronic cough.  Any objections to starting gabapentin?.  Clearly I will do low-dose given her chronic kidney disease   Thanks    SIGNATURE    Dr. Kalman Shan, M.D., F.C.C.P,  Pulmonary and Critical Care Medicine Staff Physician, [facility] Center Director - Interstitial Lung Disease  Program  Pulmonary Fibrosis [facility] at [facility] [place], [place], [address]   Pager: [phone], If no answer  -> Check AMION or Try [phone] Telephone (clinical office): [phone] Telephone (research): [phone]  10:20 AM 02/29/2024

## 2024-02-29 NOTE — Progress Notes (Signed)
 OV 02/29/2024  Subjective:  Patient ID: Brittany Riggs, female , DOB: 1970-12-20 , age 53 y.o. , MRN: [id number] , ADDRESS: [address] [place] [address] PCP Laurann Montana, MD Patient Care Team: Laurann Montana, MD as PCP - General (Family Medicine) Ellamae Sia, DO as Consulting Physician (Allergy)  This Provider for this visit: Treatment Team:  Attending Provider: Kalman Shan, MD    02/29/2024 -   Chief Complaint  Patient presents with   Pulmonary Consult    Referred by Dr. Wyline Mood. Pt c/o increased SOB and cough for the past 1 1/2 years. She was dx with asthma as a child. Her cough is occ prod with clear sputum. She gets SOB walking short distances.      HPI Brittany Riggs 53 y.o. -new consult.  Sees Dr. Selena Batten for allergic asthma.  She is on biologic  TEZPIRE along with bunch of other medications including breast tree.  She states that in 2020 when she suffered from COVID and was hospitalized.  After that she had a CT scan of the chest that I personally visualized shows some scattered mild groundglass opacities.  She says since then she has had shortness of breath with exertion relieved by rest.  This is stable but it is not relieved.  Also at this time she is gained 50 pounds of weight with 30 pounds or 20 pounds coming in the last couple of years.  There is no associated chest pain.  When she climbs a flight of stairs she has to stop.  Before her COVID she never had these problems.  She also has chronic cough for the last 18 months.  She states is originally present in 2021 and then it got better and then 18 months ago had bronchitis and since then on and off.  It is moderate in intensity.  Some 2 years ago it was severe.  It is present day and night and does wake her up  There is some associated wheezing She is noticed to be on nonspecific beta-blocker carvedilol but not on ACE inhibitors That she is on an ARB That she does have chronic kidney  disease - There is associated fatigue - There is associated heartburn There is associated on and off wheezing - 2017 RAST allergy profile POSITIVE    Dr Gretta Cool Reflux Symptom Index (> 13-15 suggestive of LPR cough) 0 -> 5  =  none ->severe problem  Hoarseness of problem with voice 4  Clearing  Of Throat 4  Excess throat mucus or feeling of post nasal drip 4  Difficulty swallowing food, liquid or tablets 3  Cough after eating or lying down 4  Breathing difficulties or choking episodes 4  Troublesome or annoying cough 4  Sensation of something sticking in throat or lump in throat 3  Heartburn, chest pain, indigestion, or stomach acid coming up 3  TOTAL 31    CT Chest data from date: *  - personally visualized and independently interpreted : yes - my findings are: agree wtiuh findings  Narrative & Impression  CLINICAL DATA:  Chronic cough   EXAM: CT CHEST WITHOUT CONTRAST   TECHNIQUE: Multidetector CT imaging of the chest was performed following the standard protocol without IV contrast.   RADIATION DOSE REDUCTION: This exam was performed according to the departmental dose-optimization program which includes automated exposure control, adjustment of the mA and/or kV according to patient size and/or use of iterative reconstruction technique.   COMPARISON:  Chest x-ray August 25, 2023, CT chest November 01, 2020   FINDINGS: Cardiovascular: No significant vascular findings. Normal heart size. No pericardial effusion. No significant coronary artery calcifications   Mediastinum/Nodes: No enlarged mediastinal or axillary lymph nodes. Thyroid gland, trachea, and esophagus demonstrate no significant findings.   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 Unchanged since prior examination.   1.7 x 1.5 cm isodense to thyroid nodular soft tissue density to the left and posterior to the trachea consistent perhaps with a thyroid nodule  however, it appears to be deeply located to be properly image by ultrasound and follow-up CT with contrast is recommended for better characterization of the thyroid. This appears to be present on prior examination but appears better defined and slightly larger on today's examination. Could correlate with the thyroid nodule.   Lungs/Pleura: Ill-defined infiltrates in the anterior margin of the right upper lobe apical segment are chronic, unchanged since prior examination 2021. Similarly prominence of the interstitial markings of the left lung along the anterior margin subpleural distribution, are chronic without significant change since prior examination. There is minimal chronic right middle lobe and lingula hypoventilatory atelectasis as well.   There is subtle ground-glass appearance of both lungs along the posterior margins of the lower lobes which may correlate with early chronic interstitial lung disease. Without evidence of acute infiltrates consolidations or pulmonary edema   The visualized portions of bronchi are grossly unremarkable without definitive evidence of bronchitis and/or bronchiectasis   No pulmonary nodules   Upper Abdomen:   Comparison with prior examinations demonstrates no significant change in the ill-defined low-attenuation soft tissue lesion involving the posterior margin of the right lobe of the liver along segment 8 measuring 5.3 x 2.5 cm   Musculoskeletal: No chest wall mass or suspicious bone lesions identified.   IMPRESSION: *Chronic interstitial lung disease without evidence of acute infiltrates or pulmonary edema. *This appears to be present on prior examination but appears better defined and slightly larger on today's examination. Could correlate with the thyroid nodule. *No significant change in the ill-defined low-attenuation soft tissue lesion involving the posterior margin of the right lobe of the liver along segment 8 measuring 5.3 x  2.5 cm. 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 Unchanged since prior examination. *1.7 x 1.5 cm isodense to thyroid nodular soft tissue density to the left and posterior to the trachea consistent perhaps with a thyroid nodule however, it appears to be deeply located to be properly image by ultrasound and follow-up CT with contrast is recommended for better characterization of the thyroid.     Electronically Signed   By: Shaaron Adler M.D.   On: 02/15/2024 09:45   PFT      No data to display             LAB RESULTS last 96 hours No results found.       has a past medical history of Allergy, Anemia, Asthma, GERD (gastroesophageal reflux disease), Hypertension, Hypothyroidism, IBS (irritable bowel syndrome), Migraine, Muscle cramp, Obesity, Plantar fasciitis, Rheumatoid arthritis (HCC), and Vitamin D deficiency.   reports that she has never smoked. She has never been exposed to tobacco smoke. She has never used smokeless tobacco.  Past Surgical History:  Procedure Laterality Date   COLONOSCOPY WITH PROPOFOL N/A 09/28/2018   Procedure: COLONOSCOPY WITH PROPOFOL;  Surgeon: Willis Modena, MD;  Location: [facility];  Service: Endoscopy;  Laterality: N/A;  POLYPECTOMY  09/28/2018   Procedure: POLYPECTOMY;  Surgeon: Willis Modena, MD;  Location: [facility];  Service: Endoscopy;;    Allergies  Allergen Reactions   Amoxicillin Hives   Codeine Itching   Levaquin [Levofloxacin]     Severe diarrhea   Penicillins Hives    Has patient had a PCN reaction causing immediate rash, facial/tongue/throat swelling, SOB or lightheadedness with hypotension: Yes Has patient had a PCN reaction causing severe rash involving mucus membranes or skin necrosis: no Has patient had a PCN reaction that required hospitalization: no Has patient had a PCN reaction occurring within the last 10 years: no If all of the  above answers are "NO", then may proceed with Cephalosporin use.    Prednisone Other (See Comments)    Severe cramps and "spasms"   Sulfa Antibiotics Other (See Comments)    Does not remember side effects   Latex Rash and Other (See Comments)    "Sometimes bothers me"    Immunization History  Administered Date(s) Administered   Influenza Split 09/14/2015   Influenza-Unspecified 09/14/2019   PFIZER(Purple Top)SARS-COV-2 Vaccination 02/19/2020    Family History  Problem Relation Age of Onset   Rheum arthritis Mother    Hypertension Mother    Diabetes Mother    Kidney disease Father    Colon cancer Father    Asthma Sister    Hypertension Sister    Hypercholesterolemia Sister    Sleep apnea Sister    Arthritis Sister    Diabetes Sister    Allergies Sister    Rheum arthritis Sister    Colon cancer Paternal Grandfather    Allergic rhinitis Neg Hx    Angioedema Neg Hx    Atopy Neg Hx    Eczema Neg Hx    Immunodeficiency Neg Hx    Urticaria Neg Hx      Current Outpatient Medications:    albuterol (PROVENTIL) (2.5 MG/3ML) 0.083% nebulizer solution, Take 3 mLs (2.5 mg total) by nebulization every 4 (four) hours as needed for wheezing or shortness of breath (coughing fits)., Disp: 150 mL, Rfl: 1   albuterol (VENTOLIN HFA) 108 (90 Base) MCG/ACT inhaler, Inhale 2 puffs into the lungs every 4 (four) hours as needed for wheezing or shortness of breath (coughing fits)., Disp: 18 g, Rfl: 1   Ascorbic Acid (VITAMIN C) 1000 MG tablet, 1 tablet, Disp: , Rfl:    benzonatate (TESSALON PERLES) 100 MG capsule, Take 2 capsules (200 mg total) by mouth 3 (three) times daily as needed for cough., Disp: 30 capsule, Rfl: 0   Budeson-Glycopyrrol-Formoterol (BREZTRI AEROSPHERE) 160-9-4.8 MCG/ACT AERO, Inhale 2 puffs into the lungs in the morning and at bedtime., Disp: 11 g, Rfl: 1   budesonide (PULMICORT) 0.5 MG/2ML nebulizer solution, Take 2 mLs (0.5 mg total) by nebulization in the morning and at  bedtime., Disp: 120 mL, Rfl: 1   Carbinoxamine Maleate 4 MG TABS, Take 1.5 tablets (6 mg total) by mouth in the morning and at bedtime., Disp: 60 tablet, Rfl: 2   carvedilol (COREG) 3.125 MG tablet, Take 3.125 mg by mouth 2 (two) times daily with a meal. , Disp: , Rfl:    chlorthalidone (HYGROTON) 25 MG tablet, 1 tablet in the morning with food, Disp: , Rfl:    Cholecalciferol (VITAMIN D3) 5000 units TABS, Take 5,000 Units by mouth daily. , Disp: , Rfl:    dextromethorphan 15 MG/5ML syrup, Take 10 mLs (30 mg total) by mouth 4 (four) times daily as needed for cough., Disp:  120 mL, Rfl: 1   EPINEPHrine 0.3 mg/0.3 mL IJ SOAJ injection, Inject 0.3 mg into the muscle as needed for anaphylaxis., Disp: 2 each, Rfl: 1   Eszopiclone 3 MG TABS, Take 3 mg by mouth at bedtime as needed (sleep). Take immediately before bedtime, Disp: , Rfl:    hydroxychloroquine (PLAQUENIL) 200 MG tablet, Take 200 mg by mouth daily. 2 tabs daily, Disp: , Rfl:    irbesartan (AVAPRO) 300 MG tablet, 1 tablet, Disp: , Rfl:    JOLESSA 0.15-0.03 MG tablet, Take 1 tablet by mouth daily., Disp: , Rfl:    Lifitegrast 5 % SOLN, Place 1 drop into both eyes 2 (two) times daily., Disp: , Rfl:    loperamide (IMODIUM) 2 MG capsule, Take 1 capsule (2 mg total) by mouth 4 (four) times daily as needed for diarrhea or loose stools., Disp: 20 capsule, Rfl: 0   metaxalone (SKELAXIN) 800 MG tablet, 1 tablet, Disp: , Rfl:    metroNIDAZOLE (METROCREAM) 0.75 % cream, Apply 1 application topically See admin instructions. , Disp: , Rfl:    montelukast (SINGULAIR) 10 MG tablet, Take 1 tablet (10 mg total) by mouth at bedtime., Disp: 90 tablet, Rfl: 1   naratriptan (AMERGE) 2.5 MG tablet, Take 2.5 mg by mouth as needed for migraine. , Disp: , Rfl: 5   NURTEC 75 MG TBDP, Take 1 tablet by mouth daily as needed., Disp: , Rfl:    ondansetron (ZOFRAN ODT) 4 MG disintegrating tablet, Take 1 tablet (4 mg total) by mouth every 8 (eight) hours as needed for nausea  or vomiting., Disp: 21 tablet, Rfl: 0   pantoprazole (PROTONIX) 40 MG tablet, Take 40 mg by mouth daily., Disp: , Rfl:    Respiratory Therapy Supplies (FLUTTER) DEVI, Use as directed, Disp: 1 each, Rfl: 0   Rimegepant Sulfate (NURTEC) 75 MG TBDP, , Disp: , Rfl:    Tezepelumab-ekko (TEZSPIRE) 210 MG/1. SOAJ, Inject 210 mg into the skin every 28 (twenty-eight) days., Disp: 1.91 mL, Rfl: 11   tiZANidine (ZANAFLEX) 2 MG tablet, Take 1 tablet (2 mg total) by mouth at bedtime as needed for muscle spasms., Disp: 30 tablet, Rfl: 0   traMADol (ULTRAM) 50 MG tablet, 1-2 every 4 hours as needed for cough or pain (Patient taking differently: Take 50-100 mg by mouth every 4 (four) hours as needed (pain). 1-2 every 4 hours as needed for cough or pain), Disp: 40 tablet, Rfl: 0   valACYclovir (VALTREX) 500 MG tablet, Take 500 mg by mouth daily., Disp: , Rfl:    venlafaxine XR (EFFEXOR-XR) 150 MG 24 hr capsule, Take 150 mg by mouth 2 (two) times daily with a meal. , Disp: , Rfl:    Vitamin D, Ergocalciferol, 50000 units CAPS, 1 capsule, Disp: , Rfl:       Objective:   Vitals:   02/29/24 0939  BP: 134/80  Pulse: 75  Temp: 98.3 F (36.8 C)  TempSrc: Oral  SpO2: 100%  Weight: (!) 361 lb (163.7 kg)  Height: 5\' 8"  (1.727 m)    Estimated body mass index is 54.89 kg/m as calculated from the following:   Height as of this encounter: 5\' 8"  (1.727 m).   Weight as of this encounter: 361 lb (163.7 kg).  @WEIGHTCHANGE @  Filed Weights   02/29/24 0939  Weight: (!) 361 lb (163.7 kg)     Physical Exam   General: No distress. OBESE. SHE COUGHS A LOT EVEN TAKING A DEEP BREATH O2 at rest: no [id number]  present: no Sitting in wheel chair: no Frail: no Obese: no Neuro: Alert and Oriented x 3. GCS 15. Speech normal Psych: Pleasant Resp:  Barrel Chest - no.  Wheeze - no, Crackles - no, No overt respiratory distress CVS: Normal heart sounds. Murmurs - no Ext: Stigmata of Connective Tissue Disease - o HEENT:  Normal upper airway. PEERL +. No post nasal drip        Assessment:       ICD-10-CM   1. 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 COVID-19 long hauler manifesting chronic dyspnea  R06.09 Ambulatory referral to ENT   U09.9 Ambulatory Referral to Neuro Rehab    ECHOCARDIOGRAM COMPLETE    6. Ground glass opacity present on imaging of lung  R91.8 Ambulatory referral to ENT    Ambulatory Referral to Neuro Rehab    ECHOCARDIOGRAM COMPLETE         Plan:     Patient Instructions     ICD-10-CM   1. Chronic cough  R05.3     2. 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 DOE (dyspnea on exertion)  R06.09     4. COVID-19 long hauler manifesting chronic cough  R05.3    U09.9     5. COVID-19 long hauler manifesting chronic dyspnea  R06.09    U09.9     6. Ground glass opacity present on imaging of lung  R91.8      # Chronic groundglass opacities that first occurred in 2021 after your COVID.  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 Selena Batten and our pharmacy team to make sure it is safe particularly in the presence of chronic kidney disease  #Symptoms of shortness of breath   -Only some of this is related to her pulmonary CT abnormalities but most of this is likely related to your weight and possible undiagnosed diastolic dysfunction  Plan - Encourage weight loss - Get echocardiogram  Follow-up - Return to see nurse practitioner in 8 weeks to follow-up    FOLLOWUP Return in about 8 weeks (around 04/25/2024) for with any of the APPS, Face to Face Visit.    SIGNATURE    Dr. Kalman Shan, M.D., F.C.C.P,  Pulmonary  and Critical Care Medicine Staff Physician, [facility] Center Director - Interstitial Lung Disease  Program  Pulmonary Fibrosis [facility] at [facility] [place], [place], [address]  Pager: [phone], If no answer or between  15:00h - 7:00h: call [phone] Telephone: [phone]  10:15 AM 02/29/2024

## 2024-02-29 NOTE — Patient Instructions (Addendum)
 ICD-10-CM   1. Chronic cough  R05.3     2. 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 DOE (dyspnea on exertion)  R06.09     4. COVID-19 long hauler manifesting chronic cough  R05.3    U09.9     5. COVID-19 long hauler manifesting chronic dyspnea  R06.09    U09.9     6. Ground glass opacity present on imaging of lung  R91.8      # Chronic groundglass opacities that first occurred in 2021 after your COVID.  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 Selena Batten and our pharmacy team to make sure it is safe particularly in the presence of chronic kidney disease  #Symptoms of shortness of breath   -Only some of this is related to her pulmonary CT abnormalities but most of this is likely related to your weight and possible undiagnosed diastolic dysfunction  Plan - Encourage weight loss - Get echocardiogram  Follow-up - Return to see nurse practitioner in 8 weeks to follow-up

## 2024-02-29 NOTE — Telephone Encounter (Signed)
 Brittany Riggs  Please tell Brittany Riggs know that both Dr Selena Batten and [facility] are ok with her trying gabapentin but I will go with much lower dose than standard dose. Normally I do 300mg  tid but I will just do 300mg  once a  night if that works for her  Thanks    DR Kim/DEvki - > thanks and reply not needed  SIGNATURE    Dr. Kalman Shan, M.D., F.C.C.P,  Pulmonary and Critical Care Medicine Staff Physician, [facility] Center Director - Interstitial Lung Disease  Program  Pulmonary Fibrosis [facility] at [facility] [place], [place], [address]   Pager: [phone], If no answer  -> Check AMION or Try [phone] Telephone (clinical office): [phone] Telephone (research): [phone]  5:10 PM 02/29/2024

## 2024-03-01 MED ORDER — GABAPENTIN 300 MG PO CAPS: 300.0000 mg | ORAL_CAPSULE | Freq: Every day | ORAL | 5 refills | Status: AC

## 2024-03-01 NOTE — Telephone Encounter (Signed)
 Spoke with the pt and notified of response from Dr. Marchelle Gearing  Pt verbalized understanding  Rx was sent to pharm  Nothing further needed

## 2024-03-01 NOTE — Telephone Encounter (Signed)
 I called the pt and there was no answer- LMTCB. ?

## 2024-03-08 ENCOUNTER — Ambulatory Visit: Payer: Managed Care, Other (non HMO) | Admitting: Allergy

## 2024-03-10 ENCOUNTER — Encounter: Payer: Self-pay | Admitting: Allergy

## 2024-03-15 ENCOUNTER — Ambulatory Visit: Admitting: Allergy

## 2024-03-26 NOTE — Progress Notes (Unsigned)
 Follow Up Note  RE: Brittany Riggs MRN: [id number] DOB: May 13, 1971 Date of Office Visit: 03/27/2024  Referring provider: Victorio Grave, MD Primary care provider: Victorio Grave, MD  Chief Complaint: No chief complaint on file.  History of Present Illness: I had the pleasure of seeing Brittany Riggs for a follow up visit [facility]  on 03/26/2024. She is a 53 y.o. female, who is being followed for chronic coughing, recurrent infections, asthma on tezspire, allergic rhinitis on AIT, food allergy, GERD. Her previous allergy office visit was on 12/29/2023 with Dr. Burdette Carolin. Today is a regular follow up visit.  Discussed the use of AI scribe software for clinical note transcription with the patient, who gave verbal consent to proceed.      2025 labs: "Your blood count, immunoglobulin levels were normal which is great. You also have good protection against diptheria and tetanus.   However, your pneumococcal titers were low. Sometimes people with low titers are more likely to develop respiratory infections caused by the bacteria strep pneumoniae. I would like for you to get the pneumovax 23 vaccine (also known as the pneumonia shot) as it can boost the levels and offer protection against this bacteria in the future. Once you get the vaccine, we check the levels 4 weeks afterwards to make sure your immune system responded to the vaccine appropriately. You can get the pneumovax vaccine at your PCP's office or pharmacy. If they don't offer it there, let us  know and in certain cases we have given them in our office.  Make sure you get the pneumovax shot.   Your tryptase (checks for mast cell issues) and alpha-1 antitrypsin level (checks for a specific lung disease) were normal which is great.    Your kidney numbers and liver numbers were slightly elevated - but it was like this before and some of the numbers were worse previously. No further work up needed. Need to monitor  at least once a year.   Make sure you get CT chest after you finished with your antibiotics. 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 Burdette Carolin and our pharmacy team to make sure it is safe particularly in the presence of chronic kidney disease   #Symptoms of shortness of breath    -Only some of this is related to her pulmonary CT abnormalities but most of this is likely related to your weight and possible undiagnosed diastolic dysfunction   Plan - Encourage weight loss - Get echocardiogram"  02/15/2024 CT chest: "IMPRESSION: *Chronic interstitial lung disease without evidence of acute infiltrates or pulmonary edema. *This appears to be present  on prior examination but appears better defined and slightly larger on today's examination. Could correlate with the thyroid nodule. *No significant change in the ill-defined low-attenuation soft tissue lesion involving the posterior margin of the right lobe of the liver along segment 8 measuring 5.3 x 2.5 cm. 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 Unchanged since prior examination. *1.7 x 1.5 cm isodense to thyroid nodular soft tissue density to the left and posterior to the trachea consistent  perhaps with a thyroid nodule however, it appears to be deeply located to be properly image by ultrasound and follow-up CT with contrast is recommended for better characterization of the thyroid."  Assessment and Plan: Brittany Riggs is a 53 y.o. female with: Chronic coughing Persistent cough despite treatment with [place], Singulair, and nebulized albuterol and budesonide. Cough flared with recent upper respiratory infection treated with Z-Pak and Tessalon Pearls. Yellow sputum production. Prior CT chest in 2021 showed ground glass opacities and bronchiectasis.  Letter written - to be excused for work this week. Refer to ENT for chronic coughing.  Get CT scan of chest - get after done with antibiotics.  Take hycodan 5mL every 6 hours as needed for coughing. Printed Rx for this.  Recurrent infections Take doxycycline 100mg  twice a day for 10 day.s Diflucan sent in. Take with probiotics.  Get bloodwork to look at immune system.  Not well controlled severe persistent asthma Started Tezspire with some marginal benefit per patient.  Depo 80mg  IM given today. Daily controller medication(s):  Continue Breztri 2 puffs twice a day with spacer and rinse mouth afterwards. Continue Singulair (montelukast) 10mg  daily at night. Tezspire injections every 4 weeks.  During respiratory infections/asthma flares:  Start Pulmicort 0.5mg  nebulizer twice a day for 1-2 weeks until your breathing symptoms return to baseline.  Pretreat with albuterol nebulizer.  If you need to use your albuterol nebulizer machine back to back within 15-30 minutes with no relief then please go to the ER/urgent care for further evaluation.  Get spirometry at next visit.  Anaphylactic reaction due to food, subsequent encounter Past history - Sesame causes itching. 2021 bloodwork borderline positive to shellfish.  Continue to avoid shellfish and sesame. For mild symptoms you can take over the counter antihistamines such as  Benadryl and monitor symptoms closely. If symptoms worsen or if you have severe symptoms including breathing issues, throat closure, significant swelling, whole body hives, severe diarrhea and vomiting, lightheadedness then inject epinephrine and seek immediate medical care afterwards.  Seasonal allergic rhinitis due to pollen Allergic rhinitis due to animal dander Allergic rhinitis due to dust mite Allergic rhinitis due to mold Allergy to cockroaches Past history - started AIT on 07/02/2016 (G/Weeds/T + M/DM/C/CR). Nasal sprays cause epistaxis. Patient has dry eyes.  Continue allergy injections - wait until you are feeling better to get it.  Continue environmental control measures.  Continue Singulair (montelukast) 10mg  daily at night. Take carbinoxamine 6mg  twice a day.  May use nasal saline spray (i.e., Simply Saline) as needed.  Gastroesophageal reflux disease, unspecified whether esophagitis present Continue lifestyle and dietary modifications. Take pantoprazole 40mg  twice a day. Nothing to eat or drink for 30 min afterwards.   Elevated blood pressure Follow up with PCP regarding this   No follow-ups on file.  No orders of the defined types were placed in this encounter.  Lab Orders  No laboratory test(s) ordered today     Diagnostics: None.   Medication List:  Current Outpatient Medications  Medication Sig Dispense Refill   albuterol (PROVENTIL) (2.5 MG/3ML) 0.083% nebulizer solution Take 3 mLs (2.5 mg total) by nebulization every 4 (four) hours as needed for wheezing or shortness of breath (coughing fits). 150 mL 1   albuterol (VENTOLIN HFA) 108 (90 Base) MCG/ACT inhaler Inhale 2 puffs into the lungs every 4 (four) hours as needed for wheezing or shortness of breath (coughing fits). 18 g 1   Ascorbic Acid (VITAMIN C) 1000 MG tablet 1 tablet     benzonatate (TESSALON PERLES) 100 MG capsule Take 2 capsules (200 mg total) by mouth 3 (three) times daily as needed for cough.  30 capsule 0   Budeson-Glycopyrrol-Formoterol (BREZTRI AEROSPHERE) 160-9-4.8 MCG/ACT AERO Inhale 2 puffs into the lungs in the morning and at bedtime. 11 g 1   budesonide (PULMICORT) 0.5 MG/2ML nebulizer solution Take 2 mLs (0.5 mg total) by nebulization in the morning and at bedtime. 120 mL 1   Carbinoxamine Maleate 4 MG TABS Take 1.5 tablets (6 mg total) by mouth in the morning and at bedtime. 60 tablet 2   carvedilol (COREG) 3.125 MG tablet Take 3.125 mg by mouth 2 (two) times daily with a meal.      chlorthalidone (HYGROTON) 25 MG tablet 1 tablet in the morning with food     Cholecalciferol (VITAMIN D3) 5000 units TABS Take 5,000 Units by mouth daily.      dextromethorphan 15 MG/5ML syrup Take 10 mLs (30 mg total) by mouth 4 (four) times daily as needed for cough. 120 mL 1   EPINEPHrine 0.3 mg/0.3 mL IJ SOAJ injection Inject 0.3 mg into the muscle as needed for anaphylaxis. 2 each 1   Eszopiclone 3 MG TABS Take 3 mg by mouth at bedtime as needed (sleep). Take immediately before bedtime     gabapentin (NEURONTIN) 300 MG capsule Take 1 capsule (300 mg total) by mouth at bedtime. 30 capsule 5   hydroxychloroquine (PLAQUENIL) 200 MG tablet Take 200 mg by mouth daily. 2 tabs daily     irbesartan (AVAPRO) 300 MG tablet 1 tablet     JOLESSA 0.15-0.03 MG tablet Take 1 tablet by mouth daily.     Lifitegrast 5 % SOLN Place 1 drop into both eyes 2 (two) times daily.     loperamide (IMODIUM) 2 MG capsule Take 1 capsule (2 mg total) by mouth 4 (four) times daily as needed for diarrhea or loose stools. 20 capsule 0   metaxalone (SKELAXIN) 800 MG tablet 1 tablet     metroNIDAZOLE (METROCREAM) 0.75 % cream Apply 1 application topically See admin instructions.      montelukast (SINGULAIR) 10 MG tablet Take 1 tablet (10 mg total) by mouth at bedtime. 90 tablet 1   naratriptan (AMERGE) 2.5 MG tablet Take 2.5 mg by mouth as needed for migraine.   5   NURTEC 75 MG TBDP Take 1 tablet by mouth daily as needed.      ondansetron (ZOFRAN ODT) 4 MG disintegrating tablet Take 1 tablet (4 mg total) by mouth every 8 (eight) hours as needed for nausea or vomiting. 21 tablet 0   pantoprazole (PROTONIX) 40 MG tablet Take 40 mg by mouth daily.     Respiratory Therapy Supplies (FLUTTER) DEVI Use as directed 1 each 0   Rimegepant Sulfate (NURTEC) 75 MG TBDP      Tezepelumab-ekko (TEZSPIRE) 210 MG/1. SOAJ Inject 210 mg into the skin every 28 (twenty-eight) days. 1.91 mL 11   tiZANidine (ZANAFLEX) 2  MG tablet Take 1 tablet (2 mg total) by mouth at bedtime as needed for muscle spasms. 30 tablet 0   traMADol (ULTRAM) 50 MG tablet 1-2 every 4 hours as needed for cough or pain (Patient taking differently: Take 50-100 mg by mouth every 4 (four) hours as needed (pain). 1-2 every 4 hours as needed for cough or pain) 40 tablet 0   valACYclovir (VALTREX) 500 MG tablet Take 500 mg by mouth daily.     venlafaxine XR (EFFEXOR-XR) 150 MG 24 hr capsule Take 150 mg by mouth 2 (two) times daily with a meal.      Vitamin D, Ergocalciferol, 50000 units CAPS 1 capsule     No current facility-administered medications for this visit.   Allergies: Allergies  Allergen Reactions   Amoxicillin Hives   Codeine Itching   Levaquin [Levofloxacin]     Severe diarrhea   Penicillins Hives    Has patient had a PCN reaction causing immediate rash, facial/tongue/throat swelling, SOB or lightheadedness with hypotension: Yes Has patient had a PCN reaction causing severe rash involving mucus membranes or skin necrosis: no Has patient had a PCN reaction that required hospitalization: no Has patient had a PCN reaction occurring within the last 10 years: no If all of the above answers are "NO", then may proceed with Cephalosporin use.    Prednisone Other (See Comments)    Severe cramps and "spasms"   Sulfa Antibiotics Other (See Comments)    Does not remember side effects   Latex Rash and Other (See Comments)    "Sometimes bothers me"   I  reviewed her past medical history, social history, family history, and environmental history and no significant changes have been reported from her previous visit.  Review of Systems  Constitutional:  Negative for appetite change, chills, fever and unexpected weight change.  HENT:  Negative for congestion, rhinorrhea and sneezing.   Eyes:  Negative for itching.  Respiratory:  Positive for cough, chest tightness and shortness of breath. Negative for wheezing.   Gastrointestinal:  Negative for abdominal pain.  Skin:  Negative for rash.  Allergic/Immunologic: Positive for environmental allergies and food allergies.  Neurological:  Negative for headaches.    Objective: There were no vitals taken for this visit. There is no height or weight on file to calculate BMI. Physical Exam Vitals and nursing note reviewed.  Constitutional:      Appearance: Normal appearance. She is well-developed. She is obese.  HENT:     Head: Normocephalic and atraumatic.     Right Ear: Tympanic membrane and external ear normal.     Left Ear: Tympanic membrane and external ear normal.     Nose: Nose normal.     Mouth/Throat:     Mouth: Mucous membranes are moist.     Pharynx: Oropharynx is clear.  Eyes:     Conjunctiva/sclera: Conjunctivae normal.  Cardiovascular:     Rate and Rhythm: Normal rate and regular rhythm.     Heart sounds: Normal heart sounds. No murmur heard. Pulmonary:     Effort: Pulmonary effort is normal.     Breath sounds: Normal breath sounds. No wheezing, rhonchi or rales.     Comments: Dry coughing during visit. Musculoskeletal:     Cervical back: Neck supple.  Skin:    General: Skin is warm.     Findings: No rash.  Neurological:     Mental Status: She is alert and oriented to person, place, and time.  Psychiatric:  Behavior: Behavior normal.    Previous notes and tests were reviewed. The plan was reviewed with the patient/family, and all questions/concerned were  addressed.  It was my pleasure to see Brittany Riggs today and participate in her care. Please feel free to contact me with any questions or concerns.  Sincerely,  Eudelia Hero, DO Allergy & Immunology  [facility]  [place] office: [phone] [facility] office: [phone]

## 2024-03-27 ENCOUNTER — Encounter: Payer: Self-pay | Admitting: Allergy

## 2024-03-27 ENCOUNTER — Ambulatory Visit (INDEPENDENT_AMBULATORY_CARE_PROVIDER_SITE_OTHER): Admitting: Allergy

## 2024-03-27 ENCOUNTER — Other Ambulatory Visit: Payer: Self-pay

## 2024-03-27 VITALS — BP 120/76 | HR 66 | Temp 96.3°F | Resp 20

## 2024-03-27 DIAGNOSIS — R053 Chronic cough: Secondary | ICD-10-CM

## 2024-03-27 DIAGNOSIS — B999 Unspecified infectious disease: Secondary | ICD-10-CM

## 2024-03-27 DIAGNOSIS — T7800XD Anaphylactic reaction due to unspecified food, subsequent encounter: Secondary | ICD-10-CM

## 2024-03-27 DIAGNOSIS — J4550 Severe persistent asthma, uncomplicated: Secondary | ICD-10-CM | POA: Diagnosis not present

## 2024-03-27 DIAGNOSIS — J301 Allergic rhinitis due to pollen: Secondary | ICD-10-CM

## 2024-03-27 DIAGNOSIS — J3089 Other allergic rhinitis: Secondary | ICD-10-CM

## 2024-03-27 DIAGNOSIS — K219 Gastro-esophageal reflux disease without esophagitis: Secondary | ICD-10-CM

## 2024-03-27 DIAGNOSIS — J309 Allergic rhinitis, unspecified: Secondary | ICD-10-CM

## 2024-03-27 DIAGNOSIS — Z91038 Other insect allergy status: Secondary | ICD-10-CM

## 2024-03-27 DIAGNOSIS — J3081 Allergic rhinitis due to animal (cat) (dog) hair and dander: Secondary | ICD-10-CM

## 2024-03-27 MED ORDER — RYALTRIS 665-25 MCG/ACT NA SUSP: NASAL | Status: DC

## 2024-03-27 MED ORDER — RYALTRIS 665-25 MCG/ACT NA SUSP
1.0000 | Freq: Two times a day (BID) | NASAL | 5 refills | Status: AC
Start: 2024-03-27 — End: ?

## 2024-03-27 NOTE — Patient Instructions (Addendum)
 Coughing SEE ENT for chronic coughing.  Continue with gabapentin. Follow up with pulmonology as scheduled.  Get echocardiogram as scheduled in May.   Asthma Daily controller medication(s):  Continue Breztri 2 puffs twice a day with spacer and rinse mouth afterwards. Continue Singulair (montelukast) 10mg  daily at night. Tezspire injections every 4 weeks.  During respiratory infections/asthma flares:  Start Pulmicort 0.5mg  nebulizer twice a day for 1-2 weeks until your breathing symptoms return to baseline.  Pretreat with albuterol nebulizer.  If you need to use your albuterol nebulizer machine back to back within 15-30 minutes with no relief then please go to the ER/urgent care for further evaluation.  Asthma control goals:  Full participation in all desired activities (may need albuterol before activity) Albuterol use two times or less a week on average (not counting use with activity) Cough interfering with sleep two times or less a month Oral steroids no more than once a year No hospitalizations   Seasonal and perennial allergic rhinitis Continue allergy injections - given today.  Continue environmental control measures.  Continue Singulair (montelukast) 10mg  daily at night. Use over the counter antihistamines such as Zyrtec (cetirizine), Claritin (loratadine), Allegra (fexofenadine), or Xyzal (levocetirizine) daily as needed. May take twice a day during allergy flares. May switch antihistamines every few months. Start Ryaltris (olopatadine + mometasone nasal spray combination) 1 spray per nostril once a day at night. Sample given. If it causes nosebleeds then stop and let me know. If this works well for you, then have pharmacy ship the medication to your home - prescription already sent in.  Nasal saline spray (i.e., Simply Saline) or nasal saline lavage (i.e., NeilMed) is recommended as needed and prior to medicated nasal sprays.   Food allergy Continue to avoid shellfish and  sesame. For mild symptoms you can take over the counter antihistamines such as Benadryl and monitor symptoms closely. If symptoms worsen or if you have severe symptoms including breathing issues, throat closure, significant swelling, whole body hives, severe diarrhea and vomiting, lightheadedness then inject epinephrine and seek immediate medical care afterwards.  Heartburn Continue lifestyle and dietary modifications. Take pantoprazole 40mg  twice a day. Nothing to eat or drink for 30 min afterwards.   Infections Keep track of infections and antibiotics use. Get pneumovax 23 vaccine.  Follow up in 3-4 months or sooner if needed.

## 2024-03-27 NOTE — Progress Notes (Signed)
 Medication Samples have been provided to the patient.  Drug name: Ryaltris       Strength:        Qty: 1  LOT: [id number]  Exp.Date: 03/13/2025  Dosing instructions: 1-2 spray per nostril once a day at night.   The patient has been instructed regarding the correct time, dose, and frequency of taking this medication, including desired effects and most common side effects.   Brittany Riggs 4:42 PM 03/27/2024

## 2024-03-29 ENCOUNTER — Ambulatory Visit (HOSPITAL_COMMUNITY)

## 2024-04-05 ENCOUNTER — Ambulatory Visit (INDEPENDENT_AMBULATORY_CARE_PROVIDER_SITE_OTHER): Payer: Self-pay

## 2024-04-05 DIAGNOSIS — J309 Allergic rhinitis, unspecified: Secondary | ICD-10-CM

## 2024-04-24 ENCOUNTER — Encounter: Payer: Self-pay | Admitting: Allergy

## 2024-04-24 ENCOUNTER — Other Ambulatory Visit: Payer: Self-pay

## 2024-04-24 MED ORDER — BREZTRI AEROSPHERE 160-9-4.8 MCG/ACT IN AERO: 2.0000 | INHALATION_SPRAY | Freq: Two times a day (BID) | RESPIRATORY_TRACT | 3 refills | Status: AC

## 2024-04-26 ENCOUNTER — Ambulatory Visit (INDEPENDENT_AMBULATORY_CARE_PROVIDER_SITE_OTHER)

## 2024-04-26 DIAGNOSIS — J309 Allergic rhinitis, unspecified: Secondary | ICD-10-CM | POA: Diagnosis not present

## 2024-05-03 ENCOUNTER — Ambulatory Visit: Admitting: Primary Care

## 2024-05-10 ENCOUNTER — Ambulatory Visit (HOSPITAL_COMMUNITY)

## 2024-05-16 ENCOUNTER — Encounter (HOSPITAL_COMMUNITY): Payer: Self-pay | Admitting: Internal Medicine

## 2024-05-22 ENCOUNTER — Ambulatory Visit: Payer: 59 | Admitting: Dermatology

## 2024-06-19 ENCOUNTER — Ambulatory Visit (INDEPENDENT_AMBULATORY_CARE_PROVIDER_SITE_OTHER)

## 2024-06-19 DIAGNOSIS — J309 Allergic rhinitis, unspecified: Secondary | ICD-10-CM | POA: Diagnosis not present

## 2024-06-27 ENCOUNTER — Other Ambulatory Visit: Payer: Self-pay | Admitting: *Deleted

## 2024-06-27 ENCOUNTER — Ambulatory Visit (INDEPENDENT_AMBULATORY_CARE_PROVIDER_SITE_OTHER)

## 2024-06-27 DIAGNOSIS — J309 Allergic rhinitis, unspecified: Secondary | ICD-10-CM

## 2024-06-27 MED ORDER — TEZSPIRE 210 MG/1.91ML ~~LOC~~ SOAJ: 210.0000 mg | SUBCUTANEOUS | 11 refills | Status: AC

## 2024-07-04 ENCOUNTER — Ambulatory Visit (HOSPITAL_COMMUNITY)
Admission: RE | Admit: 2024-07-04 | Discharge: 2024-07-04 | Disposition: A | Discharge status: Home / Self Care | Source: Ambulatory Visit | Attending: Cardiology | Admitting: Cardiology

## 2024-07-04 DIAGNOSIS — R918 Other nonspecific abnormal finding of lung field: Secondary | ICD-10-CM | POA: Insufficient documentation

## 2024-07-04 DIAGNOSIS — J387 Other diseases of larynx: Secondary | ICD-10-CM | POA: Diagnosis present

## 2024-07-04 DIAGNOSIS — R053 Chronic cough: Secondary | ICD-10-CM | POA: Diagnosis present

## 2024-07-04 DIAGNOSIS — U099 Post covid-19 condition, unspecified: Secondary | ICD-10-CM | POA: Diagnosis present

## 2024-07-04 DIAGNOSIS — R0609 Other forms of dyspnea: Secondary | ICD-10-CM | POA: Diagnosis present

## 2024-07-04 LAB — ECHOCARDIOGRAM COMPLETE
AR max vel: 2.75 cm2
AV Area VTI: 3.27 cm2
AV Area mean vel: 3.11 cm2
AV Mean grad: 6 mmHg
AV Peak grad: 11 mmHg
Ao pk vel: 1.66 m/s
Area-P 1/2: 4.15 cm2
S' Lateral: 3.2 cm

## 2024-07-04 MED ORDER — PERFLUTREN LIPID MICROSPHERE
1.0000 mL | INTRAVENOUS | Status: AC | PRN
  Administered 2024-07-04: 2 mL via INTRAVENOUS

## 2024-07-05 ENCOUNTER — Ambulatory Visit (HOSPITAL_COMMUNITY)

## 2024-07-06 ENCOUNTER — Ambulatory Visit (INDEPENDENT_AMBULATORY_CARE_PROVIDER_SITE_OTHER)

## 2024-07-06 DIAGNOSIS — J309 Allergic rhinitis, unspecified: Secondary | ICD-10-CM

## 2024-07-12 ENCOUNTER — Ambulatory Visit (INDEPENDENT_AMBULATORY_CARE_PROVIDER_SITE_OTHER)

## 2024-07-12 ENCOUNTER — Encounter: Payer: Self-pay | Admitting: Primary Care

## 2024-07-12 ENCOUNTER — Ambulatory Visit (INDEPENDENT_AMBULATORY_CARE_PROVIDER_SITE_OTHER): Admitting: Primary Care

## 2024-07-12 VITALS — BP 132/97 | HR 104 | Temp 97.1°F | Ht 69.0 in | Wt 372.6 lb

## 2024-07-12 DIAGNOSIS — R918 Other nonspecific abnormal finding of lung field: Secondary | ICD-10-CM | POA: Diagnosis not present

## 2024-07-12 DIAGNOSIS — R053 Chronic cough: Secondary | ICD-10-CM

## 2024-07-12 DIAGNOSIS — J309 Allergic rhinitis, unspecified: Secondary | ICD-10-CM

## 2024-07-12 LAB — SEDIMENTATION RATE: Sed Rate: 44 mm/h — ABNORMAL HIGH (ref 0–30)

## 2024-07-12 NOTE — Progress Notes (Deleted)
 @Patient  ID: Brittany Riggs, female    DOB: 25-Apr-1971, 53 y.o.   MRN: [id number]  Chief Complaint  Patient presents with   Follow-up    Echo results     Referring provider: Teresa Channel, MD  HPI:   Allergies  Allergen Reactions   Amoxicillin Hives   Codeine Itching   Levaquin [Levofloxacin]     Severe diarrhea   Penicillins Hives    Has patient had a PCN reaction causing immediate rash, facial/tongue/throat swelling, SOB or lightheadedness with hypotension: Yes Has patient had a PCN reaction causing severe rash involving mucus membranes or skin necrosis: no Has patient had a PCN reaction that required hospitalization: no Has patient had a PCN reaction occurring within the last 10 years: no If all of the above answers are NO, then may proceed with Cephalosporin use.    Prednisone  Other (See Comments)    Severe cramps and spasms   Sulfa Antibiotics Other (See Comments)    Does not remember side effects   Latex Rash and Other (See Comments)    Sometimes bothers me    Immunization History  Administered Date(s) Administered   Influenza Split 09/14/2015   Influenza-Unspecified 09/14/2019   PFIZER(Purple Top)SARS-COV-2 Vaccination 02/19/2020    Past Medical History:  Diagnosis Date   Allergy     Anemia    Asthma    GERD (gastroesophageal reflux disease)    Hypertension    Hypothyroidism    IBS (irritable bowel syndrome)    Migraine    Muscle cramp    Obesity    Plantar fasciitis    Rheumatoid arthritis (HCC)    Vitamin D  deficiency     Tobacco History: Social History   Tobacco Use  Smoking Status Never   Passive exposure: Never  Smokeless Tobacco Never   Counseling given: Not Answered   Outpatient Medications Prior to Visit  Medication Sig Dispense Refill   albuterol  (PROVENTIL ) (2.5 MG/3ML) 0.083% nebulizer solution Take 3 mLs (2.5 mg total) by nebulization every 4 (four) hours as needed for wheezing or shortness of breath (coughing  fits). 150 mL 1   albuterol  (VENTOLIN  HFA) 108 (90 Base) MCG/ACT inhaler Inhale 2 puffs into the lungs every 4 (four) hours as needed for wheezing or shortness of breath (coughing fits). 18 g 1   Ascorbic Acid  (VITAMIN C) 1000 MG tablet 1 tablet     benzonatate  (TESSALON  PERLES) 100 MG capsule Take 2 capsules (200 mg total) by mouth 3 (three) times daily as needed for cough. 30 capsule 0   budeson-glycopyrrolate-formoterol  (BREZTRI  AEROSPHERE) 160-9-4.8 MCG/ACT AERO inhaler Inhale 2 puffs into the lungs in the morning and at bedtime. Rinse mouth out afterwards. 11 g 3   budesonide  (PULMICORT ) 0.5 MG/2ML nebulizer solution Take 2 mLs (0.5 mg total) by nebulization in the morning and at bedtime. 120 mL 1   carvedilol  (COREG ) 3.125 MG tablet Take 3.125 mg by mouth 2 (two) times daily with a meal.      chlorthalidone  (HYGROTON ) 25 MG tablet 1 tablet in the morning with food     Cholecalciferol (VITAMIN D3) 5000 units TABS Take 5,000 Units by mouth daily.      dextromethorphan  15 MG/5ML syrup Take 10 mLs (30 mg total) by mouth 4 (four) times daily as needed for cough. 120 mL 1   EPINEPHrine  0.3 mg/0.3 mL IJ SOAJ injection Inject 0.3 mg into the muscle as needed for anaphylaxis. 2 each 1   Eszopiclone 3 MG TABS Take 3 mg by  mouth at bedtime as needed (sleep). Take immediately before bedtime     gabapentin  (NEURONTIN ) 300 MG capsule Take 1 capsule (300 mg total) by mouth at bedtime. 30 capsule 5   hydroxychloroquine  (PLAQUENIL ) 200 MG tablet Take 200 mg by mouth daily. 2 tabs daily     irbesartan (AVAPRO) 300 MG tablet 1 tablet     JOLESSA 0.15-0.03 MG tablet Take 1 tablet by mouth daily.     Lifitegrast 5 % SOLN Place 1 drop into both eyes 2 (two) times daily.     loperamide  (IMODIUM ) 2 MG capsule Take 1 capsule (2 mg total) by mouth 4 (four) times daily as needed for diarrhea or loose stools. 20 capsule 0   metaxalone  (SKELAXIN ) 800 MG tablet 1 tablet     metroNIDAZOLE (METROCREAM) 0.75 % cream Apply 1  application topically See admin instructions.      montelukast  (SINGULAIR ) 10 MG tablet Take 1 tablet (10 mg total) by mouth at bedtime. 90 tablet 1   naratriptan (AMERGE) 2.5 MG tablet Take 2.5 mg by mouth as needed for migraine.   5   NURTEC 75 MG TBDP Take 1 tablet by mouth daily as needed.     Olopatadine -Mometasone  (RYALTRIS ) 665-25 MCG/ACT SUSP Place 1-2 sprays into the nose in the morning and at bedtime. 29 g 5   Olopatadine -Mometasone  (RYALTRIS ) 665-25 MCG/ACT SUSP 1-2 sprays per nostril once a day at night.     ondansetron  (ZOFRAN  ODT) 4 MG disintegrating tablet Take 1 tablet (4 mg total) by mouth every 8 (eight) hours as needed for nausea or vomiting. 21 tablet 0   pantoprazole  (PROTONIX ) 40 MG tablet Take 40 mg by mouth daily.     Respiratory Therapy Supplies (FLUTTER) DEVI Use as directed 1 each 0   Rimegepant Sulfate (NURTEC) 75 MG TBDP      Tezepelumab -ekko (TEZSPIRE ) 210 MG/1. SOAJ Inject 210 mg into the skin every 28 (twenty-eight) days. 1.91 mL 11   tiZANidine  (ZANAFLEX ) 2 MG tablet Take 1 tablet (2 mg total) by mouth at bedtime as needed for muscle spasms. 30 tablet 0   traMADol  (ULTRAM ) 50 MG tablet 1-2 every 4 hours as needed for cough or pain (Patient taking differently: Take 50-100 mg by mouth every 4 (four) hours as needed (pain). 1-2 every 4 hours as needed for cough or pain) 40 tablet 0   valACYclovir (VALTREX) 500 MG tablet Take 500 mg by mouth daily.     venlafaxine  XR (EFFEXOR -XR) 150 MG 24 hr capsule Take 150 mg by mouth 2 (two) times daily with a meal.      Vitamin D , Ergocalciferol , 50000 units CAPS 1 capsule     No facility-administered medications prior to visit.      Review of Systems  Review of Systems   Physical Exam  BP (!) 132/97 (BP Location: Left Arm, Patient Position: Sitting, Cuff Size: Large)   Pulse (!) 104   Temp (!) 97.1 F (36.2 C) (Temporal)   Ht 5' 9 (1.753 m)   Wt (!) 372 lb 9.6 oz (169 kg)   SpO2 96%   BMI 55.02 kg/m   Physical Exam   Lab Results:  CBC    Component Value Date/Time   WBC 5.6 12/29/2023 1212   WBC 23.2 (H) 09/06/2020 0430   RBC 3.84 12/29/2023 1212   RBC 3.09 (L) 09/06/2020 0430   HGB 12.7 12/29/2023 1212   HCT 39.2 12/29/2023 1212   PLT 365 12/29/2023 1212   MCV 102 (H)  12/29/2023 1212   MCH 33.1 (H) 12/29/2023 1212   MCH 32.4 09/06/2020 0430   MCHC 32.4 12/29/2023 1212   MCHC 33.0 09/06/2020 0430   RDW 12.8 12/29/2023 1212   LYMPHSABS 1.4 12/29/2023 1212   MONOABS 2.0 (H) 09/06/2020 0430   EOSABS 0.0 12/29/2023 1212   BASOSABS 0.0 12/29/2023 1212    BMET    Component Value Date/Time   NA 138 12/29/2023 1212   K 4.1 12/29/2023 1212   CL 100 12/29/2023 1212   CO2 23 12/29/2023 1212   GLUCOSE 84 12/29/2023 1212   GLUCOSE 113 (H) 09/06/2020 0430   BUN 20 12/29/2023 1212   CREATININE 1.59 (H) 12/29/2023 1212   CALCIUM 9.3 12/29/2023 1212   GFRNONAA 44 (L) 09/06/2020 0430   GFRAA 51 (L) 09/06/2020 0430    BNP No results found for: BNP  ProBNP No results found for: PROBNP  Imaging: ECHOCARDIOGRAM COMPLETE Result Date: 07/04/2024    ECHOCARDIOGRAM REPORT   Patient Name:   Brittany Riggs Date of Exam: 07/04/2024 Medical Rec #:  [id number]         Height:       68.0 in Accession #:    [id number]        Weight:       361.0 lb Date of Birth:  [birth year]         BSA:          2.627 m Patient Age:    52 years          BP:           148/112 mmHg Patient Gender: F                 HR:           84 bpm. Exam Location:  [address] Procedure: 2D Echo, Cardiac Doppler, Color Doppler and Intracardiac            Opacification Agent (Both Spectral and Color Flow Doppler were            utilized during procedure). Indications:    Chronic Cough R05.3                 DOE R06.09  History:        Patient has no prior history of Echocardiogram examinations.                 Signs/Symptoms:Shortness of Breath and Cough; Risk                 Factors:Hypertension.  Sonographer:    Cherene Ravens RDCS Referring Phys: [id number] MURALI RAMASWAMY IMPRESSIONS  1. Left ventricular ejection fraction, by estimation, is 60 to 65%. The left ventricle has normal function. The left ventricle has no regional wall motion abnormalities. There is mild left ventricular hypertrophy. Left ventricular diastolic parameters are consistent with Grade I diastolic dysfunction (impaired relaxation).  2. Right ventricular systolic function is normal. The right ventricular size is normal.  3. The mitral valve is normal in structure. Trivial mitral valve regurgitation. No evidence of mitral stenosis.  4. The aortic valve is tricuspid. Aortic valve regurgitation is not visualized. No aortic stenosis is present. Comparison(s): No prior Echocardiogram. FINDINGS  Left Ventricle: Left ventricular ejection fraction, by estimation, is 60 to 65%. The left ventricle has normal function. The left ventricle has no regional wall motion abnormalities. Definity  contrast agent was given IV to delineate the left ventricular  endocardial borders. The left ventricular internal  cavity size was normal in size. There is mild left ventricular hypertrophy. Left ventricular diastolic parameters are consistent with Grade I diastolic dysfunction (impaired relaxation). Right Ventricle: The right ventricular size is normal. Right ventricular systolic function is normal. Left Atrium: Left atrial size was normal in size. Right Atrium: Right atrial size was normal in size. Pericardium: Trivial pericardial effusion is present. Mitral Valve: The mitral valve is normal in structure. Trivial mitral valve regurgitation. No evidence of mitral valve stenosis. Tricuspid Valve: The tricuspid valve is normal in structure. Tricuspid valve regurgitation is trivial. No evidence of tricuspid stenosis. Aortic Valve: The aortic valve is tricuspid. Aortic valve regurgitation is not visualized. No aortic stenosis is present. Aortic valve mean gradient measures 6.0 mmHg. Aortic valve  peak gradient measures 11.0 mmHg. Aortic valve area, by VTI measures 3.27  cm. Pulmonic Valve: The pulmonic valve was normal in structure. Pulmonic valve regurgitation is not visualized. No evidence of pulmonic stenosis. Aorta: The aortic root is normal in size and structure. Venous: The inferior vena cava was not well visualized. IAS/Shunts: The interatrial septum was not well visualized.  LEFT VENTRICLE PLAX 2D LVIDd:         4.30 cm   Diastology LVIDs:         3.20 cm   LV e' medial:    7.62 cm/s LV PW:         1.20 cm   LV E/e' medial:  10.0 LV IVS:        1.10 cm   LV e' lateral:   5.87 cm/s LVOT diam:     2.20 cm   LV E/e' lateral: 12.9 LV SV:         116 LV SV Index:   44 LVOT Area:     3.80 cm  LEFT ATRIUM             Index        RIGHT ATRIUM           Index LA diam:        3.50 cm 1.33 cm/m   RA Area:     14.30 cm LA Vol (A2C):   39.0 ml 14.85 ml/m  RA Volume:   35.70 ml  13.59 ml/m LA Vol (A4C):   46.7 ml 17.78 ml/m LA Biplane Vol: 45.1 ml 17.17 ml/m  AORTIC VALVE AV Area (Vmax):    2.75 cm AV Area (Vmean):   3.11 cm AV Area (VTI):     3.27 cm AV Vmax:           166.00 cm/s AV Vmean:          110.000 cm/s AV VTI:            0.356 m AV Peak Grad:      11.0 mmHg AV Mean Grad:      6.0 mmHg LVOT Vmax:         120.00 cm/s LVOT Vmean:        90.100 cm/s LVOT VTI:          0.306 m LVOT/AV VTI ratio: 0.86  AORTA Ao Root diam: 3.20 cm Ao Asc diam:  2.70 cm MITRAL VALVE               TRICUSPID VALVE MV Area (PHT): 4.15 cm    TR Peak grad:   16.8 mmHg MV Decel Time: 183 msec    TR Vmax:        205.00 cm/s MV E velocity: 76.00  cm/s MV A velocity: 80.20 cm/s  SHUNTS MV E/A ratio:  0.95        Systemic VTI:  0.31 m                            Systemic Diam: 2.20 cm Redell Shallow MD Electronically signed by Redell Shallow MD Signature Date/Time: 07/04/2024/11:26:27 AM    Final      Assessment & Plan:   No problem-specific Assessment & Plan notes found for this encounter.     Brittany LELON Ferrari,  NP 07/12/2024

## 2024-07-12 NOTE — Patient Instructions (Addendum)
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 Gabapentin  at 300 mg taken at bedtime has been effective in managing your cough. You should continue taking gabapentin  as prescribed and report any changes in your symptoms via the patient portal.  -CHRONIC PULMONARY GROUND GLASS OPACITIES: Chronic ground glass opacities in your lungs were first noted in 2021 and remain mild. These opacities are areas in the lungs that appear hazy on imaging tests. We will perform serology tests to check for any underlying autoimmune conditions and continue to monitor these opacities.  -ASTHMA: Asthma is a condition where your airways narrow and swell, making it difficult to breathe. Continue with your current asthma management plan and report any changes in your symptoms.  -MILD DIASTOLIC DYSFUNCTION: Mild diastolic dysfunction means that your heart's left ventricle has a slight difficulty relaxing, which can be related to high blood pressure. Your heart's pumping ability is normal. Valves were normal. It is important to keep your blood pressure an cholesterol well-controlled to manage this condition.  INSTRUCTIONS: Please continue taking gabapentin  300 mg at bedtime for your cough. We will perform serology tests to check for any underlying autoimmune conditions related to your lung opacities. Ensure your blood pressure is well-controlled, and report any changes in your symptoms via the patient portal.  Follow-up 6 months with Dr. Geronimo with 30 min spirometry prior Labs today

## 2024-07-12 NOTE — Progress Notes (Addendum)
 @Patient  ID: Brittany Riggs, female    DOB: [birth year], 53 y.o.   MRN: [id number]  Chief Complaint  Patient presents with   Follow-up    Echo results     Referring provider: Teresa Channel, MD  HPI: 53 year old female, never smoked. PMH significant for HTN, severe persistent asthma, GERD, LPR, cough variant asthma, Hashimoto's disease, prediabetes, severe obesity.   07/12/2024 Discussed the use of AI scribe software for clinical note transcription with the patient, who gave verbal consent to proceed.  History of Present Illness   OLUWANIFEMI Riggs is a 53 year old female with post-COVID long haul cough and asthma who presents for follow-up of her respiratory symptoms.  Her cough has improved since March, which she attributes to gabapentin , taken at 300 mg at bedtime. The cough was initially thought to be related to an irritable larynx and was exacerbated by her asthma. She attempted to discontinue carvedilol  due to its potential to cause cough but resumed it for blood pressure management without any change in her cough.  She experiences persistent shortness of breath, though it has improved. She has started exercising and monitoring her diet to further alleviate her symptoms. An echocardiogram showed a normal ejection fraction.  She has a history of rheumatoid arthritis, managed with Plaquenil  and Rinvoq, and is under the care of a rheumatologist. She undergoes blood work every three months.  A CT scan in 2021 showed chronic ground glass opacities in the lungs, unchanged as of 2025. She has not pursued vocal rehab or an ENT consultation, as she feels gabapentin  has sufficiently managed her symptoms.  No sleep issues and a normal sleep apnea test are reported.     Allergies  Allergen Reactions   Amoxicillin Hives   Codeine Itching   Levaquin [Levofloxacin]     Severe diarrhea   Penicillins Hives    Has patient had a PCN reaction causing immediate rash, facial/tongue/throat  swelling, SOB or lightheadedness with hypotension: Yes Has patient had a PCN reaction causing severe rash involving mucus membranes or skin necrosis: no Has patient had a PCN reaction that required hospitalization: no Has patient had a PCN reaction occurring within the last 10 years: no If all of the above answers are NO, then may proceed with Cephalosporin use.    Prednisone  Other (See Comments)    Severe cramps and spasms   Sulfa Antibiotics Other (See Comments)    Does not remember side effects   Latex Rash and Other (See Comments)    Sometimes bothers me    Immunization History  Administered Date(s) Administered   Influenza Split 09/14/2015   Influenza-Unspecified 09/14/2019   PFIZER(Purple Top)SARS-COV-2 Vaccination 02/19/2020    Past Medical History:  Diagnosis Date   Allergy     Anemia    Asthma    GERD (gastroesophageal reflux disease)    Hypertension    Hypothyroidism    IBS (irritable bowel syndrome)    Migraine    Muscle cramp    Obesity    Plantar fasciitis    Rheumatoid arthritis (HCC)    Vitamin D  deficiency     Tobacco History: Social History   Tobacco Use  Smoking Status Never   Passive exposure: Never  Smokeless Tobacco Never   Counseling given: Not Answered   Outpatient Medications Prior to Visit  Medication Sig Dispense Refill   albuterol  (PROVENTIL ) (2.5 MG/3ML) 0.083% nebulizer solution Take 3 mLs (2.5 mg total) by nebulization every 4 (four) hours as needed for  wheezing or shortness of breath (coughing fits). 150 mL 1   albuterol  (VENTOLIN  HFA) 108 (90 Base) MCG/ACT inhaler Inhale 2 puffs into the lungs every 4 (four) hours as needed for wheezing or shortness of breath (coughing fits). 18 g 1   Ascorbic Acid  (VITAMIN C) 1000 MG tablet 1 tablet     benzonatate  (TESSALON  PERLES) 100 MG capsule Take 2 capsules (200 mg total) by mouth 3 (three) times daily as needed for cough. 30 capsule 0   budeson-glycopyrrolate-formoterol  (BREZTRI   AEROSPHERE) 160-9-4.8 MCG/ACT AERO inhaler Inhale 2 puffs into the lungs in the morning and at bedtime. Rinse mouth out afterwards. 11 g 3   budesonide  (PULMICORT ) 0.5 MG/2ML nebulizer solution Take 2 mLs (0.5 mg total) by nebulization in the morning and at bedtime. 120 mL 1   carvedilol  (COREG ) 3.125 MG tablet Take 3.125 mg by mouth 2 (two) times daily with a meal.      chlorthalidone  (HYGROTON ) 25 MG tablet 1 tablet in the morning with food     Cholecalciferol (VITAMIN D3) 5000 units TABS Take 5,000 Units by mouth daily.      dextromethorphan  15 MG/5ML syrup Take 10 mLs (30 mg total) by mouth 4 (four) times daily as needed for cough. 120 mL 1   EPINEPHrine  0.3 mg/0.3 mL IJ SOAJ injection Inject 0.3 mg into the muscle as needed for anaphylaxis. 2 each 1   Eszopiclone 3 MG TABS Take 3 mg by mouth at bedtime as needed (sleep). Take immediately before bedtime     gabapentin  (NEURONTIN ) 300 MG capsule Take 1 capsule (300 mg total) by mouth at bedtime. 30 capsule 5   hydroxychloroquine  (PLAQUENIL ) 200 MG tablet Take 200 mg by mouth daily. 2 tabs daily     irbesartan (AVAPRO) 300 MG tablet 1 tablet     JOLESSA 0.15-0.03 MG tablet Take 1 tablet by mouth daily.     Lifitegrast 5 % SOLN Place 1 drop into both eyes 2 (two) times daily.     loperamide  (IMODIUM ) 2 MG capsule Take 1 capsule (2 mg total) by mouth 4 (four) times daily as needed for diarrhea or loose stools. 20 capsule 0   metaxalone  (SKELAXIN ) 800 MG tablet 1 tablet     metroNIDAZOLE (METROCREAM) 0.75 % cream Apply 1 application topically See admin instructions.      montelukast  (SINGULAIR ) 10 MG tablet Take 1 tablet (10 mg total) by mouth at bedtime. 90 tablet 1   naratriptan (AMERGE) 2.5 MG tablet Take 2.5 mg by mouth as needed for migraine.   5   NURTEC 75 MG TBDP Take 1 tablet by mouth daily as needed.     Olopatadine -Mometasone  (RYALTRIS ) 665-25 MCG/ACT SUSP Place 1-2 sprays into the nose in the morning and at bedtime. 29 g 5    Olopatadine -Mometasone  (RYALTRIS ) 665-25 MCG/ACT SUSP 1-2 sprays per nostril once a day at night.     ondansetron  (ZOFRAN  ODT) 4 MG disintegrating tablet Take 1 tablet (4 mg total) by mouth every 8 (eight) hours as needed for nausea or vomiting. 21 tablet 0   pantoprazole  (PROTONIX ) 40 MG tablet Take 40 mg by mouth daily.     Respiratory Therapy Supplies (FLUTTER) DEVI Use as directed 1 each 0   Rimegepant Sulfate (NURTEC) 75 MG TBDP      Tezepelumab -ekko (TEZSPIRE ) 210 MG/1. SOAJ Inject 210 mg into the skin every 28 (twenty-eight) days. 1.91 mL 11   tiZANidine  (ZANAFLEX ) 2 MG tablet Take 1 tablet (2 mg total) by mouth at  bedtime as needed for muscle spasms. 30 tablet 0   traMADol  (ULTRAM ) 50 MG tablet 1-2 every 4 hours as needed for cough or pain (Patient taking differently: Take 50-100 mg by mouth every 4 (four) hours as needed (pain). 1-2 every 4 hours as needed for cough or pain) 40 tablet 0   valACYclovir (VALTREX) 500 MG tablet Take 500 mg by mouth daily.     venlafaxine  XR (EFFEXOR -XR) 150 MG 24 hr capsule Take 150 mg by mouth 2 (two) times daily with a meal.      Vitamin D , Ergocalciferol , 50000 units CAPS 1 capsule     No facility-administered medications prior to visit.   Review of Systems  Review of Systems  Constitutional: Negative.   Respiratory:  Positive for cough. Negative for shortness of breath and wheezing.    Physical Exam  BP (!) 132/97 (BP Location: Left Arm, Patient Position: Sitting, Cuff Size: Large)   Pulse (!) 104   Temp (!) 97.1 F (36.2 C) (Temporal)   Ht 5' 9 (1.753 m)   Wt (!) 372 lb 9.6 oz (169 kg)   SpO2 96%   BMI 55.02 kg/m  Physical Exam Constitutional:      Appearance: Normal appearance.  Cardiovascular:     Rate and Rhythm: Normal rate and regular rhythm.  Pulmonary:     Effort: Pulmonary effort is normal.     Breath sounds: Normal breath sounds.     Comments: CTA Musculoskeletal:        General: Normal range of motion.  Skin:     General: Skin is warm and dry.  Neurological:     General: No focal deficit present.     Mental Status: She is alert and oriented to person, place, and time. Mental status is at baseline.  Psychiatric:        Mood and Affect: Mood normal.        Behavior: Behavior normal.        Thought Content: Thought content normal.        Judgment: Judgment normal.      Lab Results:  CBC    Component Value Date/Time   WBC 5.6 12/29/2023 1212   WBC 23.2 (H) 09/06/2020 0430   RBC 3.84 12/29/2023 1212   RBC 3.09 (L) 09/06/2020 0430   HGB 12.7 12/29/2023 1212   HCT 39.2 12/29/2023 1212   PLT 365 12/29/2023 1212   MCV 102 (H) 12/29/2023 1212   MCH 33.1 (H) 12/29/2023 1212   MCH 32.4 09/06/2020 0430   MCHC 32.4 12/29/2023 1212   MCHC 33.0 09/06/2020 0430   RDW 12.8 12/29/2023 1212   LYMPHSABS 1.4 12/29/2023 1212   MONOABS 2.0 (H) 09/06/2020 0430   EOSABS 0.0 12/29/2023 1212   BASOSABS 0.0 12/29/2023 1212    BMET    Component Value Date/Time   NA 138 12/29/2023 1212   K 4.1 12/29/2023 1212   CL 100 12/29/2023 1212   CO2 23 12/29/2023 1212   GLUCOSE 84 12/29/2023 1212   GLUCOSE 113 (H) 09/06/2020 0430   BUN 20 12/29/2023 1212   CREATININE 1.59 (H) 12/29/2023 1212   CALCIUM 9.3 12/29/2023 1212   GFRNONAA 44 (L) 09/06/2020 0430   GFRAA 51 (L) 09/06/2020 0430    BNP No results found for: BNP  ProBNP No results found for: PROBNP  Imaging: ECHOCARDIOGRAM COMPLETE Result Date: 07/04/2024    ECHOCARDIOGRAM REPORT   Patient Name:   BRENLEE KOSKELA Vohs Date of Exam: 07/04/2024 Medical Rec #:  [id number]         Height:       68.0 in Accession #:    [id number]        Weight:       361.0 lb Date of Birth:  05-Jun-1971         BSA:          2.627 m Patient Age:    52 years          BP:           148/112 mmHg Patient Gender: F                 HR:           84 bpm. Exam Location:  [address] Procedure: 2D Echo, Cardiac Doppler, Color Doppler and Intracardiac            Opacification Agent  (Both Spectral and Color Flow Doppler were            utilized during procedure). Indications:    Chronic Cough R05.3                 DOE R06.09  History:        Patient has no prior history of Echocardiogram examinations.                 Signs/Symptoms:Shortness of Breath and Cough; Risk                 Factors:Hypertension.  Sonographer:    Cherene Ravens RDCS Referring Phys: 6 MURALI RAMASWAMY IMPRESSIONS  1. Left ventricular ejection fraction, by estimation, is 60 to 65%. The left ventricle has normal function. The left ventricle has no regional wall motion abnormalities. There is mild left ventricular hypertrophy. Left ventricular diastolic parameters are consistent with Grade I diastolic dysfunction (impaired relaxation).  2. Right ventricular systolic function is normal. The right ventricular size is normal.  3. The mitral valve is normal in structure. Trivial mitral valve regurgitation. No evidence of mitral stenosis.  4. The aortic valve is tricuspid. Aortic valve regurgitation is not visualized. No aortic stenosis is present. Comparison(s): No prior Echocardiogram. FINDINGS  Left Ventricle: Left ventricular ejection fraction, by estimation, is 60 to 65%. The left ventricle has normal function. The left ventricle has no regional wall motion abnormalities. Definity  contrast agent was given IV to delineate the left ventricular  endocardial borders. The left ventricular internal cavity size was normal in size. There is mild left ventricular hypertrophy. Left ventricular diastolic parameters are consistent with Grade I diastolic dysfunction (impaired relaxation). Right Ventricle: The right ventricular size is normal. Right ventricular systolic function is normal. Left Atrium: Left atrial size was normal in size. Right Atrium: Right atrial size was normal in size. Pericardium: Trivial pericardial effusion is present. Mitral Valve: The mitral valve is normal in structure. Trivial mitral valve regurgitation. No  evidence of mitral valve stenosis. Tricuspid Valve: The tricuspid valve is normal in structure. Tricuspid valve regurgitation is trivial. No evidence of tricuspid stenosis. Aortic Valve: The aortic valve is tricuspid. Aortic valve regurgitation is not visualized. No aortic stenosis is present. Aortic valve mean gradient measures 6.0 mmHg. Aortic valve peak gradient measures 11.0 mmHg. Aortic valve area, by VTI measures 3.27  cm. Pulmonic Valve: The pulmonic valve was normal in structure. Pulmonic valve regurgitation is not visualized. No evidence of pulmonic stenosis. Aorta: The aortic root is normal in size and structure. Venous: The inferior vena cava was not well visualized. IAS/Shunts:  The interatrial septum was not well visualized.  LEFT VENTRICLE PLAX 2D LVIDd:         4.30 cm   Diastology LVIDs:         3.20 cm   LV e' medial:    7.62 cm/s LV PW:         1.20 cm   LV E/e' medial:  10.0 LV IVS:        1.10 cm   LV e' lateral:   5.87 cm/s LVOT diam:     2.20 cm   LV E/e' lateral: 12.9 LV SV:         116 LV SV Index:   44 LVOT Area:     3.80 cm  LEFT ATRIUM             Index        RIGHT ATRIUM           Index LA diam:        3.50 cm 1.33 cm/m   RA Area:     14.30 cm LA Vol (A2C):   39.0 ml 14.85 ml/m  RA Volume:   35.70 ml  13.59 ml/m LA Vol (A4C):   46.7 ml 17.78 ml/m LA Biplane Vol: 45.1 ml 17.17 ml/m  AORTIC VALVE AV Area (Vmax):    2.75 cm AV Area (Vmean):   3.11 cm AV Area (VTI):     3.27 cm AV Vmax:           166.00 cm/s AV Vmean:          110.000 cm/s AV VTI:            0.356 m AV Peak Grad:      11.0 mmHg AV Mean Grad:      6.0 mmHg LVOT Vmax:         120.00 cm/s LVOT Vmean:        90.100 cm/s LVOT VTI:          0.306 m LVOT/AV VTI ratio: 0.86  AORTA Ao Root diam: 3.20 cm Ao Asc diam:  2.70 cm MITRAL VALVE               TRICUSPID VALVE MV Area (PHT): 4.15 cm    TR Peak grad:   16.8 mmHg MV Decel Time: 183 msec    TR Vmax:        205.00 cm/s MV E velocity: 76.00 cm/s MV A velocity: 80.20 cm/s   SHUNTS MV E/A ratio:  0.95        Systemic VTI:  0.31 m                            Systemic Diam: 2.20 cm Redell Shallow MD Electronically signed by Redell Shallow MD Signature Date/Time: 07/04/2024/11:26:27 AM    Final      Assessment & Plan:   1. Ground glass opacity present on imaging of lung (Primary) - Antinuclear Antib (ANA); Future - Rheumatoid Factor; Future - Cyclic citrul peptide antibody, IgG; Future - Sed Rate (ESR); Future - QuantiFERON-TB Gold Plus; Future - Pulmonary Function Test; Future - Antinuclear Antib (ANA) - Rheumatoid Factor - Cyclic citrul peptide antibody, IgG - Sed Rate (ESR) - QuantiFERON-TB Gold Plus  2. 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 Gabapentin  300  mg at bedtime has been effective in managing the cough. No change in cough quality when off carvedilol , which was initially considered as a potential cause. No need for ENT referral or vocal rehab as symptoms are well-controlled with gabapentin . 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 Potential underlying autoimmune conditions were considered, but she is already under rheumatologic care for rheumatoid arthritis. - Quantiferon-Gold, CCP and ANA were negtive  - Continue observation of pulmonary opacities  Rheumatoid arthritis - RF positive and sed rate elevated (but improved from baseline)  Following with Rheumatology, maintained on Plaquenil  200mg  daily and Rinvoq  Asthma Currently well controlled with Breztri , Singulair  and Tezspire    Mild diastolic dysfunction Echocardiogram shows mild diastolic dysfunction with impaired relaxation of the left  ventricle, likely due to hypertension. Ejection fraction is normal at 60-65%. No significant valvular abnormalities noted. Blood pressure control is crucial. - Ensure blood pressure is well-controlled   Almarie LELON Ferrari, NP 07/12/2024

## 2024-07-13 ENCOUNTER — Ambulatory Visit: Payer: Self-pay | Admitting: Primary Care

## 2024-07-14 LAB — ANA: Anti Nuclear Antibody (ANA): NEGATIVE

## 2024-07-14 LAB — CYCLIC CITRUL PEPTIDE ANTIBODY, IGG: Cyclic Citrullin Peptide Ab: <16 U

## 2024-07-14 LAB — RHEUMATOID FACTOR: Rheumatoid fact SerPl-aCnc: 54 [IU]/mL — ABNORMAL HIGH (ref <14)

## 2024-07-16 LAB — QUANTIFERON-TB GOLD PLUS
Mitogen-NIL: 2.89 [IU]/mL
NIL: 0.02 [IU]/mL
QuantiFERON-TB Gold Plus: NEGATIVE
TB1-NIL: 0 [IU]/mL
TB2-NIL: 0.01 [IU]/mL

## 2024-07-18 NOTE — Progress Notes (Unsigned)
 Follow Up Note  RE: Brittany Riggs MRN: [id number] DOB: [birth year] Date of Office Visit: 07/19/2024  Referring provider: Teresa Channel, MD Primary care provider: Teresa Channel, MD  Chief Complaint: Recurrent Skin Infections, Cough, Asthma, and Follow-up (She is doing better from last visit. Slight coughing)  History of Present Illness: I had the pleasure of seeing Brittany Riggs for a follow up visit at the [facility] on 07/19/2024. She is a 53 y.o. female, who is being followed for chronic coughing, recurrent infections, asthma on Tezspire , food allergy , allergic rhinitis on AIT, GERD. Her previous allergy  office visit was on 03/27/2024 with Dr. Luke. Today is a regular follow up visit.  Discussed the use of AI scribe software for clinical note transcription with the patient, who gave verbal consent to proceed.    Her chronic cough has improved by approximately 60-70% since the last visit, though it persists with drainage but is less severe. She has not required emergency care or additional courses of prednisone  and antibiotics since the last visit. She continues to take gabapentin  300 mg, which she finds helpful. She recently visited her pulmonologist.  She receives Tezspire  injections every four weeks. She is also on Breztri , Singulair , and occasionally uses a nebulizer or rescue inhaler, with the last use being this past Sunday with budesonide . She cannot use nasal sprays due to epistaxis and takes Claritin  in the morning and Zyrtec at night for allergies. She avoids shellfish and sesame and carries an EpiPen .  She received a pneumonia shot at her last visit but has not had blood work to confirm its efficacy. She experiences significant drainage and has had a headache since Sunday, located over her left eye temple. She has a history of migraines but has not had one recently. She takes Tylenol  for headaches as she cannot take NSAIDs or aspirin. No fevers, chills, or  wheezing.   She works in close proximity to others but wears a mask to prevent illness. She has tried various allergy  medications, finding Claritin  the most tolerable during the day.     Ryaltris  caused epistaxis.   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 Gabapentin  300 mg at bedtime has been effective in managing the cough. No change in cough quality when off carvedilol , which was initially considered as a potential cause. No need for ENT referral or vocal rehab as symptoms are well-controlled with gabapentin . 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 Potential underlying autoimmune conditions were considered, but she is already under rheumatologic care for rheumatoid arthritis. - Quantiferon-Gold, CCP and ANA were negtive  - Continue observation of pulmonary opacities   Rheumatoid arthritis - RF positive and sed rate elevated (but improved from baseline)  Following with Rheumatology, maintained on Plaquenil  200mg  daily and Rinvoq   Asthma Currently well controlled with Breztri , Singulair  and Tezspire     Mild diastolic dysfunction Echocardiogram shows mild diastolic dysfunction with impaired relaxation of the left ventricle, likely due to hypertension. Ejection fraction is normal at 60-65%. No significant valvular abnormalities noted. Blood pressure control is crucial. - Ensure blood pressure is well-controlled  Assessment and Plan: [name] is a 53 y.o. female with: Chronic coughing Past history - persistent cough despite  treatment with Tezspire , Breztri , Singulair , and nebulized albuterol  and budesonide . Cough flared with recent upper respiratory infection treated  with Z-Pak and Tessalon  Pearls. Yellow sputum production. Prior CT chest in 2021 showed ground glass opacities and bronchiectasis.  Interim history - 60-70% improved. Saw pulmonology and echo done. Declined ENT referral.  Continue with gabapentin . Follow up with pulmonology as scheduled.  Consider seeing ENT if symptoms not improving.   Severe persistent asthma Tezspire  injections may be helping. Follows with pulmonology as well. Today's spirometry was normal but it did make her cough a lot post testing. Will only get spirometry once a year if needed.  Daily controller medication(s):  Continue Breztri  2 puffs twice a day with spacer and rinse mouth afterwards. Continue Singulair  (montelukast ) 10mg  daily at night. Tezspire  injections every 4 weeks. During respiratory infections/asthma flares:  Start Pulmicort  0.5mg  nebulizer twice a day for 1-2 weeks until your breathing symptoms return to baseline.  Pretreat with albuterol  nebulizer.  If you need to use your albuterol  nebulizer machine back to back within 15-30 minutes with no relief then please go to the ER/urgent care for further evaluation.    Anaphylactic reaction due to food, subsequent encounter Past history - Sesame causes itching. 2021 bloodwork borderline positive to shellfish.  Continue to avoid shellfish and sesame. For mild symptoms you can take over the counter antihistamines such as Benadryl and monitor symptoms closely. If symptoms worsen or if you have severe symptoms including breathing issues, throat closure, significant swelling, whole body hives, severe diarrhea and vomiting, lightheadedness then inject epinephrine  and seek immediate medical care afterwards.   Seasonal allergic rhinitis due to pollen Allergic rhinitis due to animal dander Allergic rhinitis due to dust mite Allergic rhinitis due to mold Allergy  to cockroaches Past history - started AIT on 07/02/2016 (G/Weeds/T + M/DM/C/CR). Nasal sprays cause epistaxis.  Patient has dry eyes. Ryaltris  caused epistaxis. Unable to get carbinoxamine .  Interim history - still has PND.  Continue allergy  injections - given today.  Check environmental panel to see if we can come off injections.  Continue environmental control measures.  Continue Singulair  (montelukast ) 10mg  daily at night. May take periactin  4mg  twice a day as needed. This would replace your other antihistamines. If it makes you too tired in the morning, then take the Claritin  in the morning and periactin  at night. Nasal saline spray (i.e., Simply Saline) or nasal saline lavage (i.e., NeilMed) is recommended as needed and prior to medicated nasal sprays.   Gastroesophageal reflux disease, unspecified whether esophagitis present Continue lifestyle and dietary modifications. Take pantoprazole  40mg  twice a day. Nothing to eat or drink for 30 min afterwards.   Recurrent infections Past history - 2025 labs normal immunoglobulin levels, poor pneumococcal titers. On  rinvoq and plaquenil  for RA by rheumatology. Keep track of infections and antibiotics use. Get bloodwork to check response to the pneumonia shot.  Recommend annual flu shot.  Headache Intermittent headaches over left eye temple, persistent since Sunday. Unable to take NSAIDs or aspirin. Periactin  may help as adjunct treatment. Follow up with PCP.  Periactin  may also help with the headaches.   Return in about 6 months (around 01/19/2025).  Meds ordered this encounter  Medications   EPINEPHrine  0.3 mg/0.3 mL IJ SOAJ injection    Sig: Inject 0.3 mg into the muscle as needed for anaphylaxis.    Dispense:  2 each    Refill:  1    May dispense generic/Mylan/Teva brand.   cyproheptadine  (PERIACTIN ) 4 MG tablet  Sig: Take 1 tablet (4 mg total) by mouth 2 (two) times daily as needed for allergies.    Dispense:  60 tablet    Refill:  3   Lab Orders         Strep pneumoniae 23 Serotypes IgG         Allergens w/Total IgE Area 2       Diagnostics: Spirometry:  Tracings reviewed. Her effort: Good reproducible efforts. FVC: 2.63L FEV1: 2.07L, 81% predicted FEV1/FVC ratio: 79% Interpretation: Spirometry consistent with normal pattern.  Please see scanned spirometry results for details.  Results discussed with patient/family.   Medication List:  Current Outpatient Medications  Medication Sig Dispense Refill   albuterol  (PROVENTIL ) (2.5 MG/3ML) 0.083% nebulizer solution Take 3 mLs (2.5 mg total) by nebulization every 4 (four) hours as needed for wheezing or shortness of breath (coughing fits). 150 mL 1   albuterol  (VENTOLIN  HFA) 108 (90 Base) MCG/ACT inhaler Inhale 2 puffs into the lungs every 4 (four) hours as needed for wheezing or shortness of breath (coughing fits). 18 g 1   Ascorbic Acid  (VITAMIN C) 1000 MG tablet 1 tablet     benzonatate  (TESSALON  PERLES) 100 MG capsule Take 2 capsules (200 mg total) by mouth 3 (three) times daily as needed for cough. 30 capsule 0   budeson-glycopyrrolate-formoterol  (BREZTRI  AEROSPHERE) 160-9-4.8 MCG/ACT AERO inhaler Inhale 2 puffs into the lungs in the morning and at bedtime. Rinse mouth out afterwards. 11 g 3   budesonide  (PULMICORT ) 0.5 MG/2ML nebulizer solution Take 2 mLs (0.5 mg total) by nebulization in the morning and at bedtime. 120 mL 1   carvedilol  (COREG ) 3.125 MG tablet Take 3.125 mg by mouth 2 (two) times daily with a meal.      chlorthalidone  (HYGROTON ) 25 MG tablet 1 tablet in the morning with food     Cholecalciferol (VITAMIN D3) 5000 units TABS Take 5,000 Units by mouth daily.      cyproheptadine  (PERIACTIN ) 4 MG tablet Take 1 tablet (4 mg total) by mouth 2 (two) times daily as needed for allergies. 60 tablet 3   dextromethorphan  15 MG/5ML syrup Take 10 mLs (30 mg total) by mouth 4 (four) times daily as needed for cough. 120 mL 1   EPINEPHrine  0.3 mg/0.3 mL IJ SOAJ injection Inject 0.3 mg into the muscle as needed for anaphylaxis. 2 each 1   Eszopiclone 3 MG TABS  Take 3 mg by mouth at bedtime as needed (sleep). Take immediately before bedtime     gabapentin  (NEURONTIN ) 300 MG capsule Take 1 capsule (300 mg total) by mouth at bedtime. 30 capsule 5   hydroxychloroquine  (PLAQUENIL ) 200 MG tablet Take 200 mg by mouth daily. 2 tabs daily     irbesartan (AVAPRO) 300 MG tablet 1 tablet     JOLESSA 0.15-0.03 MG tablet Take 1 tablet by mouth daily.     Lifitegrast 5 % SOLN Place 1 drop into both eyes 2 (two) times daily.     loperamide  (IMODIUM ) 2 MG capsule Take 1 capsule (2 mg total) by mouth 4 (four) times daily as needed for diarrhea or loose stools. 20 capsule 0   metaxalone  (SKELAXIN ) 800 MG tablet 1 tablet     metroNIDAZOLE (METROCREAM) 0.75 % cream Apply 1 application topically See admin instructions.      montelukast  (SINGULAIR ) 10 MG tablet Take 1 tablet (10 mg total) by mouth at bedtime. 90 tablet 1   naratriptan (AMERGE) 2.5 MG tablet Take 2.5 mg by mouth as needed  for migraine.   5   NURTEC 75 MG TBDP Take 1 tablet by mouth daily as needed.     Olopatadine -Mometasone  (RYALTRIS ) 665-25 MCG/ACT SUSP Place 1-2 sprays into the nose in the morning and at bedtime. 29 g 5   ondansetron  (ZOFRAN  ODT) 4 MG disintegrating tablet Take 1 tablet (4 mg total) by mouth every 8 (eight) hours as needed for nausea or vomiting. 21 tablet 0   pantoprazole  (PROTONIX ) 40 MG tablet Take 40 mg by mouth daily.     predniSONE  (DELTASONE ) 10 MG tablet Take 10 mg by mouth 2 (two) times daily.     Respiratory Therapy Supplies (FLUTTER) DEVI Use as directed 1 each 0   Rimegepant Sulfate (NURTEC) 75 MG TBDP      RINVOQ 15 MG TB24 Take 1 tablet by mouth daily.     Tezepelumab -ekko (TEZSPIRE ) 210 MG/1. SOAJ Inject 210 mg into the skin every 28 (twenty-eight) days. 1.91 mL 11   tiZANidine  (ZANAFLEX ) 2 MG tablet Take 1 tablet (2 mg total) by mouth at bedtime as needed for muscle spasms. 30 tablet 0   traMADol  (ULTRAM ) 50 MG tablet 1-2 every 4 hours as needed for cough or pain 40  tablet 0   valACYclovir (VALTREX) 500 MG tablet Take 500 mg by mouth daily.     venlafaxine  XR (EFFEXOR -XR) 150 MG 24 hr capsule Take 150 mg by mouth 2 (two) times daily with a meal.      Vitamin D , Ergocalciferol , 50000 units CAPS 1 capsule     No current facility-administered medications for this visit.   Allergies: Allergies  Allergen Reactions   Amoxicillin Hives   Codeine Itching   Levaquin [Levofloxacin]     Severe diarrhea   Penicillins Hives    Has patient had a PCN reaction causing immediate rash, facial/tongue/throat swelling, SOB or lightheadedness with hypotension: Yes Has patient had a PCN reaction causing severe rash involving mucus membranes or skin necrosis: no Has patient had a PCN reaction that required hospitalization: no Has patient had a PCN reaction occurring within the last 10 years: no If all of the above answers are NO, then may proceed with Cephalosporin use.    Prednisone  Other (See Comments)    Severe cramps and spasms   Sulfa Antibiotics Other (See Comments)    Does not remember side effects   Latex Rash and Other (See Comments)    Sometimes bothers me   I reviewed her past medical history, social history, family history, and environmental history and no significant changes have been reported from her previous visit.  Review of Systems  Constitutional:  Negative for appetite change, chills, fever and unexpected weight change.  HENT:  Positive for postnasal drip. Negative for congestion, rhinorrhea and sneezing.   Eyes:  Negative for itching.  Respiratory:  Positive for cough, chest tightness and shortness of breath. Negative for wheezing.   Gastrointestinal:  Negative for abdominal pain.  Skin:  Negative for rash.  Allergic/Immunologic: Positive for environmental allergies and food allergies.  Neurological:  Positive for headaches.    Objective: BP 138/84 (BP Location: Left Arm, Patient Position: Sitting, Cuff Size: Large)   Pulse (!) 114    Temp 97.8 F (36.6 C) (Temporal)   Wt (!) 366 lb 6.4 oz (166.2 kg)   SpO2 96%   BMI 54.11 kg/m  Body mass index is 54.11 kg/m. Physical Exam Vitals and nursing note reviewed.  Constitutional:      Appearance: Normal appearance. She is well-developed. She  is obese.  HENT:     Head: Normocephalic and atraumatic.     Right Ear: Tympanic membrane and external ear normal.     Left Ear: Tympanic membrane and external ear normal.     Nose: Nose normal.     Mouth/Throat:     Mouth: Mucous membranes are moist.     Pharynx: Oropharynx is clear.  Eyes:     Conjunctiva/sclera: Conjunctivae normal.  Cardiovascular:     Rate and Rhythm: Normal rate and regular rhythm.     Heart sounds: Normal heart sounds. No murmur heard. Pulmonary:     Effort: Pulmonary effort is normal.     Breath sounds: Normal breath sounds. No wheezing, rhonchi or rales.     Comments: Dry coughing during visit. Musculoskeletal:     Cervical back: Neck supple.  Skin:    General: Skin is warm.     Findings: No rash.  Neurological:     Mental Status: She is alert and oriented to person, place, and time.  Psychiatric:        Behavior: Behavior normal.    Previous notes and tests were reviewed. The plan was reviewed with the patient/family, and all questions/concerned were addressed.  It was my pleasure to see Stephnie today and participate in her care. Please feel free to contact me with any questions or concerns.  Sincerely,  Orlan Cramp, DO Allergy  & Immunology  [facility]  [place] office: [phone] [facility] office: [phone]

## 2024-07-19 ENCOUNTER — Other Ambulatory Visit: Payer: Self-pay

## 2024-07-19 ENCOUNTER — Ambulatory Visit (INDEPENDENT_AMBULATORY_CARE_PROVIDER_SITE_OTHER): Admitting: Allergy

## 2024-07-19 ENCOUNTER — Encounter: Payer: Self-pay | Admitting: Allergy

## 2024-07-19 VITALS — BP 138/84 | HR 114 | Temp 97.8°F | Wt 366.4 lb

## 2024-07-19 DIAGNOSIS — J4550 Severe persistent asthma, uncomplicated: Secondary | ICD-10-CM

## 2024-07-19 DIAGNOSIS — B999 Unspecified infectious disease: Secondary | ICD-10-CM

## 2024-07-19 DIAGNOSIS — R053 Chronic cough: Secondary | ICD-10-CM

## 2024-07-19 DIAGNOSIS — T7800XD Anaphylactic reaction due to unspecified food, subsequent encounter: Secondary | ICD-10-CM

## 2024-07-19 DIAGNOSIS — J301 Allergic rhinitis due to pollen: Secondary | ICD-10-CM

## 2024-07-19 DIAGNOSIS — K219 Gastro-esophageal reflux disease without esophagitis: Secondary | ICD-10-CM

## 2024-07-19 DIAGNOSIS — Z91038 Other insect allergy status: Secondary | ICD-10-CM

## 2024-07-19 DIAGNOSIS — J3089 Other allergic rhinitis: Secondary | ICD-10-CM

## 2024-07-19 DIAGNOSIS — J3081 Allergic rhinitis due to animal (cat) (dog) hair and dander: Secondary | ICD-10-CM | POA: Diagnosis not present

## 2024-07-19 DIAGNOSIS — R519 Headache, unspecified: Secondary | ICD-10-CM

## 2024-07-19 MED ORDER — EPINEPHRINE 0.3 MG/0.3ML IJ SOAJ
0.3000 mg | INTRAMUSCULAR | 1 refills | Status: AC | PRN
Start: 2024-07-19 — End: ?

## 2024-07-19 MED ORDER — CYPROHEPTADINE HCL 4 MG PO TABS: 4.0000 mg | ORAL_TABLET | Freq: Two times a day (BID) | ORAL | 3 refills | Status: AC | PRN

## 2024-07-19 NOTE — Patient Instructions (Addendum)
 Coughing Continue with gabapentin . Follow up with pulmonology as scheduled.  Consider seeing ENT if symptoms not improving.  Asthma Daily controller medication(s):  Continue Breztri  2 puffs twice a day with spacer and rinse mouth afterwards. Continue Singulair  (montelukast ) 10mg  daily at night. Tezspire  injections every 4 weeks - given today.  During respiratory infections/asthma flares:  Start Pulmicort  0.5mg  nebulizer twice a day for 1-2 weeks until your breathing symptoms return to baseline.  Pretreat with albuterol  nebulizer.  If you need to use your albuterol  nebulizer machine back to back within 15-30 minutes with no relief then please go to the ER/urgent care for further evaluation.  Asthma control goals:  Full participation in all desired activities (may need albuterol  before activity) Albuterol  use two times or less a week on average (not counting use with activity) Cough interfering with sleep two times or less a month Oral steroids no more than once a year No hospitalizations   Seasonal and perennial allergic rhinitis Continue allergy  injections - given today.  Check environmental panel to see if we can come off injections.  Continue environmental control measures.  Continue Singulair  (montelukast ) 10mg  daily at night. May take periactin  4mg  twice a day as needed. This would replace your other antihistamines. If it makes you too tired in the morning, then take the Claritin  in the morning and periactin  at night. Nasal saline spray (i.e., Simply Saline) or nasal saline lavage (i.e., NeilMed) is recommended as needed and prior to medicated nasal sprays.   Food allergy  Continue to avoid shellfish and sesame. For mild symptoms you can take over the counter antihistamines (zyrtec 10mg  to 20mg ) and monitor symptoms closely.  If symptoms worsen or if you have severe symptoms including breathing issues, throat closure, significant swelling, whole body hives, severe diarrhea and  vomiting, lightheadedness then use epinephrine  and seek immediate medical care afterwards. Emergency action plan in place.  Heartburn Continue lifestyle and dietary modifications. Take pantoprazole  40mg  twice a day. Nothing to eat or drink for 30 min afterwards.   Infections Keep track of infections and antibiotics use. Get bloodwork to check response to the pneumonia shot.   Headaches Follow up with PCP.   Follow up in 6 months or sooner if needed.

## 2024-07-28 LAB — STREP PNEUMONIAE 23 SEROTYPES IGG
Pneumo Ab Type 1*: 1.6 ug/mL (ref >1.3)
Pneumo Ab Type 12 (12F)*: 0.3 ug/mL — ABNORMAL LOW (ref >1.3)
Pneumo Ab Type 14*: >30.6 ug/mL (ref >1.3)
Pneumo Ab Type 17 (17F)*: 2.3 ug/mL (ref >1.3)
Pneumo Ab Type 19 (19F)*: 2.8 ug/mL (ref >1.3)
Pneumo Ab Type 2*: 2.7 ug/mL (ref >1.3)
Pneumo Ab Type 20*: 1.3 ug/mL — ABNORMAL LOW (ref >1.3)
Pneumo Ab Type 22 (22F)*: 1.9 ug/mL (ref >1.3)
Pneumo Ab Type 23 (23F)*: 6.9 ug/mL (ref >1.3)
Pneumo Ab Type 26 (6B)*: 2.5 ug/mL (ref >1.3)
Pneumo Ab Type 3*: 0.4 ug/mL — ABNORMAL LOW (ref >1.3)
Pneumo Ab Type 34 (10A)*: 6 ug/mL (ref >1.3)
Pneumo Ab Type 4*: 0.9 ug/mL — ABNORMAL LOW (ref >1.3)
Pneumo Ab Type 43 (11A)*: 0.4 ug/mL — ABNORMAL LOW (ref >1.3)
Pneumo Ab Type 5*: 8.1 ug/mL (ref >1.3)
Pneumo Ab Type 51 (7F)*: >19.6 ug/mL (ref >1.3)
Pneumo Ab Type 54 (15B)*: 1.8 ug/mL (ref >1.3)
Pneumo Ab Type 56 (18C)*: 1.5 ug/mL (ref >1.3)
Pneumo Ab Type 57 (19A)*: 2.2 ug/mL (ref >1.3)
Pneumo Ab Type 68 (9V)*: 0.2 ug/mL — ABNORMAL LOW (ref >1.3)
Pneumo Ab Type 70 (33F)*: >20.2 ug/mL (ref >1.3)
Pneumo Ab Type 8*: 14.7 ug/mL (ref >1.3)
Pneumo Ab Type 9 (9N)*: 3.8 ug/mL (ref >1.3)

## 2024-07-28 LAB — ALLERGENS W/TOTAL IGE AREA 2
Alternaria Alternata IgE: <0.1 kU/L
Aspergillus Fumigatus IgE: <0.1 kU/L
Bermuda Grass IgE: 0.27 kU/L — AB
Cat Dander IgE: <0.1 kU/L
Cedar, Mountain IgE: <0.1 kU/L
Cladosporium Herbarum IgE: <0.1 kU/L
Cockroach, German IgE: 0.72 kU/L — AB
Common Silver Birch IgE: 0.12 kU/L — AB
Cottonwood IgE: 0.14 kU/L — AB
D Farinae IgE: 0.23 kU/L — AB
D Pteronyssinus IgE: 0.23 kU/L — AB
Dog Dander IgE: <0.1 kU/L
Elm, American IgE: 0.19 kU/L — AB
IgE (Immunoglobulin E), Serum: 183 [IU]/mL (ref 6–495)
Johnson Grass IgE: 0.32 kU/L — AB
Maple/Box Elder IgE: 0.25 kU/L — AB
Mouse Urine IgE: <0.1 kU/L
Oak, White IgE: <0.1 kU/L
Pecan, Hickory IgE: 0.3 kU/L — AB
Penicillium Chrysogen IgE: <0.1 kU/L
Pigweed, Rough IgE: <0.1 kU/L
Ragweed, Short IgE: 0.85 kU/L — AB
Sheep Sorrel IgE Qn: <0.1 kU/L
Timothy Grass IgE: 0.36 kU/L — AB
White Mulberry IgE: <0.1 kU/L

## 2024-07-31 ENCOUNTER — Ambulatory Visit: Payer: Self-pay | Admitting: Allergy

## 2024-08-09 ENCOUNTER — Ambulatory Visit (INDEPENDENT_AMBULATORY_CARE_PROVIDER_SITE_OTHER)

## 2024-08-09 DIAGNOSIS — J309 Allergic rhinitis, unspecified: Secondary | ICD-10-CM

## 2024-09-08 ENCOUNTER — Ambulatory Visit

## 2024-09-08 DIAGNOSIS — J309 Allergic rhinitis, unspecified: Secondary | ICD-10-CM

## 2024-09-10 ENCOUNTER — Encounter: Payer: Self-pay | Admitting: Allergy

## 2024-09-11 ENCOUNTER — Other Ambulatory Visit: Payer: Self-pay | Admitting: Allergy

## 2024-09-11 MED ORDER — ALBUTEROL SULFATE HFA 108 (90 BASE) MCG/ACT IN AERS: 2.0000 | INHALATION_SPRAY | RESPIRATORY_TRACT | 1 refills | Status: DC | PRN

## 2024-09-11 MED ORDER — BUDESONIDE 0.5 MG/2ML IN SUSP: RESPIRATORY_TRACT | 1 refills | Status: DC

## 2024-09-15 DIAGNOSIS — J302 Other seasonal allergic rhinitis: Secondary | ICD-10-CM | POA: Diagnosis not present

## 2024-09-15 DIAGNOSIS — J3081 Allergic rhinitis due to animal (cat) (dog) hair and dander: Secondary | ICD-10-CM | POA: Diagnosis not present

## 2024-09-15 DIAGNOSIS — J301 Allergic rhinitis due to pollen: Secondary | ICD-10-CM | POA: Diagnosis not present

## 2024-09-15 DIAGNOSIS — J3089 Other allergic rhinitis: Secondary | ICD-10-CM | POA: Diagnosis not present

## 2024-09-15 NOTE — Progress Notes (Signed)
 VIALS MADE ON 09/15/24

## 2024-09-26 ENCOUNTER — Ambulatory Visit

## 2024-09-26 DIAGNOSIS — J309 Allergic rhinitis, unspecified: Secondary | ICD-10-CM | POA: Diagnosis not present

## 2024-10-19 ENCOUNTER — Ambulatory Visit: Admitting: Podiatry

## 2024-10-19 ENCOUNTER — Ambulatory Visit (INDEPENDENT_AMBULATORY_CARE_PROVIDER_SITE_OTHER)

## 2024-10-19 ENCOUNTER — Encounter: Payer: Self-pay | Admitting: Podiatry

## 2024-10-19 DIAGNOSIS — M722 Plantar fascial fibromatosis: Secondary | ICD-10-CM | POA: Diagnosis not present

## 2024-10-19 DIAGNOSIS — M76822 Posterior tibial tendinitis, left leg: Secondary | ICD-10-CM

## 2024-10-19 MED ORDER — TRIAMCINOLONE ACETONIDE 10 MG/ML IJ SUSP: 10.0000 mg | Freq: Once | INTRAMUSCULAR | Status: AC

## 2024-10-22 NOTE — Progress Notes (Signed)
 Subjective:   Patient ID: Brittany Riggs, female   DOB: 53 y.o.   MRN: [id number]   HPI Patient presents stating the heel has started to hurt again and I have not been here for a long time.  States that the heel has really been bothering her over the last few months does not remember injury.  Patient does not smoke does have obesity is complicating factor flatfeet and tries to be active   Review of Systems  All other systems reviewed and are negative.       Objective:  Physical Exam Vitals and nursing note reviewed.  Constitutional:      Appearance: She is well-developed.  Pulmonary:     Effort: Pulmonary effort is normal.  Musculoskeletal:        General: Normal range of motion.  Skin:    General: Skin is warm.  Neurological:     Mental Status: She is alert.     Neurovascular status intact muscle strength adequate range of motion adequate with exquisite discomfort medial fascial band left at the insertional point tendon calcaneus with fluid buildup noted around the medial band at the insertional point with flatfoot deformity noted good digital perfusion     Assessment:  Acute plantar fasciitis left with flatfoot and obesity is complicating factor     Plan:  H&P reviewed conditions discussed overall foot structure and today did sterile prep and injected the plantar fascia at insertion 3 mg Kenalog  5 mg Xylocaine  applied sterile dressing gave instructions on stretch and did dispensed night splint with all instructions on usage along with aggressive ice therapy.  Reappoint to recheck  X-rays indicate there is spur formation no indication stress fracture or significant flattening of the arch

## 2024-11-01 ENCOUNTER — Ambulatory Visit: Admitting: Podiatry

## 2024-11-02 ENCOUNTER — Encounter: Payer: Self-pay | Admitting: Podiatry

## 2024-11-02 ENCOUNTER — Ambulatory Visit: Admitting: Podiatry

## 2024-11-02 ENCOUNTER — Ambulatory Visit (INDEPENDENT_AMBULATORY_CARE_PROVIDER_SITE_OTHER): Admitting: Podiatry

## 2024-11-02 DIAGNOSIS — M722 Plantar fascial fibromatosis: Secondary | ICD-10-CM

## 2024-11-02 MED ORDER — TRIAMCINOLONE ACETONIDE 10 MG/ML IJ SUSP
10.0000 mg | Freq: Once | INTRAMUSCULAR | Status: AC
  Administered 2024-11-02: 10 mg via INTRA_ARTICULAR

## 2024-11-03 ENCOUNTER — Ambulatory Visit: Admitting: Podiatry

## 2024-11-03 NOTE — Progress Notes (Signed)
 Subjective:   Patient ID: Brittany Riggs, female   DOB: 53 y.o.   MRN: [id number]   HPI Patient states doing some better but still having pain in the heel that moderated   ROS      Objective:  Physical Exam  Neurovascular status intact with inflammation of the plantar heel left medial side still painful but reduced     Assessment:  Cute plantar fasciitis reduced left      Plan:  Sterile prep and reinjected the fascia left 3 mg Kenalog  5 mg Xylocaine  with sterile dressing applied and patient will be seen back as needed with great hope that this has reduced her symptoms significantly

## 2024-11-07 ENCOUNTER — Ambulatory Visit (INDEPENDENT_AMBULATORY_CARE_PROVIDER_SITE_OTHER)

## 2024-11-07 DIAGNOSIS — J309 Allergic rhinitis, unspecified: Secondary | ICD-10-CM | POA: Diagnosis not present

## 2024-11-16 ENCOUNTER — Ambulatory Visit

## 2024-11-16 DIAGNOSIS — J309 Allergic rhinitis, unspecified: Secondary | ICD-10-CM | POA: Diagnosis not present

## 2024-11-23 ENCOUNTER — Ambulatory Visit (INDEPENDENT_AMBULATORY_CARE_PROVIDER_SITE_OTHER): Admitting: *Deleted

## 2024-11-23 DIAGNOSIS — J309 Allergic rhinitis, unspecified: Secondary | ICD-10-CM | POA: Diagnosis not present

## 2024-12-21 ENCOUNTER — Ambulatory Visit (INDEPENDENT_AMBULATORY_CARE_PROVIDER_SITE_OTHER): Admitting: *Deleted

## 2024-12-21 DIAGNOSIS — J302 Other seasonal allergic rhinitis: Secondary | ICD-10-CM | POA: Diagnosis not present

## 2024-12-21 DIAGNOSIS — J309 Allergic rhinitis, unspecified: Secondary | ICD-10-CM

## 2025-01-17 ENCOUNTER — Ambulatory Visit: Admitting: Allergy

## 2025-02-14 ENCOUNTER — Ambulatory Visit: Admitting: Allergy
